# Patient Record
Sex: Female | Born: 1945 | Race: White | Hispanic: No | Smoking: Never smoker
Health system: Southern US, Community
[De-identification: ages and names within clinical notes are randomized; demographics above are authoritative.]

## PROBLEM LIST (undated history)

## (undated) DIAGNOSIS — I499 Cardiac arrhythmia, unspecified: Secondary | ICD-10-CM

## (undated) DIAGNOSIS — M1990 Unspecified osteoarthritis, unspecified site: Secondary | ICD-10-CM

## (undated) DIAGNOSIS — I10 Essential (primary) hypertension: Secondary | ICD-10-CM

## (undated) DIAGNOSIS — D649 Anemia, unspecified: Secondary | ICD-10-CM

## (undated) DIAGNOSIS — F419 Anxiety disorder, unspecified: Secondary | ICD-10-CM

## (undated) DIAGNOSIS — H269 Unspecified cataract: Secondary | ICD-10-CM

## (undated) DIAGNOSIS — M810 Age-related osteoporosis without current pathological fracture: Secondary | ICD-10-CM

## (undated) HISTORY — PX: TONSILLECTOMY: SUR1361

## (undated) HISTORY — DX: Anemia, unspecified: D64.9

## (undated) HISTORY — DX: Unspecified cataract: H26.9

## (undated) HISTORY — DX: Unspecified osteoarthritis, unspecified site: M19.90

## (undated) HISTORY — DX: Essential (primary) hypertension: I10

## (undated) HISTORY — DX: Cardiac arrhythmia, unspecified: I49.9

## (undated) HISTORY — PX: ABDOMINAL HYSTERECTOMY: SHX81

## (undated) HISTORY — PX: APPENDECTOMY: SHX54

## (undated) HISTORY — DX: Age-related osteoporosis without current pathological fracture: M81.0

## (undated) HISTORY — DX: Anxiety disorder, unspecified: F41.9

## (undated) HISTORY — PX: KNEE ARTHROSCOPY: SUR90

---

## 2010-08-01 MED ORDER — TRIAMCINOLONE ACETONIDE-L.S.B. 0.1 % TOPICAL OINTMENT
0.1 % | CUTANEOUS | Status: DC
Start: 2010-08-01 — End: 2013-07-30

## 2010-08-01 NOTE — ED Provider Notes (Signed)
Patient is a 65 y.o. female presenting with Poison Ivy. The history is provided by the patient.   Poison Ivy/Poison Oak/Poison [place]ac Exposure   This is a new problem. The current episode started yesterday. The problem has been gradually worsening. The problem is associated with nothing. There has been no fever. The rash is present on the left wrist and right wrist. The pain is at a severity of 7/10. The pain is moderate. The pain has been constant since onset. Associated symptoms include blisters and itching. Treatments tried: benadryl. The treatment provided mild relief.        Past Medical History   Diagnosis Date   ??? Hypertension         Past Surgical History   Procedure Date   ??? Hx gyn      hysterectomy         No family history on file.     History     Social History   ??? Marital Status: Married     Spouse Name: N/A     Number of Children: N/A   ??? Years of Education: N/A     Occupational History   ??? Not on file.     Social History Main Topics   ??? Smoking status: Never Smoker    ??? Smokeless tobacco: Not on file   ??? Alcohol Use: Yes   ??? Drug Use:    ??? Sexually Active:      Other Topics Concern   ??? Not on file     Social History Narrative   ??? No narrative on file                  ALLERGIES: Other and Sulfa(sulfonamide antibiotics)      Review of Systems   Constitutional: Negative.  Negative for fever and chills.   HENT: Negative.  Negative for ear pain, congestion, sore throat, rhinorrhea, sneezing, drooling, mouth sores, trouble swallowing, neck pain, neck stiffness, voice change, postnasal drip and sinus pressure.    Eyes: Negative.  Negative for pain and redness.   Respiratory: Negative.  Negative for cough, chest tightness and shortness of breath.    Cardiovascular: Negative.  Negative for chest pain, palpitations and leg swelling.   Gastrointestinal: Negative.  Negative for nausea, vomiting, abdominal pain, diarrhea and constipation.   Genitourinary: Negative.  Negative for dysuria and hematuria.    Musculoskeletal: Negative.  Negative for myalgias and arthralgias.   Skin: Positive for itching and rash.   Neurological: Negative.  Negative for dizziness, light-headedness and headaches.   Psychiatric/Behavioral: Negative.    [all other systems reviewed and are negative        Filed Vitals:    08/01/10 1127   BP: 146/86   Pulse: 68   Temp: 97.5 ??F (36.4 ??C)   Resp: 20   Height: 5' 3.5" (1.613 m)   Weight: 120 lb (54.432 kg)   SpO2: 99%            Physical Exam   [nursing notereviewed.  Constitutional: She is oriented to person, place, and time. She appears well-developed and well-nourished. No distress.   HENT:   Head: Normocephalic and atraumatic.   Eyes: Conjunctivae are normal.   Neck: Normal range of motion. Neck supple.   Cardiovascular: Normal rate, regular rhythm, normal heart sounds and intact distal pulses.    Pulmonary/Chest: Effort normal and breath sounds normal. No respiratory distress.   Abdominal: Soft. Bowel sounds are normal. There is no tenderness.  Musculoskeletal: Normal range of motion.   Neurological: She is alert and oriented to person, place, and time.   Skin: Skin is warm and dry. She is not diaphoretic.        Psychiatric: She has a normal mood and affect. Her behavior is normal.        MDM     Amount and/or Complexity of Data Reviewed:   Discussion of test results with the performing providers:  No   Decide to obtain previous medical records or to obtain history from someone other than the patient:  No   Obtain history from someone other than the patient:  No   Review and summarize past medical records:  No   Discuss the patient with another provider:  No   Independant visualization of image, tracing, or specimen:  No  Risk of Significant Complications, Morbidity, and/or Mortality:   Presenting problems:  [low  Diagnostic procedures:  [low  Management options:  [low  Progress:   Patient progress:  [stable      Procedures

## 2010-08-01 NOTE — ED Notes (Signed)
Pt given discharge instructions and prescriptions; verbalized understanding; e-signed out; ambulated out of ER

## 2013-07-30 NOTE — ED Notes (Signed)
Patient states she felt a flutter in her chest, has had periods of a-fib in the past with rvr. Patient denied SOB, C/P, or N/V.

## 2013-07-30 NOTE — ED Notes (Signed)
Patient continues to feel better than on admission. The fluttering is now gone, patient did state that she felt a little dizzy but it wasn't bad.

## 2013-07-30 NOTE — ED Notes (Signed)
Nursing supervisor is going to bring the medication down from ICU pxyis.

## 2013-07-30 NOTE — ED Notes (Signed)
I have reviewed discharge instructions with the patient.  The spouse verbalized understanding.

## 2013-07-30 NOTE — ED Notes (Signed)
Patient was given prescribed dose of diltiazem, heart rate decreased without patient symptoms. Repeat EKG ran with new rate.

## 2013-07-30 NOTE — ED Notes (Signed)
Pt converted after Cardizem.   EKG: NSR, 81, NAP.   Case discussed with Dr. Daleen Snookebe, rec: Xarelto and call office for follow up

## 2013-07-30 NOTE — ED Provider Notes (Signed)
HPI Comments: Pt reports episodes of "fluttering" heart beats that have been occurring about once a month for the past several years, but not lasting more than 24 hours. Her most recent episode before today was 2 days ago when she was feeling fatigued all day and collapsed suddenly while moving a mattress with her husband.  She was fine yesterday and then noted the irregular heartbeat return today.  It has lasted all day long and then this evening she had a brief syncopal episode while standing in the kitchen.  This was witnessed by her husband and there was possibly only transient LOC. She denies chest pain, dyspnea, fever or Hx of thyroid problems.     Patient is a 68 y.o. female presenting with palpitations. The history is provided by the patient.   Palpitations   This is a recurrent problem. The current episode started 12 to 24 hours ago. The problem has not changed since onset.The problem occurs constantly. The problem is associated with an unknown factor. Associated symptoms include malaise/fatigue, irregular heartbeat and syncope. Pertinent negatives include no diaphoresis, no fever, no numbness, no chest pain, no chest pressure, no claudication, no exertional chest pressure, no near-syncope, no orthopnea, no PND, no abdominal pain, no nausea, no vomiting, no headaches, no back pain, no leg pain, no lower extremity edema, no dizziness, no weakness, no cough, no hemoptysis, no shortness of breath and no sputum production. Risk factors include hypertension. She has tried nothing for the symptoms. The treatment provided no relief. Her past medical history is significant for hypertension. Her past medical history does not include hyperthyroidism or heart disease.        Past Medical History   Diagnosis Date   ??? Hypertension    ??? Arthritis         Past Surgical History   Procedure Laterality Date   ??? Hx gyn       hysterectomy   ??? Hx tonsillectomy           History reviewed. No pertinent family history.     History      Social History   ??? Marital Status: MARRIED     Spouse Name: N/A     Number of Children: N/A   ??? Years of Education: N/A     Occupational History   ??? Not on file.     Social History Main Topics   ??? Smoking status: Never Smoker    ??? Smokeless tobacco: Not on file   ??? Alcohol Use: Yes      Comment: wine qd   ??? Drug Use: No   ??? Sexual Activity: Not on file     Other Topics Concern   ??? Not on file     Social History Narrative                  ALLERGIES: Other medication and Sulfa (sulfonamide antibiotics)      Review of Systems   Constitutional: Positive for malaise/fatigue. Negative for fever and diaphoresis.   Respiratory: Negative for cough, hemoptysis, sputum production and shortness of breath.    Cardiovascular: Positive for palpitations and syncope. Negative for chest pain, orthopnea, claudication, PND and near-syncope.   Gastrointestinal: Negative for nausea, vomiting and abdominal pain.   Musculoskeletal: Negative for back pain.   Neurological: Negative for dizziness, weakness, numbness and headaches.   All other systems reviewed and are negative.      Filed Vitals:    07/30/13 2038   BP: 163/103  Pulse: 121   Temp: 97.7 ??F (36.5 ??C)   Resp: 20   Height: 5' 3.5" (1.613 m)   Weight: 51.71 kg (114 lb)   SpO2: 98%            Physical Exam   Constitutional: She is oriented to person, place, and time. She appears well-developed and well-nourished. No distress.   HENT:   Head: Normocephalic and atraumatic.   Mouth/Throat: No oropharyngeal exudate.   Eyes: Conjunctivae are normal. Pupils are equal, round, and reactive to light.   Neck: Normal range of motion. Neck supple.   Cardiovascular: Normal rate, regular rhythm and normal heart sounds.    Pulmonary/Chest: Effort normal and breath sounds normal.   Abdominal: Soft. Bowel sounds are normal.   Musculoskeletal: Normal range of motion. She exhibits no edema or tenderness.   Neurological: She is alert and oriented to person, place, and time.   Skin: Skin is warm and  dry.   Psychiatric: She has a normal mood and affect. Her behavior is normal.   Nursing note and vitals reviewed.       MDM  Number of Diagnoses or Management Options  Diagnosis management comments: EKG: Atrial fibrillation, RVR; 133; no STEMI       Amount and/or Complexity of Data Reviewed  Clinical lab tests: ordered and reviewed  Tests in the radiology section of CPT??: ordered and reviewed  Review and summarize past medical records: yes  Discuss the patient with other providers: yes  Independent visualization of images, tracings, or specimens: yes    Risk of Complications, Morbidity, and/or Mortality  Presenting problems: high  Diagnostic procedures: high  Management options: high    Patient Progress  Patient progress: improved      Procedures

## 2013-07-31 LAB — METABOLIC PANEL, COMPREHENSIVE
A-G Ratio: 1.5 (ref 1.2–3.5)
ALT (SGPT): 24 U/L (ref 12–65)
AST (SGOT): 18 U/L (ref 15–37)
Albumin: 4.3 g/dL (ref 3.2–4.6)
Alk. phosphatase: 51 U/L (ref 50–136)
Anion gap: 10 mmol/L (ref 7–16)
BUN: 9 MG/DL (ref 8–23)
Bilirubin, total: 0.3 MG/DL (ref 0.2–1.1)
CO2: 30 mmol/L (ref 21–32)
Calcium: 8.8 MG/DL (ref 8.3–10.4)
Chloride: 100 mmol/L (ref 98–107)
Creatinine: 0.6 MG/DL (ref 0.6–1.0)
GFR est AA: >60 mL/min/{1.73_m2} (ref >60)
GFR est non-AA: >60 mL/min/{1.73_m2} (ref >60)
Globulin: 2.8 g/dL (ref 2.3–3.5)
Glucose: 100 mg/dL (ref 65–100)
Potassium: 3.9 mmol/L (ref 3.5–5.1)
Protein, total: 7.1 g/dL (ref 6.3–8.2)
Sodium: 140 mmol/L (ref 136–145)

## 2013-07-31 LAB — EKG, 12 LEAD, INITIAL
Atrial Rate: 267 {beats}/min
Atrial Rate: 81 {beats}/min
Calculated P Axis: 38 degrees
Calculated R Axis: 73 degrees
Calculated R Axis: 72 degrees
Calculated T Axis: 63 degrees
Calculated T Axis: 64 degrees
P-R Interval: 186 ms
Q-T Interval: 270 ms
Q-T Interval: 348 ms
QRS Duration: 74 ms
QRS Duration: 74 ms
QTC Calculation (Bezet): 401 ms
QTC Calculation (Bezet): 404 ms
Ventricular Rate: 133 {beats}/min
Ventricular Rate: 81 {beats}/min

## 2013-07-31 LAB — MAGNESIUM: Magnesium: 2.1 mg/dL (ref 1.8–2.4)

## 2013-07-31 LAB — CBC WITH AUTOMATED DIFF
ABS. BASOPHILS: 0.1 10*3/uL (ref 0.0–0.2)
ABS. EOSINOPHILS: 0.1 10*3/uL (ref 0.0–0.8)
ABS. IMM. GRANS.: 0 10*3/uL (ref 0.0–0.5)
ABS. LYMPHOCYTES: 1.7 10*3/uL (ref 0.5–4.6)
ABS. MONOCYTES: 0.8 10*3/uL (ref 0.1–1.3)
ABS. NEUTROPHILS: 4.9 10*3/uL (ref 1.7–8.2)
BASOPHILS: 1 % (ref 0.0–2.0)
EOSINOPHILS: 1 % (ref 0.5–7.8)
HCT: 37.8 % (ref 35.8–46.3)
HGB: 12.8 g/dL (ref 11.7–15.4)
IMMATURE GRANULOCYTES: 0.1 % (ref 0.0–5.0)
LYMPHOCYTES: 23 % (ref 13–44)
MCH: 30.5 PG (ref 26.1–32.9)
MCHC: 33.9 g/dL (ref 31.4–35.0)
MCV: 90 FL (ref 79.6–97.8)
MONOCYTES: 10 % (ref 4.0–12.0)
MPV: 9.7 FL — ABNORMAL LOW (ref 10.8–14.1)
NEUTROPHILS: 65 % (ref 43–78)
PLATELET: 270 10*3/uL (ref 150–450)
RBC: 4.2 M/uL (ref 4.05–5.25)
RDW: 13 % (ref 11.9–14.6)
WBC: 7.5 10*3/uL (ref 4.3–11.1)

## 2013-07-31 LAB — POC TROPONIN: Troponin-I (POC): 0.01 ng/ml (ref 0.0–0.08)

## 2013-07-31 LAB — TSH 3RD GENERATION: TSH: 4.453 u[IU]/mL — ABNORMAL HIGH (ref 0.358–3.740)

## 2013-07-31 MED ORDER — RIVAROXABAN 10 MG TAB
10 mg | ORAL_TABLET | Freq: Every day | ORAL | Status: DC
Start: 2013-07-31 — End: 2013-08-13

## 2013-07-31 MED ORDER — DILTIAZEM HCL 5 MG/ML IV SOLN
5 mg/mL | INTRAVENOUS | Status: AC
Start: 2013-07-31 — End: 2013-07-30
  Administered 2013-07-31: 02:00:00 via INTRAVENOUS

## 2013-07-31 MED ORDER — SODIUM CHLORIDE 0.9 % IJ SYRG
INTRAMUSCULAR | Status: DC | PRN
Start: 2013-07-31 — End: 2013-07-31

## 2013-07-31 MED ORDER — ASPIRIN 325 MG TAB
325 mg | ORAL | Status: AC
Start: 2013-07-31 — End: 2013-07-30
  Administered 2013-07-31: 02:00:00 via ORAL

## 2013-07-31 MED ADMIN — rivaroxaban (XARELTO) tablet 15 mg: ORAL | @ 03:00:00 | NDC 50458057810

## 2013-07-31 MED FILL — XARELTO 15 MG TABLET: 15 mg | ORAL | Qty: 1

## 2013-07-31 MED FILL — ASPIRIN 325 MG TAB: 325 mg | ORAL | Qty: 1

## 2013-07-31 MED FILL — DILTIAZEM HCL 5 MG/ML IV SOLN: 5 mg/mL | INTRAVENOUS | Qty: 10

## 2013-08-05 LAB — CBC WITH AUTOMATED DIFF
ABS. BASOPHILS: 0 10*3/uL (ref 0.0–0.2)
ABS. EOSINOPHILS: 0.1 10*3/uL (ref 0.0–0.8)
ABS. IMM. GRANS.: 0 10*3/uL (ref 0.0–0.5)
ABS. LYMPHOCYTES: 1.7 10*3/uL (ref 0.5–4.6)
ABS. MONOCYTES: 0.7 10*3/uL (ref 0.1–1.3)
ABS. NEUTROPHILS: 5.5 10*3/uL (ref 1.7–8.2)
BASOPHILS: 1 % (ref 0.0–2.0)
EOSINOPHILS: 1 % (ref 0.5–7.8)
HCT: 37.9 % (ref 35.8–46.3)
HGB: 13 g/dL (ref 11.7–15.4)
IMMATURE GRANULOCYTES: 0.4 % (ref 0.0–5.0)
LYMPHOCYTES: 21 % (ref 13–44)
MCH: 30.9 PG (ref 26.1–32.9)
MCHC: 34.3 g/dL (ref 31.4–35.0)
MCV: 90 FL (ref 79.6–97.8)
MONOCYTES: 9 % (ref 4.0–12.0)
MPV: 10.2 FL — ABNORMAL LOW (ref 10.8–14.1)
NEUTROPHILS: 68 % (ref 43–78)
PLATELET: 258 10*3/uL (ref 150–450)
RBC: 4.21 M/uL (ref 4.05–5.25)
RDW: 12.7 % (ref 11.9–14.6)
WBC: 8.1 10*3/uL (ref 4.3–11.1)

## 2013-08-05 LAB — METABOLIC PANEL, COMPREHENSIVE
A-G Ratio: 1.5 (ref 1.2–3.5)
ALT (SGPT): 25 U/L (ref 12–65)
AST (SGOT): 21 U/L (ref 15–37)
Albumin: 4.7 g/dL — ABNORMAL HIGH (ref 3.2–4.6)
Alk. phosphatase: 57 U/L (ref 50–136)
Anion gap: 10 mmol/L (ref 7–16)
BUN: 8 MG/DL (ref 8–23)
Bilirubin, total: 0.4 MG/DL (ref 0.2–1.1)
CO2: 30 mmol/L (ref 21–32)
Calcium: 9.3 MG/DL (ref 8.3–10.4)
Chloride: 95 mmol/L — ABNORMAL LOW (ref 98–107)
Creatinine: 0.8 MG/DL (ref 0.6–1.0)
GFR est AA: >60 mL/min/{1.73_m2} (ref >60)
GFR est non-AA: >60 mL/min/{1.73_m2} (ref >60)
Globulin: 3.1 g/dL (ref 2.3–3.5)
Glucose: 105 mg/dL — ABNORMAL HIGH (ref 65–100)
Potassium: 3.3 mmol/L — ABNORMAL LOW (ref 3.5–5.1)
Protein, total: 7.8 g/dL (ref 6.3–8.2)
Sodium: 135 mmol/L — ABNORMAL LOW (ref 136–145)

## 2013-08-05 LAB — POC TROPONIN: Troponin-I (POC): 0 ng/ml (ref 0.0–0.08)

## 2013-08-05 LAB — EKG, 12 LEAD, INITIAL
Atrial Rate: 72 {beats}/min
Calculated P Axis: 54 degrees
Calculated R Axis: 65 degrees
Calculated T Axis: 67 degrees
P-R Interval: 156 ms
Q-T Interval: 386 ms
QRS Duration: 76 ms
QTC Calculation (Bezet): 422 ms
Ventricular Rate: 72 {beats}/min

## 2013-08-05 LAB — PROTHROMBIN TIME + INR
INR: 1.1 (ref 0.9–1.2)
Prothrombin time: 11.3 s (ref 9.6–12.0)

## 2013-08-05 LAB — PTT: aPTT: 30.6 s (ref 25.3–32.9)

## 2013-08-05 MED ORDER — METOPROLOL TARTRATE 50 MG TAB
50 mg | ORAL_TABLET | Freq: Two times a day (BID) | ORAL | Status: DC
Start: 2013-08-05 — End: 2013-08-13

## 2013-08-05 MED ORDER — SODIUM CHLORIDE 0.9 % IJ SYRG
INTRAMUSCULAR | Status: DC | PRN
Start: 2013-08-05 — End: 2013-08-05

## 2013-08-05 NOTE — ED Provider Notes (Addendum)
HPI Comments: Leah Mueller with history of paroxysmal atrial fib off and on for possibly a year. Leah Mueller was seen on the 6th here and the diagnosis was made and Leah Mueller followed up with Dr Roseanne RenoStewart and had an echocardiogram this week and is scheduled for a stress test next week. He reduced her metoprolol from 50/12.5 BID to 25mg  BID. Today Leah Mueller was sitting reading and the irregular heartbeat started again and by the time ems arrived it stopped and in the ed Leah Mueller is in SR. Leah Mueller is on xeralto.    Patient is a 68 y.o. female presenting with palpitations. The history is provided by the patient and the spouse.   Palpitations          Past Medical History   Diagnosis Date   ??? Hypertension    ??? Arthritis         Past Surgical History   Procedure Laterality Date   ??? Hx gyn       hysterectomy   ??? Hx tonsillectomy           History reviewed. No pertinent family history.     History     Social History   ??? Marital Status: MARRIED     Spouse Name: N/A     Number of Children: N/A   ??? Years of Education: N/A     Occupational History   ??? Not on file.     Social History Main Topics   ??? Smoking status: Never Smoker    ??? Smokeless tobacco: Not on file   ??? Alcohol Use: Yes      Comment: wine qd   ??? Drug Use: No   ??? Sexual Activity: Not on file     Other Topics Concern   ??? Not on file     Social History Narrative                  ALLERGIES: Other medication and Sulfa (sulfonamide antibiotics)      Review of Systems   Cardiovascular: Positive for palpitations.   All other systems reviewed and are negative.      Filed Vitals:    08/05/13 1225   BP: 167/88   Pulse: 74   Temp: 97.8 ??F (36.6 ??C)   Resp: 20   Height: 5' 3.5" (1.613 m)   Weight: 50.803 kg (112 lb)   SpO2: 99%            Physical Exam   Constitutional: Leah Mueller is oriented to person, place, and time. Leah Mueller appears well-developed and well-nourished.   HENT:   Head: Normocephalic and atraumatic.   Right Ear: External ear normal.   Left Ear: External ear normal.   Nose: Nose normal.   Mouth/Throat:  Oropharynx is clear and moist.   Eyes: Conjunctivae and EOM are normal. Pupils are equal, round, and reactive to light. Right eye exhibits no discharge. Left eye exhibits no discharge.   Neck: Normal range of motion. Neck supple. No thyromegaly present.   Cardiovascular: Normal rate, regular rhythm, normal heart sounds and intact distal pulses.  Exam reveals no gallop and no friction rub.    No murmur heard.  Pulmonary/Chest: Effort normal and breath sounds normal. No respiratory distress. Leah Mueller has no wheezes. Leah Mueller has no rales. Leah Mueller exhibits no tenderness.   Abdominal: Soft. Leah Mueller exhibits no distension. There is no tenderness.   Musculoskeletal: Normal range of motion. Leah Mueller exhibits no edema or tenderness.   Neurological: Leah Mueller is alert and oriented to  person, place, and time. Leah Mueller displays normal reflexes. No cranial nerve deficit. Leah Mueller exhibits normal muscle tone. Coordination normal.   Skin: Skin is warm and dry.   Nursing note and vitals reviewed.       MDM    Procedures

## 2013-08-05 NOTE — ED Notes (Signed)
I have reviewed discharge instructions with the patient.  The patient verbalized understanding. Patient was given prescription. Pt ambulatory upon discharge home.

## 2013-08-05 NOTE — ED Notes (Signed)
I discussed with Dr Theressa MillardNessmith . Will increase the metoprolol th 50mg  BID. The patient will monitor the blood pressure.

## 2013-08-13 NOTE — ED Notes (Signed)
Alert and skin warm and dry, no complaints of chest pain or shortness of breath, husband at bedside

## 2013-08-13 NOTE — ED Notes (Signed)
Alert and skin warm and dry, Resting quietly and currently no complaints of chest pain or shortness of breath, IV site patent and husband at bedside

## 2013-08-13 NOTE — ED Provider Notes (Signed)
HPI Comments: [age]y female with newly onset afib having RVR which started at noon. She denies CP or SOB. She had been on Metoprolol initially but it was stopped. She is on Eliquis and HCTZ.    Patient is a 68 y.o. female presenting with palpitations. The history is provided by the patient.   Palpitations   This is a recurrent problem. The current episode started 6 to 12 hours ago. The problem has not changed since onset.The problem occurs constantly. The problem is associated with nothing. Associated symptoms include irregular heartbeat. Pertinent negatives include no diaphoresis, no fever, no malaise/fatigue, no numbness, no chest pain, no chest pressure, no exertional chest pressure, no orthopnea, no syncope, no abdominal pain, no vomiting, no back pain, no dizziness, no cough and no sputum production. Risk factors include cardiac disease. She has tried nothing for the symptoms. Her past medical history is significant for atrial fibrillation and SVT. Her past medical history does not include hyperthyroidism or valve disorder.        Past Medical History   Diagnosis Date   ??? Hypertension    ??? Arthritis    ??? Ill-defined condition         Past Surgical History   Procedure Laterality Date   ??? Hx gyn       hysterectomy   ??? Hx tonsillectomy           No family history on file.     History     Social History   ??? Marital Status: MARRIED     Spouse Name: N/A     Number of Children: N/A   ??? Years of Education: N/A     Occupational History   ??? Not on file.     Social History Main Topics   ??? Smoking status: Never Smoker    ??? Smokeless tobacco: Not on file   ??? Alcohol Use: Yes      Comment: wine qd   ??? Drug Use: No   ??? Sexual Activity: Not on file     Other Topics Concern   ??? Not on file     Social History Narrative                  ALLERGIES: Other medication and Sulfa (sulfonamide antibiotics)      Review of Systems   Constitutional: Negative.  Negative for fever, malaise/fatigue, diaphoresis and activity change.   HENT:  Negative.    Eyes: Negative.    Respiratory: Negative.  Negative for cough and sputum production.    Cardiovascular: Positive for palpitations. Negative for chest pain, orthopnea and syncope.   Gastrointestinal: Negative.  Negative for vomiting and abdominal pain.   Genitourinary: Negative.    Musculoskeletal: Negative.  Negative for back pain.   Skin: Negative.    Neurological: Negative.  Negative for dizziness and numbness.   Psychiatric/Behavioral: Negative.    All other systems reviewed and are negative.      Filed Vitals:    08/13/13 2022 08/13/13 2038   BP: 158/94 159/90   Pulse: 118 129   Temp: 98.1 ??F (36.7 ??C)    Resp: 16 16   Height: 5' 3.5" (1.613 m)    Weight: 49.896 kg (110 lb)    SpO2: 96% 100%            Physical Exam   Constitutional: She is oriented to person, place, and time. She appears well-developed and well-nourished. No distress.   HENT:   Head: Normocephalic  and atraumatic.   Right Ear: External ear normal.   Left Ear: External ear normal.   Nose: Nose normal.   Mouth/Throat: Oropharynx is clear and moist. No oropharyngeal exudate.   Eyes: Conjunctivae and EOM are normal. Pupils are equal, round, and reactive to light. Right eye exhibits no discharge. Left eye exhibits no discharge. No scleral icterus.   Neck: Normal range of motion. Neck supple. No JVD present. No tracheal deviation present.   Cardiovascular: Intact distal pulses.  An irregularly irregular rhythm present. Tachycardia present.    No murmur heard.  Pulmonary/Chest: Effort normal and breath sounds normal. No stridor. No respiratory distress. She has no wheezes. She exhibits no tenderness.   Abdominal: Soft. Bowel sounds are normal. She exhibits no distension and no mass. There is no tenderness.   Musculoskeletal: Normal range of motion. She exhibits no edema or tenderness.   Neurological: She is alert and oriented to person, place, and time. No cranial nerve deficit.   Skin: Skin is warm and dry. No rash noted. She is not  diaphoretic. No erythema. No pallor.   Psychiatric: She has a normal mood and affect. Her behavior is normal. Thought content normal.   Nursing note and vitals reviewed.       MDM  Number of Diagnoses or Management Options     Amount and/or Complexity of Data Reviewed  Clinical lab tests: ordered and reviewed  Tests in the radiology section of CPT??: ordered and reviewed  Tests in the medicine section of CPT??: ordered and reviewed        Procedures

## 2013-08-14 LAB — POC TROPONIN
Troponin-I (POC): 0.03 ng/ml (ref 0.0–0.08)
Troponin-I (POC): 0.02 ng/ml (ref 0.0–0.08)

## 2013-08-14 LAB — CBC WITH AUTOMATED DIFF
ABS. BASOPHILS: 0 10*3/uL (ref 0.0–0.2)
ABS. EOSINOPHILS: 0.1 10*3/uL (ref 0.0–0.8)
ABS. IMM. GRANS.: 0 10*3/uL (ref 0.0–0.5)
ABS. LYMPHOCYTES: 2.1 10*3/uL (ref 0.5–4.6)
ABS. MONOCYTES: 0.9 10*3/uL (ref 0.1–1.3)
ABS. NEUTROPHILS: 5.9 10*3/uL (ref 1.7–8.2)
BASOPHILS: 0 % (ref 0.0–2.0)
EOSINOPHILS: 1 % (ref 0.5–7.8)
HCT: 39.8 % (ref 35.8–46.3)
HGB: 13.9 g/dL (ref 11.7–15.4)
IMMATURE GRANULOCYTES: 0.2 % (ref 0.0–5.0)
LYMPHOCYTES: 23 % (ref 13–44)
MCH: 30.9 PG (ref 26.1–32.9)
MCHC: 34.9 g/dL (ref 31.4–35.0)
MCV: 88.4 FL (ref 79.6–97.8)
MONOCYTES: 10 % (ref 4.0–12.0)
MPV: 10.4 FL — ABNORMAL LOW (ref 10.8–14.1)
NEUTROPHILS: 66 % (ref 43–78)
PLATELET: 299 10*3/uL (ref 150–450)
RBC: 4.5 M/uL (ref 4.05–5.25)
RDW: 12.6 % (ref 11.9–14.6)
WBC: 9.1 10*3/uL (ref 4.3–11.1)

## 2013-08-14 LAB — MAGNESIUM: Magnesium: 1.9 mg/dL (ref 1.8–2.4)

## 2013-08-14 LAB — METABOLIC PANEL, COMPREHENSIVE
A-G Ratio: 1.3 (ref 1.2–3.5)
ALT (SGPT): 22 U/L (ref 12–65)
AST (SGOT): 26 U/L (ref 15–37)
Albumin: 4.5 g/dL (ref 3.2–4.6)
Alk. phosphatase: 56 U/L (ref 50–136)
Anion gap: 11 mmol/L (ref 7–16)
BUN: 5 MG/DL — ABNORMAL LOW (ref 8–23)
Bilirubin, total: 0.5 MG/DL (ref 0.2–1.1)
CO2: 28 mmol/L (ref 21–32)
Calcium: 8.5 MG/DL (ref 8.3–10.4)
Chloride: 94 mmol/L — ABNORMAL LOW (ref 98–107)
Creatinine: 0.6 MG/DL (ref 0.6–1.0)
GFR est AA: >60 mL/min/{1.73_m2} (ref >60)
GFR est non-AA: >60 mL/min/{1.73_m2} (ref >60)
Globulin: 3.4 g/dL (ref 2.3–3.5)
Glucose: 106 mg/dL — ABNORMAL HIGH (ref 65–100)
Potassium: 3.9 mmol/L (ref 3.5–5.1)
Protein, total: 7.9 g/dL (ref 6.3–8.2)
Sodium: 133 mmol/L — ABNORMAL LOW (ref 136–145)

## 2013-08-14 LAB — EKG, 12 LEAD, INITIAL
Atrial Rate: 153 {beats}/min
Calculated P Axis: 45 degrees
Calculated R Axis: 82 degrees
Calculated T Axis: 52 degrees
P-R Interval: 174 ms
Q-T Interval: 328 ms
QRS Duration: 78 ms
QTC Calculation (Bezet): 484 ms
Ventricular Rate: 131 {beats}/min

## 2013-08-14 LAB — BNP: BNP: 79 pg/mL

## 2013-08-14 LAB — PTT: aPTT: 29.5 s (ref 25.3–32.9)

## 2013-08-14 LAB — PROTHROMBIN TIME + INR
INR: 1 (ref 0.9–1.2)
Prothrombin time: 10.8 s (ref 9.6–12.0)

## 2013-08-14 LAB — TSH 3RD GENERATION: TSH: 4.078 u[IU]/mL — ABNORMAL HIGH (ref 0.358–3.740)

## 2013-08-14 MED ORDER — SODIUM CHLORIDE 0.9% BOLUS IV
0.9 % | Freq: Once | INTRAVENOUS | Status: AC
Start: 2013-08-14 — End: 2013-08-14
  Administered 2013-08-14: 01:00:00 via INTRAVENOUS

## 2013-08-14 MED ORDER — METOPROLOL SUCCINATE SR 50 MG 24 HR TAB
50 mg | ORAL_TABLET | Freq: Every day | ORAL | Status: AC
Start: 2013-08-14 — End: ?

## 2013-08-14 MED ORDER — METOPROLOL TARTRATE 50 MG TAB
50 mg | ORAL | Status: AC
Start: 2013-08-14 — End: 2013-08-13
  Administered 2013-08-14: 01:00:00 via ORAL

## 2013-08-14 MED FILL — METOPROLOL TARTRATE 50 MG TAB: 50 mg | ORAL | Qty: 1

## 2013-08-14 NOTE — ED Notes (Signed)
The patient and spouse was given their discharge instructions and  was given prescriptions.   The  patient verbalized understanding and had no additional questions. The patient was alert and was discharged via Ambulatory, without additional complaints at time of discharge.  No apparent distress noted

## 2014-08-20 DIAGNOSIS — E871 Hypo-osmolality and hyponatremia: Secondary | ICD-10-CM | POA: Insufficient documentation

## 2014-08-20 DIAGNOSIS — F419 Anxiety disorder, unspecified: Secondary | ICD-10-CM | POA: Insufficient documentation

## 2014-08-20 HISTORY — DX: Hypo-osmolality and hyponatremia: E87.1

## 2015-06-18 DIAGNOSIS — M810 Age-related osteoporosis without current pathological fracture: Secondary | ICD-10-CM | POA: Insufficient documentation

## 2015-11-11 DIAGNOSIS — I10 Essential (primary) hypertension: Secondary | ICD-10-CM | POA: Insufficient documentation

## 2016-09-28 ENCOUNTER — Ambulatory Visit (INDEPENDENT_AMBULATORY_CARE_PROVIDER_SITE_OTHER): Payer: Medicare Other | Admitting: Pediatrics

## 2016-09-28 ENCOUNTER — Encounter: Payer: Self-pay | Admitting: Pediatrics

## 2016-09-28 VITALS — BP 197/91 | HR 58 | Temp 97.0°F | Ht 63.0 in | Wt 113.6 lb

## 2016-09-28 DIAGNOSIS — F419 Anxiety disorder, unspecified: Secondary | ICD-10-CM | POA: Diagnosis not present

## 2016-09-28 DIAGNOSIS — E785 Hyperlipidemia, unspecified: Secondary | ICD-10-CM | POA: Diagnosis not present

## 2016-09-28 DIAGNOSIS — I10 Essential (primary) hypertension: Secondary | ICD-10-CM

## 2016-09-28 DIAGNOSIS — I4891 Unspecified atrial fibrillation: Secondary | ICD-10-CM | POA: Insufficient documentation

## 2016-09-28 MED ORDER — ALPRAZOLAM 0.25 MG PO TABS: ORAL_TABLET | ORAL | 2 refills | Status: DC

## 2016-09-28 MED ORDER — PRAVASTATIN SODIUM 20 MG PO TABS: 20.0000 mg | ORAL_TABLET | Freq: Every day | ORAL | 3 refills | Status: DC

## 2016-09-28 NOTE — Progress Notes (Signed)
Subjective:   Patient ID: Mary [name], female    DOB: Apr 28, 1945, 71 y.o.   MRN: [id number] CC: New Patient (Initial Visit) and med follow up HPI: Mary [name] is a 71 y.o. female presenting for New Patient (Initial Visit)  Here today with her husband  HTN: taking BPs at home, regulalry elevated to the 180s Has had problems with BP since her 35s Had hyponatremia with HCTZ in the past Recently valsartan doubled to 320mg  in the morning, took last night for the first time  Afib: xarelto made her sick to her stomach, eliquis was $100 a month Planning to check with insurance co about pradaxa She doesn't want the inconvenience of checking INRs with warfarin Follows with cardiology  Anxiety: taking xanax for about a year Has helped her feel more calm Was tried on sertraline initially, made her feel nauseous  Recently seen by dentist, 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 Anxiety   . Arthritis   . Hypertension   . Irregular heart beat    Family History  Problem Relation Age of Onset  . Arthritis Mother   . Heart disease Mother   . Hypertension Mother   . Hyperlipidemia Mother   . Kidney disease Mother   . Hypertension Brother   . Stroke Maternal Grandmother   . Heart disease Maternal Grandfather   . Arthritis Paternal Grandmother    Social History   Social History  . Marital status: Married    Spouse name: N/A  . Number of children: N/A  . Years of education: N/A   Social History Main Topics  . Smoking status: Never Smoker  . Smokeless tobacco: Never Used  . Alcohol use 4.2 oz/week    7 Glasses of wine per week  . Drug use: No  . Sexual activity: No   Other Topics Concern  . None   Social History Narrative  . None   ROS: All systems negative other than what is in HPI  Objective:    BP (!) 197/91   Pulse (!) 58   Temp 97 F (36.1 C) (Oral)   Ht  5\' 3"  (1.6 m)   Wt 113 lb 9.6 oz (51.5 kg)   BMI 20.12 kg/m   Wt Readings from Last 3 Encounters:  09/28/16 113 lb 9.6 oz (51.5 kg)    Gen: NAD, thin, well appearing, alert, cooperative with exam EYES: EOMI, no conjunctival injection, or no icterus ENT:  L TM injected, R Tm normal,  OP without erythema LYMPH: no cervical LAD CV: NRRR, normal S1/S2, no murmur, distal pulses 2+ b/l Resp: CTABL, no wheezes, normal WOB Abd: +BS, soft, NTND. No abd bruits, no guarding or organomegaly Ext: No edema, warm Neuro: Alert and oriented, strength equal b/l UE and LE, coordination grossly normal MSK: normal muscle bulk   Assessment & Plan:  Mary [name] was seen today for new patient (initial visit), med problem f/u.  Diagnoses and all orders for this visit:  Atrial fibrillation, unspecified type ([place]) Rhythm controlled, follows with cardiology. Ideally would be on anticoagulation, CHA2DS2/VAS at least 3, with increased risk of stroke. Pt aware. Will check pradaxa prices with insurance. Open to warfarin, pt wants to talk with insurance and think about it. Can have pt follow with clinical pharmacist here for INRs if she goes that route.  Anxiety Well controlled on below. Discussed ideally will have her on a medicine to  treat the anxiety as well. Will continue before for now, #60 tabs with 2 refills given, will cont to address. -     ALPRAZolam (XANAX) 0.25 MG tablet; TAKE 1 TABLET 2 TIMES DAILY AS NEEDED FOR ANXIETY  Hyperlipidemia, unspecified hyperlipidemia type ASCVD 21yr risk 28% with elevated BP today, still would be over 10% een with ideal BP control. Will start below -     pravastatin (PRAVACHOL) 20 MG tablet; Take 1 tablet (20 mg total) by mouth daily.  Essential hypertension Elevated. Just started extra valsartan dose last night.  Cont to check at home Let me know if stays elevated. F/u 4 weeks  Follow up plan: Return in about 4 weeks (around 10/26/2016). [name], MD [place]

## 2016-09-28 NOTE — Patient Instructions (Addendum)
Warfarin (generic) coumadin (brand name)  pradaxa  Eliquis  Starting pravastatin 20mg  at night

## 2016-10-11 ENCOUNTER — Telehealth: Payer: Self-pay | Admitting: Pediatrics

## 2016-10-11 NOTE — Telephone Encounter (Signed)
appt scheduled Pt notified 

## 2016-10-11 NOTE — Telephone Encounter (Signed)
Can you call pt to set up appt new coumadin pt appt with Tammy in the next week if possible? Pt is aware.

## 2016-10-12 ENCOUNTER — Ambulatory Visit (INDEPENDENT_AMBULATORY_CARE_PROVIDER_SITE_OTHER): Payer: Medicare Other | Admitting: Pharmacist

## 2016-10-12 VITALS — BP 148/88 | HR 60

## 2016-10-12 DIAGNOSIS — I480 Paroxysmal atrial fibrillation: Secondary | ICD-10-CM | POA: Diagnosis not present

## 2016-10-12 DIAGNOSIS — Z79899 Other long term (current) drug therapy: Secondary | ICD-10-CM

## 2016-10-12 MED ORDER — WARFARIN SODIUM 2.5 MG PO TABS: 2.5000 mg | ORAL_TABLET | Freq: Every day | ORAL | 1 refills | Status: DC

## 2016-10-12 NOTE — Progress Notes (Signed)
Subjective:     Indication: atrial fibrillation   Mary [name] is referred by Dr Evette Doffing for initiation of anticoagulation therapy.  Mary [name] has atrial fibrillation with CHADSVAsc score of 3.  She has taken Xarelto in past and caused nausea and for patient to "feel bad";  She was then switched to Eliquis and tolerated well but cost was high.  Also checked cost of Pradaxa and it too was too expensive.  Patient is currently taking ASA 325mg  qd.  She is not sure she wants to try warfarin because she is concerned with dietary restrictions. She eats kale and salads daily  Bleeding signs/symptoms: None Thromboembolic signs/symptoms: None   Current Outpatient Prescriptions:  .  ALPRAZolam (XANAX) 0.25 MG tablet, TAKE 1 TABLET 2 TIMES DAILY AS NEEDED FOR ANXIETY, Disp: 60 tablet, Rfl: 2 .  flecainide (TAMBOCOR) 100 MG tablet, Take 100 mg by mouth 2 (two) times daily. , Disp: , Rfl:  .  furosemide (LASIX) 20 MG tablet, Take 20 mg by mouth as needed. , Disp: , Rfl:  .  metoprolol succinate (TOPROL-XL) 50 MG 24 hr tablet, Take 50 mg by mouth daily. , Disp: , Rfl:  .  pravastatin (PRAVACHOL) 20 MG tablet, Take 1 tablet (20 mg total) by mouth daily., Disp: 90 tablet, Rfl: 3 .  valsartan (DIOVAN) 160 MG tablet, Take 320 mg by mouth daily. , Disp: , Rfl:  .  warfarin (COUMADIN) 2.5 MG tablet, Take 1 tablet (2.5 mg total) by mouth daily. Or as directed by anticogulation clinic, Disp: 90 tablet, Rfl: 1  Active Ambulatory Problems    Diagnosis Date Noted  . Anxiety 08/20/2014  . Atrial fibrillation ([place]) 09/28/2016  . Essential hypertension with goal blood pressure less than 140/90 11/11/2015  . Osteoporosis 06/18/2015  . Hyperlipidemia 09/28/2016   Resolved Ambulatory Problems    Diagnosis Date Noted  . No Resolved Ambulatory Problems   Past Medical History:  Diagnosis Date  . Anxiety   . Arthritis   . Hypertension   . Irregular heart beat      Objective:      Today's Vitals   10/12/16 1507  BP: (!) 148/88  Pulse: 60     Assessment:   Atrial Fibrillation with CHADSVASc score of 3 - indicating need for anitcoagulation for stroke risk reduction  Plan:    1. Patient to start warfarin 2.5mg  qd.  She wants to get form mail order and plan is to start July 1st.   2. Next INR: July 5th, 2018 when she is already scheduled to see Dr Evette Doffing.  3.  Long discussion about risks and benefits of anticoagulation therapy.    4. Patient is to keep vitamin K rich food intake consistent.     Patient ID: Mary [name], female   DOB: 04-03-1946, 71 y.o.   MRN: [id number]

## 2016-10-28 ENCOUNTER — Ambulatory Visit: Payer: Medicare Other | Admitting: Pediatrics

## 2016-11-04 ENCOUNTER — Ambulatory Visit (INDEPENDENT_AMBULATORY_CARE_PROVIDER_SITE_OTHER): Payer: Medicare Other | Admitting: Pediatrics

## 2016-11-04 ENCOUNTER — Encounter: Payer: Self-pay | Admitting: Pediatrics

## 2016-11-04 VITALS — BP 174/74 | HR 53 | Temp 97.1°F | Ht 63.0 in | Wt 113.2 lb

## 2016-11-04 DIAGNOSIS — F419 Anxiety disorder, unspecified: Secondary | ICD-10-CM | POA: Diagnosis not present

## 2016-11-04 DIAGNOSIS — I10 Essential (primary) hypertension: Secondary | ICD-10-CM | POA: Diagnosis not present

## 2016-11-04 DIAGNOSIS — I480 Paroxysmal atrial fibrillation: Secondary | ICD-10-CM | POA: Diagnosis not present

## 2016-11-04 LAB — COAGUCHEK XS/INR WAIVED
INR: 1 (ref 0.9–1.1)
Prothrombin Time: 12.1 s

## 2016-11-04 MED ORDER — ALPRAZOLAM 0.25 MG PO TABS: ORAL_TABLET | ORAL | 1 refills | Status: DC

## 2016-11-04 MED ORDER — DULOXETINE HCL 20 MG PO CPEP: 20.0000 mg | ORAL_CAPSULE | Freq: Every day | ORAL | 1 refills | Status: DC

## 2016-11-04 MED ORDER — AMLODIPINE BESYLATE 2.5 MG PO TABS: 2.5000 mg | ORAL_TABLET | Freq: Every day | ORAL | 3 refills | Status: DC

## 2016-11-04 NOTE — Progress Notes (Signed)
  Subjective:   Patient ID: Mary [name], female    DOB: Mar 08, 1946, 71 y.o.   MRN: [id number] CC: Follow-up (4 week) med problems HPI: Genna Casimir is a 71 y.o. female presenting for Follow-up (4 week)  Anxiety: ongoing symptoms, husband with MDS Drinks half caffeinated half decaf Taking xanax BID Mood is fine Had been tried on zoloft in the past, made her feel bad  Afib: does not want to be on anticoagulation, was prescribed warfarin, has decided not to take it  BP remains high at home No CP, no SOB  Relevant past medical, surgical, family and social history reviewed. Allergies and medications reviewed and updated. History  Smoking Status  . Never Smoker  Smokeless Tobacco  . Never Used   ROS: Per HPI   Objective:    BP (!) 174/74   Pulse (!) 53   Temp (!) 97.1 F (36.2 C) (Oral)   Ht 5\' 3"  (1.6 m)   Wt 113 lb 3.2 oz (51.3 kg)   BMI 20.05 kg/m   Wt Readings from Last 3 Encounters:  11/04/16 113 lb 3.2 oz (51.3 kg)  09/28/16 113 lb 9.6 oz (51.5 kg)    Gen: NAD, alert, cooperative with exam, NCAT EYES: EOMI, no conjunctival injection, or no icterus ENT:  OP without erythema LYMPH: no cervical LAD CV: NRRR, normal S1/S2, no murmur, distal pulses 2+ b/l Resp: CTABL, no wheezes, normal WOB Ext: No edema, warm Neuro: Alert and oriented, strength equal b/l UE and LE, coordination grossly normal MSK: normal muscle bulk Psych: normal affect, tearful at times, mood is fine  Assessment & Plan:  Briley was seen today for follow-up multiple med problems  Diagnoses and all orders for this visit:  Paroxysmal atrial fibrillation (HCC) Declines anticoagulation, insurance no longer pays for DOAC Pt does not want to be on warfarin due to INR checks  Anxiety Ongoing symptoms Cont xanax twice a day, Rx with refills given Start duloxetine -     ALPRAZolam (XANAX) 0.25 MG tablet; TAKE 1 TABLET 2 TIMES DAILY AS NEEDED FOR ANXIETY -     DULoxetine (CYMBALTA) 20 MG  capsule; Take 1 capsule (20 mg total) by mouth daily. -     TSH  Essential hypertension Elevated today Start amlodipine Check at home Let me know if stays elevated -     amLODipine (NORVASC) 2.5 MG tablet; Take 1 tablet (2.5 mg total) by mouth daily. -     Basic Metabolic Panel   Follow up plan: Return in about 10 weeks (around 01/13/2017). [name], MD [place]

## 2016-11-05 LAB — BASIC METABOLIC PANEL WITH GFR
BUN/Creatinine Ratio: 13 (ref 12–28)
BUN: 8 mg/dL (ref 8–27)
CO2: 23 mmol/L (ref 20–29)
Calcium: 9.2 mg/dL (ref 8.7–10.3)
Chloride: 94 mmol/L — ABNORMAL LOW (ref 96–106)
Creatinine, Ser: 0.62 mg/dL (ref 0.57–1.00)
GFR calc Af Amer: 105 mL/min/1.73 (ref >59)
GFR calc non Af Amer: 91 mL/min/1.73 (ref >59)
Glucose: 87 mg/dL (ref 65–99)
Potassium: 4.1 mmol/L (ref 3.5–5.2)
Sodium: 135 mmol/L (ref 134–144)

## 2016-11-05 LAB — TSH: TSH: 1.92 u[IU]/mL (ref 0.450–4.500)

## 2016-11-22 ENCOUNTER — Telehealth: Payer: Self-pay | Admitting: Pediatrics

## 2016-11-22 NOTE — Telephone Encounter (Signed)
Please review and advise.

## 2016-11-23 NOTE — Telephone Encounter (Signed)
Called pt back, she is going to check pricing for several possible medications with her insurance co and call us back.

## 2016-11-24 ENCOUNTER — Telehealth: Payer: Self-pay | Admitting: Pediatrics

## 2016-11-24 MED ORDER — IRBESARTAN 300 MG PO TABS: 300.0000 mg | ORAL_TABLET | Freq: Every day | ORAL | 1 refills | Status: DC

## 2016-11-24 NOTE — Telephone Encounter (Signed)
Please advise 

## 2016-11-24 NOTE — Telephone Encounter (Signed)
Patient aware.

## 2017-01-13 ENCOUNTER — Ambulatory Visit (INDEPENDENT_AMBULATORY_CARE_PROVIDER_SITE_OTHER): Payer: Medicare Other | Admitting: Pediatrics

## 2017-01-13 ENCOUNTER — Encounter: Payer: Self-pay | Admitting: Pediatrics

## 2017-01-13 VITALS — BP 160/78 | HR 55 | Temp 97.4°F | Ht 63.0 in | Wt 111.4 lb

## 2017-01-13 DIAGNOSIS — I10 Essential (primary) hypertension: Secondary | ICD-10-CM

## 2017-01-13 DIAGNOSIS — E785 Hyperlipidemia, unspecified: Secondary | ICD-10-CM

## 2017-01-13 DIAGNOSIS — L570 Actinic keratosis: Secondary | ICD-10-CM

## 2017-01-13 DIAGNOSIS — F419 Anxiety disorder, unspecified: Secondary | ICD-10-CM

## 2017-01-13 DIAGNOSIS — I480 Paroxysmal atrial fibrillation: Secondary | ICD-10-CM

## 2017-01-13 MED ORDER — ALPRAZOLAM 0.25 MG PO TABS: ORAL_TABLET | ORAL | 1 refills | Status: DC

## 2017-01-13 NOTE — Progress Notes (Signed)
  Subjective:   Patient ID: Mary [name], female    DOB: Nov 22, 1945, 71 y.o.   MRN: [id number] CC: Follow-up med problems  HPI: Mary [name] is a 71 y.o. female presenting for Follow-up  HTN: BPs at home 130s/70s Feels much better with improved control Taking meds regularly  Anxiety: not taking cymbalta Was too expensive Taking xanax 0.25 half or whole tab twice a day Usually half tab in the morning Is very hesitant about starting alternate anxiety medicines, worried about weight gain, side effects  Appetite has been good Eats mostly fruits, vegetables, does get protein in regularly  Afib: declines anticoagulation at this time Not taking aspirin but says she can restart at home  Used to get regular skin checks with dermatology, has had superficial skin cancers removed she says Wants to get back in to see dermatology  Relevant past medical, surgical, family and social history reviewed. Allergies and medications reviewed and updated. History  Smoking Status  . Never Smoker  Smokeless Tobacco  . Never Used   ROS: Per HPI   Objective:    BP (!) 160/78   Pulse (!) 55   Temp (!) 97.4 F (36.3 C) (Oral)   Ht 5\' 3"  (1.6 m)   Wt 111 lb 6.4 oz (50.5 kg)   BMI 19.73 kg/m   Wt Readings from Last 3 Encounters:  01/13/17 111 lb 6.4 oz (50.5 kg)  11/04/16 113 lb 3.2 oz (51.3 kg)  09/28/16 113 lb 9.6 oz (51.5 kg)    Gen: NAD, alert, thin, well appearing, cooperative with exam, NCAT EYES: EOMI, no conjunctival injection, or no icterus ENT:  OP without erythema LYMPH: no cervical LAD CV: NRRR, normal S1/S2, no murmur Resp: CTABL, no wheezes, normal WOB Abd: +BS, soft, NTND. no guarding or organomegaly Ext: No edema, warm Neuro: Alert and oriented, strength equal b/l UE and LE, coordination grossly normal MSK: normal muscle bulk  Assessment & Plan:  Mary [name] was seen today for follow-up med problems  Diagnoses and all orders for this visit:  Anxiety Stable on  below Very hesitant about starting SSRI, says may be open in future Cont to address as needed -     ALPRAZolam (XANAX) 0.25 MG tablet; TAKE 1 TABLET 2 TIMES DAILY AS NEEDED FOR ANXIETY  AK (actinic keratosis) -     Ambulatory referral to Dermatology  Paroxysmal atrial fibrillation ([place]) Declines anticoagulation, pt aware of stroke risk Will restart ASA 325mg  daily  Essential hypertension with goal blood pressure less than 140/90 Improving control at home Feels better with lower bps Cont to follow at home Cont meds  Hyperlipidemia, unspecified hyperlipidemia type Stable, cont pravastatin  Follow up plan: Return in about 3 months (around 04/14/2017). Mary Found, MD [place]

## 2017-01-14 ENCOUNTER — Encounter: Payer: Self-pay | Admitting: *Deleted

## 2017-02-19 ENCOUNTER — Other Ambulatory Visit: Payer: Self-pay | Admitting: Pediatrics

## 2017-04-14 ENCOUNTER — Encounter: Payer: Self-pay | Admitting: Pediatrics

## 2017-04-14 ENCOUNTER — Ambulatory Visit (INDEPENDENT_AMBULATORY_CARE_PROVIDER_SITE_OTHER): Payer: Medicare Other | Admitting: Pediatrics

## 2017-04-14 VITALS — BP 132/78 | HR 56 | Temp 97.3°F | Ht 63.0 in | Wt 111.8 lb

## 2017-04-14 DIAGNOSIS — I10 Essential (primary) hypertension: Secondary | ICD-10-CM | POA: Diagnosis not present

## 2017-04-14 DIAGNOSIS — F419 Anxiety disorder, unspecified: Secondary | ICD-10-CM

## 2017-04-14 DIAGNOSIS — M810 Age-related osteoporosis without current pathological fracture: Secondary | ICD-10-CM | POA: Diagnosis not present

## 2017-04-14 DIAGNOSIS — I4891 Unspecified atrial fibrillation: Secondary | ICD-10-CM

## 2017-04-14 MED ORDER — IRBESARTAN 300 MG PO TABS: 300.0000 mg | ORAL_TABLET | Freq: Every day | ORAL | 1 refills | Status: DC

## 2017-04-14 MED ORDER — ALPRAZOLAM 0.25 MG PO TABS: ORAL_TABLET | ORAL | 5 refills | Status: DC

## 2017-04-14 MED ORDER — ALPRAZOLAM 0.25 MG PO TABS: ORAL_TABLET | ORAL | 4 refills | Status: DC

## 2017-04-14 NOTE — Progress Notes (Signed)
  Subjective:   Patient ID: Mary [name], female    DOB: [birth year], 71 y.o.   MRN: [id number] CC: Follow-up (3 month) multiple med problems HPI: Mary [name] is a 71 y.o. female presenting for Follow-up (3 month)  Afib: pt denies any palpitations, heart racing Not on anticoagulation due to pt preference Sometimes taking aspirin Following with cardiology  Osteoporosis: was started on bisphosphonate in the past, stopped taking it after several months bc of constipation  Hypertension: no HA, no CP, she does not check her BP regularly but says she can tell when her BP is up She thinks improved with amlodipine No lightheadedness  Anxiety: taking xanax once sometimes twice a day to help with stress and anxiety Mary [name], was put on hospice for 10 days last month after a fall, he improved, is with her today in clinic though still very ill Ongoing stress at home with above  HLD: on statin, no s/e  Staying active  Relevant past medical, surgical, family and social history reviewed. Allergies and medications reviewed and updated. Social History   Tobacco Use  Smoking Status Never Smoker  Smokeless Tobacco Never Used   ROS: Per HPI   Objective:    BP 132/78   Pulse (!) 56   Temp (!) 97.3 F (36.3 C) (Oral)   Ht 5\' 3"  (1.6 m)   Wt 111 lb 12.8 oz (50.7 kg)   BMI 19.80 kg/m   Wt Readings from Last 3 Encounters:  04/14/17 111 lb 12.8 oz (50.7 kg)  01/13/17 111 lb 6.4 oz (50.5 kg)  11/04/16 113 lb 3.2 oz (51.3 kg)    Gen: NAD, alert, thin, well-dressed, cooperative with exam, NCAT EYES: EOMI, no conjunctival injection, or no icterus ENT:   OP without erythema LYMPH: no cervical LAD CV: NRRR, normal S1/S2, no murmur, distal pulses 2+ b/l Resp: CTABL, no wheezes, normal WOB Abd: +BS, soft, NTND.  Ext: No edema, warm Neuro: Alert and oriented, strength equal b/l UE and LE, coordination grossly normal Psych: full affect, mood is fine  Assessment & Plan:    [place] was seen today for follow-up med problems  Diagnoses and all orders for this visit:  Essential hypertension Improved control, cont amlodipine, below Check at home when able, let me know if regularly elevated -     irbesartan (AVAPRO) 300 MG tablet; Take 1 tablet (300 mg total) by mouth daily.  Anxiety Discussed options, continues to decline SSRI Stable on below, take 1-2 times a day as needed, minimize use as able Return precautions discussed -     ALPRAZolam (XANAX) 0.25 MG tablet; TAKE 1 TABLET 2 TIMES DAILY AS NEEDED FOR ANXIETY  Osteoporosis without current pathological fracture, unspecified osteoporosis type Pt with constipation on bisphosphonate, open to prolia. Will check with insurance.  Afib Not on anticoag, continues to decline Stroke risk discussed Pt will restart aspirin daily  Follow up plan: Return in about 6 months (around 10/13/2017). Mary Found, MD [place]

## 2017-04-16 ENCOUNTER — Encounter: Payer: Self-pay | Admitting: Pediatrics

## 2017-04-21 ENCOUNTER — Ambulatory Visit: Payer: Medicare Other | Admitting: Pediatrics

## 2017-05-09 ENCOUNTER — Other Ambulatory Visit: Payer: Medicare Other

## 2017-05-09 ENCOUNTER — Telehealth: Payer: Self-pay | Admitting: Pediatrics

## 2017-05-10 NOTE — Telephone Encounter (Signed)
[facility] to x-ray 

## 2017-05-17 NOTE — Telephone Encounter (Signed)
Pt rc for dexa appt

## 2017-05-17 NOTE — Telephone Encounter (Signed)
[facility] to x-ray 

## 2017-05-17 NOTE — Telephone Encounter (Signed)
Please call pt

## 2017-05-19 ENCOUNTER — Other Ambulatory Visit (INDEPENDENT_AMBULATORY_CARE_PROVIDER_SITE_OTHER): Payer: Medicare Other

## 2017-05-19 ENCOUNTER — Other Ambulatory Visit: Payer: Self-pay | Admitting: Pediatrics

## 2017-05-19 DIAGNOSIS — Z780 Asymptomatic menopausal state: Secondary | ICD-10-CM

## 2017-06-11 ENCOUNTER — Other Ambulatory Visit: Payer: Self-pay | Admitting: Pediatrics

## 2017-06-11 DIAGNOSIS — E785 Hyperlipidemia, unspecified: Secondary | ICD-10-CM

## 2017-06-11 DIAGNOSIS — I10 Essential (primary) hypertension: Secondary | ICD-10-CM

## 2017-06-15 ENCOUNTER — Other Ambulatory Visit: Payer: Self-pay | Admitting: Pediatrics

## 2017-06-15 DIAGNOSIS — E785 Hyperlipidemia, unspecified: Secondary | ICD-10-CM

## 2017-06-15 DIAGNOSIS — I10 Essential (primary) hypertension: Secondary | ICD-10-CM

## 2017-06-15 DIAGNOSIS — M810 Age-related osteoporosis without current pathological fracture: Secondary | ICD-10-CM

## 2017-06-15 MED ORDER — PRAVASTATIN SODIUM 20 MG PO TABS: 20.0000 mg | ORAL_TABLET | Freq: Every day | ORAL | 1 refills | Status: DC

## 2017-06-15 MED ORDER — AMLODIPINE BESYLATE 2.5 MG PO TABS: 2.5000 mg | ORAL_TABLET | Freq: Every day | ORAL | 1 refills | Status: DC

## 2017-06-15 MED ORDER — IRBESARTAN 300 MG PO TABS: 300.0000 mg | ORAL_TABLET | Freq: Every day | ORAL | 1 refills | Status: DC

## 2017-06-15 MED ORDER — ALENDRONATE SODIUM 70 MG PO TABS: 70.0000 mg | ORAL_TABLET | ORAL | 3 refills | Status: DC

## 2017-07-01 ENCOUNTER — Telehealth: Payer: Self-pay | Admitting: Pediatrics

## 2017-07-01 NOTE — Telephone Encounter (Signed)
Thanks, d/c'ed from med list, added to allergy list

## 2017-07-01 NOTE — Telephone Encounter (Signed)
Patient states after taking fosamax she became extremely dizzy and it caused her BP to become elevated. Patient states that she has stopped taking and just wanted to let us know that she has stopped. She has not had anymore episodes since stopping.

## 2017-09-26 ENCOUNTER — Ambulatory Visit (INDEPENDENT_AMBULATORY_CARE_PROVIDER_SITE_OTHER): Payer: Medicare Other | Admitting: Pediatrics

## 2017-09-26 ENCOUNTER — Encounter: Payer: Self-pay | Admitting: Pediatrics

## 2017-09-26 VITALS — BP 133/82 | HR 66 | Temp 97.3°F | Ht 63.0 in | Wt 105.6 lb

## 2017-09-26 DIAGNOSIS — F419 Anxiety disorder, unspecified: Secondary | ICD-10-CM | POA: Diagnosis not present

## 2017-09-26 DIAGNOSIS — M810 Age-related osteoporosis without current pathological fracture: Secondary | ICD-10-CM

## 2017-09-26 DIAGNOSIS — K146 Glossodynia: Secondary | ICD-10-CM

## 2017-09-26 MED ORDER — ESCITALOPRAM OXALATE 5 MG PO TABS: 5.0000 mg | ORAL_TABLET | Freq: Every day | ORAL | 1 refills | Status: DC

## 2017-09-26 NOTE — Progress Notes (Signed)
Subjective:   Patient ID: Mary [name], female    DOB: [birth year], 72 y.o.   MRN: [id number] CC: Extremity Weakness; Fatigue; and No appetite  HPI: Mary [name] is a 72 y.o. female   Anxiety: More symptoms over the last few weeks.  Husband died in 20-May-2022.  Now she is living alone.  She does have family in the area.  Feeling more anxious.  Worries about something happening to her when she is home alone.  Taking Xanax half to 1 tab 1-2 times a day.  Yesterday while sitting at home had a feeling of heaviness and fatigue all of her body.  No chest pain or pressure, no shortness of breath, no nausea with it.  No heart racing or palpitations.  No numbness or tingling.  Could still do her usual activities.  She thinks is related to stress.  Has been much more active recently, trying to get ready to move, going through husband's things.  She has checked her blood pressure and heart rate at home, systolics 263Z, heart rate 60s regularly.  In the past her systolic blood pressures have been up into the 160s 180s when she checks.  Osteoporosis: Prolia had been too expensive when checked in the past.   Burning mouth: For last 2 to 3 years has had burning inside her mouth.  She is been using some topical medicine from dermatology on her lips.  It has been helping some.  Sometimes she will get sores.  Appetite has been down, she thinks also due to stress.  Relevant past medical, surgical, family and social history reviewed. Allergies and medications reviewed and updated. Social History   Tobacco Use  Smoking Status Never Smoker  Smokeless Tobacco Never Used   ROS: Per HPI   Objective:    BP 133/82   Pulse 66   Temp (!) 97.3 F (36.3 C) (Oral)   Ht _0  (1.6 m)   Wt 105 lb 9.6 oz (47.9 kg)   BMI 18.71 kg/m   Wt Readings from Last 3 Encounters:  09/26/17 105 lb 9.6 oz (47.9 kg)  04/14/17 111 lb 12.8 oz (50.7 kg)  01/13/17 111 lb 6.4 oz (50.5 kg)    Gen: NAD, alert, cooperative with  exam, NCAT  EYES: EOMI, no conjunctival injection, or no icterus ENT:  TMs pearly gray b/l, OP without erythema LYMPH: no cervical LAD CV: NRRR, normal S1/S2, no murmur, distal pulses 2+ b/l Resp: CTABL, no wheezes, normal WOB Abd: +BS, soft, NTND. no guarding or organomegaly Ext: No edema, warm Neuro: Alert and oriented, strength equal b/l UE and LE, coordination grossly normal MSK: normal muscle bulk  Assessment & Plan:  Mary [name] was seen today for extremity weakness, fatigue and no appetite.  Diagnoses and all orders for this visit:  Anxiety Start half tab of Lexapro for 2 weeks then take full tab.  Stress relieving strategies.  She is not sure about grief counseling, is aware she can get it through hospice.  She is going to have her children call/text her more regularly which she thinks will help some with the loneliness   And that they will be very willing to do. -     escitalopram (LEXAPRO) 5 MG tablet; Take 1 tablet (5 mg total) by mouth daily.   Osteoporosis, unspecified osteoporosis type, unspecified pathological fracture presence Patient agreeable to trying Prolia again. -     VITAMIN D 25 Hydroxy (Vit-D Deficiency, Fractures)  Burning mouth syndrome -     CBC  with Differential/Platelet -     TSH -     CMP14+EGFR   Follow up plan: Return in about 3 weeks (around 10/17/2017).  Return precautions discussed.  [name], MD [place]

## 2017-09-27 LAB — CBC WITH DIFFERENTIAL/PLATELET
BASOS: 1 %
Basophils Absolute: 0.1 10*3/uL (ref 0.0–0.2)
EOS (ABSOLUTE): 0.1 10*3/uL (ref 0.0–0.4)
Eos: 1 %
Hematocrit: 36.1 % (ref 34.0–46.6)
Hemoglobin: 12.5 g/dL (ref 11.1–15.9)
IMMATURE GRANS (ABS): 0 10*3/uL (ref 0.0–0.1)
Immature Granulocytes: 0 %
LYMPHS ABS: 1.5 10*3/uL (ref 0.7–3.1)
LYMPHS: 19 %
MCH: 30.2 pg (ref 26.6–33.0)
MCHC: 34.6 g/dL (ref 31.5–35.7)
MCV: 87 fL (ref 79–97)
MONOS ABS: 0.7 10*3/uL (ref 0.1–0.9)
Monocytes: 9 %
NEUTROS ABS: 5.7 10*3/uL (ref 1.4–7.0)
Neutrophils: 70 %
PLATELETS: 304 10*3/uL (ref 150–450)
RBC: 4.14 x10E6/uL (ref 3.77–5.28)
RDW: 13 % (ref 12.3–15.4)
WBC: 8 10*3/uL (ref 3.4–10.8)

## 2017-09-27 LAB — CMP14+EGFR
A/G RATIO: 2.5 — AB (ref 1.2–2.2)
ALT: 12 IU/L (ref 0–32)
AST: 22 IU/L (ref 0–40)
Albumin: 5 g/dL — ABNORMAL HIGH (ref 3.5–4.8)
Alkaline Phosphatase: 71 IU/L (ref 39–117)
BILIRUBIN TOTAL: 0.4 mg/dL (ref 0.0–1.2)
BUN/Creatinine Ratio: 10 — ABNORMAL LOW (ref 12–28)
BUN: 6 mg/dL — ABNORMAL LOW (ref 8–27)
CHLORIDE: 93 mmol/L — AB (ref 96–106)
CO2: 26 mmol/L (ref 20–29)
Calcium: 9.3 mg/dL (ref 8.7–10.3)
Creatinine, Ser: 0.6 mg/dL (ref 0.57–1.00)
GFR calc non Af Amer: 91 mL/min/{1.73_m2} (ref >59)
GFR, EST AFRICAN AMERICAN: 105 mL/min/{1.73_m2} (ref >59)
GLOBULIN, TOTAL: 2 g/dL (ref 1.5–4.5)
Glucose: 94 mg/dL (ref 65–99)
POTASSIUM: 4.3 mmol/L (ref 3.5–5.2)
SODIUM: 134 mmol/L (ref 134–144)
TOTAL PROTEIN: 7 g/dL (ref 6.0–8.5)

## 2017-09-27 LAB — TSH: TSH: 1.66 u[IU]/mL (ref 0.450–4.500)

## 2017-09-27 LAB — VITAMIN D 25 HYDROXY (VIT D DEFICIENCY, FRACTURES): Vit D, 25-Hydroxy: 25.6 ng/mL — ABNORMAL LOW (ref 30.0–100.0)

## 2017-10-13 ENCOUNTER — Ambulatory Visit (INDEPENDENT_AMBULATORY_CARE_PROVIDER_SITE_OTHER): Payer: Medicare Other | Admitting: Pediatrics

## 2017-10-13 ENCOUNTER — Encounter: Payer: Self-pay | Admitting: Pediatrics

## 2017-10-13 VITALS — BP 135/75 | HR 58 | Temp 98.2°F | Ht 63.0 in | Wt 109.4 lb

## 2017-10-13 DIAGNOSIS — F419 Anxiety disorder, unspecified: Secondary | ICD-10-CM

## 2017-10-13 DIAGNOSIS — I480 Paroxysmal atrial fibrillation: Secondary | ICD-10-CM

## 2017-10-13 DIAGNOSIS — I10 Essential (primary) hypertension: Secondary | ICD-10-CM

## 2017-10-13 NOTE — Progress Notes (Signed)
  Subjective:   Patient ID: Mary [name], female    DOB: 1945-07-14, 72 y.o.   MRN: [id number] CC: Medical Management of Chronic Issues  HPI: Mary [name] is a 72 y.o. female   Anxiety: thinks lexapro is helping. Taking 2.5mg  once a day in the morning. Feels more even, in control of anxiety.   HTN: taking amlodipine regularly  Afib: not on anticoagulant due to pt choice, has been counseled re yearly risk of stroke.  No heart palpitations, racing heartbeat.  Relevant past medical, surgical, family and social history reviewed. Allergies and medications reviewed and updated. Social History   Tobacco Use  Smoking Status Never Smoker  Smokeless Tobacco Never Used   ROS: Per HPI   Objective:    BP 135/75   Pulse (!) 58   Temp 98.2 F (36.8 C) (Oral)   Ht 5\' 3"  (1.6 m)   Wt 109 lb 6.4 oz (49.6 kg)   BMI 19.38 kg/m   Wt Readings from Last 3 Encounters:  10/13/17 109 lb 6.4 oz (49.6 kg)  09/26/17 105 lb 9.6 oz (47.9 kg)  04/14/17 111 lb 12.8 oz (50.7 kg)    Gen: NAD, alert, cooperative with exam, NCAT EYES: EOMI, no conjunctival injection, or no icterus ENT:  TMs pearly gray b/l, OP without erythema LYMPH: no cervical LAD CV: NRRR, normal S1/S2 Resp: CTABL, no wheezes, normal WOB Abd: +BS, soft, NTND.  Ext: No edema, warm Neuro: Alert and oriented MSK: normal muscle bulk Psych: nl affect, no thoughts of self-harm.  Assessment & Plan:  Mary [name] was seen today for medical management of chronic issues.  Diagnoses and all orders for this visit:  Anxiety  Improved on Lexapro, some ongoing symptoms, increase to 5 mg.  Essential hypertension with goal blood pressure less than 140/90 Stable, continue current medicines  Follow up plan: Return in about 3 months (around 01/13/2018). [name], MD [place]

## 2017-11-04 ENCOUNTER — Telehealth: Payer: Self-pay | Admitting: *Deleted

## 2017-11-04 NOTE — Telephone Encounter (Signed)
Prolia has been too expensive in the past but her financial situation has changed. Would like to see if she qualifies for grant assistance. Spoke with patient and advised that I will mail out information about the [employer]. If she qualifies for a grant we can proceed with prolia. She will follow up with this information.

## 2018-01-16 ENCOUNTER — Other Ambulatory Visit: Payer: Self-pay | Admitting: Pediatrics

## 2018-01-16 ENCOUNTER — Ambulatory Visit (INDEPENDENT_AMBULATORY_CARE_PROVIDER_SITE_OTHER): Payer: Medicare Other | Admitting: Pediatrics

## 2018-01-16 ENCOUNTER — Encounter: Payer: Self-pay | Admitting: Pediatrics

## 2018-01-16 VITALS — BP 139/68 | HR 58 | Temp 97.8°F | Ht 63.0 in | Wt 111.0 lb

## 2018-01-16 DIAGNOSIS — M810 Age-related osteoporosis without current pathological fracture: Secondary | ICD-10-CM

## 2018-01-16 DIAGNOSIS — E785 Hyperlipidemia, unspecified: Secondary | ICD-10-CM

## 2018-01-16 DIAGNOSIS — I10 Essential (primary) hypertension: Secondary | ICD-10-CM | POA: Diagnosis not present

## 2018-01-16 DIAGNOSIS — R42 Dizziness and giddiness: Secondary | ICD-10-CM

## 2018-01-16 NOTE — Patient Instructions (Addendum)
Cyril Mourning helps [place] with our Prolia applications  How to Perform the Epley Maneuver The Epley maneuver is an exercise that relieves symptoms of vertigo. Vertigo is the feeling that you or your surroundings are moving when they are not. When you feel vertigo, you may feel like the room is spinning and have trouble walking. Dizziness is a little different than vertigo. When you are dizzy, you may feel unsteady or light-headed. You can do this maneuver at home whenever you have symptoms of vertigo. You can do it up to 3 times a day until your symptoms go away. Even though the Epley maneuver may relieve your vertigo for a few weeks, it is possible that your symptoms will return. This maneuver relieves vertigo, but it does not relieve dizziness. What are the risks? If it is done correctly, the Epley maneuver is considered safe. Sometimes it can lead to dizziness or nausea that goes away after a short time. If you develop other symptoms, such as changes in vision, weakness, or numbness, stop doing the maneuver and call your health care provider. How to perform the Epley maneuver 1. Sit on the edge of a bed or table with your back straight and your legs extended or hanging over the edge of the bed or table. 2. Turn your head halfway toward the affected ear or side. 3. Lie backward quickly with your head turned until you are lying flat on your back. You may want to position a pillow under your shoulders. 4. Hold this position for 30 seconds. You may experience an attack of vertigo. This is normal. 5. Turn your head to the opposite direction until your unaffected ear is facing the floor. 6. Hold this position for 30 seconds. You may experience an attack of vertigo. This is normal. Hold this position until the vertigo stops. 7. Turn your whole body to the same side as your head. Hold for another 30 seconds. 8. Sit back up. You can repeat this exercise up to 3 times a day. Follow these instructions at  home:  After doing the Epley maneuver, you can return to your normal activities.  Ask your health care provider if there is anything you should do at home to prevent vertigo. He or she may recommend that you: ? Keep your head raised (elevated) with two or more pillows while you sleep. ? Do not sleep on the side of your affected ear. ? Get up slowly from bed. ? Avoid sudden movements during the day. ? Avoid extreme head movement, like looking up or bending over. Contact a health care provider if:  Your vertigo gets worse.  You have other symptoms, including: ? Nausea. ? Vomiting. ? Headache. Get help right away if:  You have vision changes.  You have a severe or worsening headache or neck pain.  You cannot stop vomiting.  You have new numbness or weakness in any part of your body. Summary  Vertigo is the feeling that you or your surroundings are moving when they are not.  The Epley maneuver is an exercise that relieves symptoms of vertigo.  If the Epley maneuver is done correctly, it is considered safe. You can do it up to 3 times a day. This information is not intended to replace advice given to you by your health care provider. Make sure you discuss any questions you have with your health care provider. Document Released: 04/17/2013 Document Revised: 03/02/2016 Document Reviewed: 03/02/2016 Elsevier Interactive Patient Education  2017 [employer].

## 2018-01-16 NOTE — Progress Notes (Signed)
  Subjective:   Patient ID: Mary [name], female    DOB: [birth year], 72 y.o.   MRN: [id number] CC: Medical Management of Chronic Issues and Dizziness  HPI: Mary [name] is a 72 y.o. female   Has had a few episodes of dizziness in the morning. Improves after a couple of hours of moving slowly to get ready for day. Feels like the room is spinning. Moving fast can make it worse. Later in the day moving head, standing doesn't make it worse.  Osteoporosis: open to prolia, has information to contact company re grant to help with cost  HTN: takes BP meds regularly, BP usually around what it is today  Relevant past medical, surgical, family and social history reviewed. Allergies and medications reviewed and updated. Social History   Tobacco Use  Smoking Status Never Smoker  Smokeless Tobacco Never Used   ROS: Per HPI   Objective:    BP 139/68   Pulse (!) 58   Temp 97.8 F (36.6 C) (Oral)   Ht 5\' 3"  (1.6 m)   Wt 111 lb (50.3 kg)   BMI 19.66 kg/m   Wt Readings from Last 3 Encounters:  01/16/18 111 lb (50.3 kg)  10/13/17 109 lb 6.4 oz (49.6 kg)  09/26/17 105 lb 9.6 oz (47.9 kg)    Gen: NAD, alert, cooperative with exam, NCAT EYES: EOMI, 1-2 beats nystagmus b/l, no conjunctival injection, or no icterus ENT:  TMs dull gray b/l, OP without erythema LYMPH: no cervical LAD CV: NRRR, normal S1/S2, no murmur, distal pulses 2+ b/l Resp: CTABL, no wheezes, normal WOB Abd: +BS, soft, NTND.  Ext: No edema, warm Neuro: Alert and oriented MSK: normal muscle bulk  Assessment & Plan:  Mary [name] was seen today for medical management of chronic issues and dizziness.  Diagnoses and all orders for this visit:  Osteoporosis, unspecified osteoporosis type, unspecified pathological fracture presence Discussed options, bc of intolerance to alendronate will try prolia. Pt to fill out paperwork for prolia  Vertigo Improved now. Discussed epley maneuver, return precautions  Essential  hypertension Stable, check BPs at home, let me know if elevated or low  Follow up plan: Return in about 3 months (around 04/17/2018). Mary Found, MD [place]

## 2018-01-20 ENCOUNTER — Ambulatory Visit (INDEPENDENT_AMBULATORY_CARE_PROVIDER_SITE_OTHER): Payer: Medicare Other | Admitting: *Deleted

## 2018-01-20 ENCOUNTER — Encounter: Payer: Self-pay | Admitting: *Deleted

## 2018-01-20 VITALS — BP 141/70 | HR 61 | Ht 63.0 in | Wt 110.0 lb

## 2018-01-20 DIAGNOSIS — Z1212 Encounter for screening for malignant neoplasm of rectum: Secondary | ICD-10-CM

## 2018-01-20 DIAGNOSIS — Z1211 Encounter for screening for malignant neoplasm of colon: Secondary | ICD-10-CM

## 2018-01-20 DIAGNOSIS — Z0000 Encounter for general adult medical examination without abnormal findings: Secondary | ICD-10-CM

## 2018-01-20 NOTE — Patient Instructions (Signed)
  Mary [name] , Thank you for taking time to come for your Medicare Wellness Visit. I appreciate your ongoing commitment to your health goals. Please review the following plan we discussed and let me know if I can assist you in the future.   These are the goals we discussed: Goals    . Exercise 150 min/wk Moderate Activity       This is a list of the screening recommended for you and due dates:  Health Maintenance  Topic Date Due  .  Hepatitis C: One time screening is recommended by Center for Disease Control  (CDC) for  adults born from [birth year] through 1966.   [birth year]  . Flu Shot  11/24/2017  . Colon Cancer Screening  09/29/2026*  . Mammogram  07/20/2018  . DEXA scan (bone density measurement)  05/20/2019  . Tetanus Vaccine  06/14/2023  . Pneumonia vaccines  Completed  *Topic was postponed. The date shown is not the original due date.

## 2018-01-20 NOTE — Progress Notes (Addendum)
Subjective:   Mary [name] is a 72 y.o. female who presents for a Medicare Annual Wellness Visit. Mary [name] lives at home alone since her husband died. He was exposed to benzine that caused MDS which is similar to leukemia. He was sick for a while before passing. They were married for 53 years and were childhood sweethearts. They built a house in [place] and she is still doing some work to it. She has one son that lives in [place] and one in [place]. She also has a daughter that lives in [place], [place]. She walks on her porch and she is very active around her home. She also has an indoor cat that is great company. Her home is secluded but she feels safe. She goes once a month with ladies from church to eat lunch and to shop or or to a movie.   Review of Systems    Patient reports that her overall health is unchanged compared to last year.  Cardiac Risk Factors include: advanced age (>6men, >72 women);dyslipidemia;hypertension  Psych: taking antidepressants since her husband passed. Sleeps ok but she does dream about him.   All other systems negative       Current Medications (verified) Outpatient Encounter Medications as of 01/20/2018  Medication Sig  . amLODipine (NORVASC) 2.5 MG tablet Take 1 tablet (2.5 mg total) by mouth daily.  [name] escitalopram (LEXAPRO) 5 MG tablet Take 1 tablet (5 mg total) by mouth daily.  . flecainide (TAMBOCOR) 100 MG tablet Take 100 mg by mouth 2 (two) times daily.   . irbesartan (AVAPRO) 300 MG tablet Take 1 tablet (300 mg total) by mouth daily.  . metoprolol succinate (TOPROL-XL) 50 MG 24 hr tablet Take 50 mg by mouth daily.   . pravastatin (PRAVACHOL) 20 MG tablet TAKE 1 TABLET BY MOUTH  DAILY   No facility-administered encounter medications on file as of 01/20/2018.     Allergies (verified) Fosamax [alendronate sodium]; Sertraline; and Sulfa antibiotics   History: Past Medical History:  Diagnosis Date  . Anxiety   . Arthritis   .  Hypertension   . Irregular heart beat    Past Surgical History:  Procedure Laterality Date  . ABDOMINAL HYSTERECTOMY    . KNEE ARTHROSCOPY    . TONSILLECTOMY     Family History  Problem Relation Age of Onset  . Arthritis Mother   . Heart disease Mother   . Hypertension Mother   . Hyperlipidemia Mother   . Kidney disease Mother   . Hypertension Brother   . Stroke Maternal Grandmother   . Heart disease Maternal Grandfather   . Arthritis Paternal Grandmother    Social History   Socioeconomic History  . Marital status: Married    Spouse name: Not on file  . Number of children: 3  . Years of education: 40  . Highest education level: 12th grade  Occupational History  . Not on file  Social Needs  . Financial resource strain: Not hard at all  . Food insecurity:    Worry: Never true    Inability: Never true  . Transportation needs:    Medical: No    Non-medical: No  Tobacco Use  . Smoking status: Never Smoker  . Smokeless tobacco: Never Used  Substance and Sexual Activity  . Alcohol use: Yes    Alcohol/week: 7.0 standard drinks    Types: 7 Glasses of wine per week  . Drug use: No  . Sexual activity: Not  Currently  Lifestyle  . Physical activity:    Days per week: 7 days    Minutes per session: 30 min  . Stress: Only a little  Relationships  . Social connections:    Talks on phone: More than three times a week    Gets together: More than three times a week    Attends religious service: More than 4 times per year    Active member of club or organization: Yes    Attends meetings of clubs or organizations: More than 4 times per year    Relationship status: Widowed  Other Topics Concern  . Not on file  Social History Narrative  . Not on file    Tobacco Use No.  Clinical Intake:  Pre-visit preparation completed: No  Pain : 0-10 Pain Type: Chronic pain Pain Location: Neck Pain Descriptors / Indicators: Aching Pain Onset: Today Pain Frequency:  Intermittent Pain Relieving Factors: rest Effect of Pain on Daily Activities: minimal  Pain Relieving Factors: rest  Nutritional Status: BMI of 19-24  Normal Diabetes: No  How often do you need to have someone help you when you read instructions, pamphlets, or other written materials from your doctor or pharmacy?: 1 - Never What is the last grade level you completed in school?: high school graduate  Interpreter Needed?: No  Information entered by :: Chong Sicilian, RN   Activities of Daily Living In your present state of health, do you have any difficulty performing the following activities: 01/20/2018  Hearing? Y  Comment has some decreased hearing. notices it especially in a crowd  Vision? N  Comment has reading glasses. recent eye exam that was normal  Difficulty concentrating or making decisions? N  Walking or climbing stairs? N  Dressing or bathing? N  Doing errands, shopping? N  Preparing Food and eating ? N  Using the Toilet? N  In the past six months, have you accidently leaked urine? N  Do you have problems with loss of bowel control? N  Managing your Medications? N  Managing your Finances? N  Housekeeping or managing your Housekeeping? N  Some recent data might be hidden     Diet Eats 3 meals a day   Eats raw nuts as a snack Snacks on fruit. No junk food  Exercise Current Exercise Habits: The patient does not participate in regular exercise at present, Exercise limited by: None identified   Depression Screen PHQ 2/9 Scores 01/20/2018 01/20/2018 01/16/2018 10/13/2017 09/26/2017 04/14/2017 01/13/2017  PHQ - 2 Score 0 0 0 0 0 0 0     Fall Risk Fall Risk  01/20/2018 01/20/2018 01/16/2018 10/13/2017 09/26/2017  Falls in the past year? Yes No No No No  Comment was squating and stood up too quickly - - - -  Number falls in past yr: 1 - - - -  Injury with Fall? No - - - -  Risk for fall due to : Other (Comment);Medication side effect - - - -  Follow up Falls prevention  discussed - - - -    Safety Is the patient's home free of loose throw rugs in walkways, pet beds, electrical cords, etc?   yes      Grab bars in the bathroom? yes      Walkin shower? no      Shower Seat? no      Handrails on the stairs?   yes      Adequate lighting?   yes  Patient Care Team: [name]  L, MD as PCP - General (Pediatrics) Ellis Parents, MD as Attending Physician (Internal Medicine)  Hospitalizations, surgeries, and ER visits in previous 12 months No hospitalizations, ER visits, or surgeries this past year.   Objective:    Today's Vitals   01/20/18 1415  BP: (!) 141/70  Pulse: 61  Weight: 110 lb (49.9 kg)  Height: 5\' 3"  (1.6 m)   Body mass index is 19.49 kg/m.  Advanced Directives 01/20/2018  Does Patient Have a Medical Advance Directive? Yes  Type of Advance Directive Living will;Healthcare Power of Attorney  Does patient want to make changes to medical advance directive? No - Patient declined  Copy of [place] in Chart? No - copy requested    Hearing/Vision  No hearing or vision deficits noted during visit.  Cognitive Function: MMSE - Mini Mental State Exam 01/20/2018  Orientation to time 5  Orientation to Place 5  Registration 3  Attention/ Calculation 5  Recall 3  Language- name 2 objects 2  Language- repeat 1  Language- follow 3 step command 3  Language- read & follow direction 1  Write a sentence 1  Copy design 1  Total score 30       Normal Cognitive Function Screening: Yes    Immunizations and Health Maintenance Immunization History  Administered Date(s) Administered  . Influenza Split 06/13/2013  . Influenza, Seasonal, Injecte, Preservative Fre 03/31/2015  . Pneumococcal Conjugate-13 06/13/2013  . Pneumococcal Polysaccharide-23 04/17/2010  . Td 04/27/2003  . Tdap 06/13/2013   Health Maintenance Due  Topic Date Due  . Hepatitis C Screening  [birth year]   Health Maintenance  Topic Date Due  .  Hepatitis C Screening  15-Aug-1945  . INFLUENZA VACCINE  11/25/2018 (Originally 11/24/2017)  . COLONOSCOPY  09/29/2026 (Originally 08/29/2016)  . MAMMOGRAM  07/20/2018  . DEXA SCAN  05/20/2019  . TETANUS/TDAP  06/14/2023  . PNA vac Low Risk Adult  Completed        Assessment:   This is a routine wellness examination for Militza.    Plan:    Goals    . Exercise 150 min/wk Moderate Activity        Health Maintenance Recommendations: Influenza vaccine Screening mammography Colorectal cancer screening   Additional Screening Recommendations: Lung: Low Dose CT Chest recommended if Age 31-80 years, 30 pack-year currently smoking OR have quit w/in 15years. Patient does not qualify. Hepatitis C Screening recommended: yes  Keep f/u with Eustaquio Maize, MD and any other specialty appointments you may have Continue current medications Move carefully to avoid falls. Use assistive devices like a cane or walker if needed. Aim for at least 150 minutes of moderate activity a week. This can be done with chair exercises if necessary. Read or work on puzzles daily Stay connected with friends and family  I have personally reviewed and noted the following in the patient's chart:   . Medical and social history . Use of alcohol, tobacco or illicit drugs  . Current medications and supplements . Functional ability and status . Nutritional status . Physical activity . Advanced directives . List of other physicians . Hospitalizations, surgeries, and ER visits in previous 12 months . Vitals . Screenings to include cognitive, depression, and falls . Referrals and appointments  In addition, I have reviewed and discussed with patient certain preventive protocols, quality metrics, and best practice recommendations. A written personalized care plan for preventive services as well as general preventive health recommendations were provided to patient.  Chong Sicilian, RN   01/20/2018    I have  reviewed and agree with the above AWV documentation.  Claretta Fraise, M.D.

## 2018-02-20 ENCOUNTER — Other Ambulatory Visit: Payer: Self-pay | Admitting: Pediatrics

## 2018-02-20 DIAGNOSIS — F419 Anxiety disorder, unspecified: Secondary | ICD-10-CM

## 2018-03-06 ENCOUNTER — Other Ambulatory Visit: Payer: Self-pay | Admitting: Pediatrics

## 2018-03-06 DIAGNOSIS — I10 Essential (primary) hypertension: Secondary | ICD-10-CM

## 2018-04-17 ENCOUNTER — Encounter: Payer: Self-pay | Admitting: Pediatrics

## 2018-04-17 ENCOUNTER — Ambulatory Visit (INDEPENDENT_AMBULATORY_CARE_PROVIDER_SITE_OTHER): Payer: Medicare Other | Admitting: Pediatrics

## 2018-04-17 DIAGNOSIS — I10 Essential (primary) hypertension: Secondary | ICD-10-CM | POA: Diagnosis not present

## 2018-04-17 DIAGNOSIS — F419 Anxiety disorder, unspecified: Secondary | ICD-10-CM | POA: Diagnosis not present

## 2018-04-17 DIAGNOSIS — E785 Hyperlipidemia, unspecified: Secondary | ICD-10-CM

## 2018-04-17 MED ORDER — PRAVASTATIN SODIUM 20 MG PO TABS: 20.0000 mg | ORAL_TABLET | Freq: Every day | ORAL | 1 refills | Status: DC

## 2018-04-17 MED ORDER — AMLODIPINE BESYLATE 5 MG PO TABS: 5.0000 mg | ORAL_TABLET | Freq: Every day | ORAL | 1 refills | Status: DC

## 2018-04-17 MED ORDER — IRBESARTAN 300 MG PO TABS: 300.0000 mg | ORAL_TABLET | Freq: Every day | ORAL | 1 refills | Status: DC

## 2018-04-17 MED ORDER — ESCITALOPRAM OXALATE 5 MG PO TABS: 5.0000 mg | ORAL_TABLET | Freq: Every day | ORAL | 1 refills | Status: DC

## 2018-04-17 NOTE — Patient Instructions (Addendum)
Virtual Behavioral Health Contact: [phone]  Chong Sicilian is our nurse here who helps [place] with prolia paperwork

## 2018-04-17 NOTE — Progress Notes (Signed)
  Subjective:   Patient ID: Mary [name], female    DOB: Mar 02, 1946, 72 y.o.   MRN: [id number] CC: Medical Management of Chronic Issues  HPI: Mary [name] is a 72 y.o. female   Osteoporosis: Side effects with bisphosphonates.  Has paperwork from [facility] to help with Prolia cost.  Has questions about the paperwork.  Anxiety: Tough time of year now, husband passed away a year ago.  Taking Lexapro daily.  She does think that is helping with both mood and anxiety  Hypertension: Blood pressures recently have been elevated at home, she will feel off, symptoms of the headache, some shortness of breath, check her blood pressure and it is up to the 465K to 812X systolics over 51Z to 001V.  Taking blood pressure medicine regularly.  Not checking blood pressure when she is feeling well and normal.  Hyperlipidemia: Tolerating statin  Relevant past medical, surgical, family and social history reviewed. Allergies and medications reviewed and updated. Social History   Tobacco Use  Smoking Status Never Smoker  Smokeless Tobacco Never Used   ROS: Per HPI   Objective:    BP 135/78   Pulse 61   Temp (!) 97 F (36.1 C) (Oral)   Ht 5\' 3"  (1.6 m)   Wt 110 lb (49.9 kg)   BMI 19.49 kg/m   Wt Readings from Last 3 Encounters:  04/17/18 110 lb (49.9 kg)  01/20/18 110 lb (49.9 kg)  01/16/18 111 lb (50.3 kg)    Gen: NAD, alert, cooperative with exam, NCAT EYES: EOMI, no conjunctival injection, or no icterus ENT:  TMs pearly gray b/l, OP without erythema LYMPH: no cervical LAD CV: NRRR, normal S1/S2, no murmur, distal pulses 2+ b/l Resp: CTABL, no wheezes, normal WOB Abd: +BS, soft, NTND. no guarding or organomegaly Ext: No edema, warm Neuro: Alert and oriented, strength equal b/l UE and LE, coordination grossly normal MSK: normal muscle bulk  Assessment & Plan:  [name] was seen today for medical management of chronic issues.  Diagnoses and all orders for this visit:  Essential  hypertension Persistently elevated numbers at home.  Increase amlodipine to 5 mg.  Check blood pressures every day at the same time.  Write down numbers, bring to next clinic visit.  Also check blood pressures when feeling off. -     irbesartan (AVAPRO) 300 MG tablet; Take 1 tablet (300 mg total) by mouth daily. -     amLODipine (NORVASC) 5 MG tablet; Take 1 tablet (5 mg total) by mouth daily.  Anxiety Some ongoing symptoms, continue below.  May need increase in future.  Gave information for virtual behavioral health contact. -     escitalopram (LEXAPRO) 5 MG tablet; Take 1 tablet (5 mg total) by mouth daily.  Hyperlipidemia, unspecified hyperlipidemia type Stable, continue below -     pravastatin (PRAVACHOL) 20 MG tablet; Take 1 tablet (20 mg total) by mouth daily.   Follow up plan: Return in about 5 weeks (around 05/22/2018).,  Blood pressure, anxiety Mary Found, MD [place]

## 2018-05-22 ENCOUNTER — Ambulatory Visit: Payer: Medicare Other | Admitting: Pediatrics

## 2018-05-25 ENCOUNTER — Encounter: Payer: Self-pay | Admitting: Family Medicine

## 2018-05-25 ENCOUNTER — Ambulatory Visit (INDEPENDENT_AMBULATORY_CARE_PROVIDER_SITE_OTHER): Payer: Medicare Other | Admitting: Family Medicine

## 2018-05-25 VITALS — BP 127/71 | HR 61 | Temp 97.8°F | Ht 63.0 in | Wt 110.0 lb

## 2018-05-25 DIAGNOSIS — Z1159 Encounter for screening for other viral diseases: Secondary | ICD-10-CM | POA: Diagnosis not present

## 2018-05-25 DIAGNOSIS — Z1211 Encounter for screening for malignant neoplasm of colon: Secondary | ICD-10-CM | POA: Diagnosis not present

## 2018-05-25 DIAGNOSIS — Z1212 Encounter for screening for malignant neoplasm of rectum: Secondary | ICD-10-CM

## 2018-05-25 DIAGNOSIS — E782 Mixed hyperlipidemia: Secondary | ICD-10-CM

## 2018-05-25 DIAGNOSIS — I10 Essential (primary) hypertension: Secondary | ICD-10-CM | POA: Diagnosis not present

## 2018-05-25 DIAGNOSIS — Z1239 Encounter for other screening for malignant neoplasm of breast: Secondary | ICD-10-CM

## 2018-05-25 NOTE — Patient Instructions (Signed)
DASH Eating Plan  DASH stands for "Dietary Approaches to Stop Hypertension." The DASH eating plan is a healthy eating plan that has been shown to reduce high blood pressure (hypertension). It may also reduce your risk for type 2 diabetes, heart disease, and stroke. The DASH eating plan may also help with weight loss.  What are tips for following this plan?    General guidelines   Avoid eating more than 2,300 mg (milligrams) of salt (sodium) a day. If you have hypertension, you may need to reduce your sodium intake to 1,500 mg a day.   Limit alcohol intake to no more than 1 drink a day for nonpregnant women and 2 drinks a day for men. One drink equals 12 oz of beer, 5 oz of wine, or 1 oz of hard liquor.   Work with your health care provider to maintain a healthy body weight or to lose weight. Ask what an ideal weight is for you.   Get at least 30 minutes of exercise that causes your heart to beat faster (aerobic exercise) most days of the week. Activities may include walking, swimming, or biking.   Work with your health care provider or diet and nutrition specialist (dietitian) to adjust your eating plan to your individual calorie needs.  Reading food labels     Check food labels for the amount of sodium per serving. Choose foods with less than 5 percent of the Daily Value of sodium. Generally, foods with less than 300 mg of sodium per serving fit into this eating plan.   To find whole grains, look for the word "whole" as the first word in the ingredient list.  Shopping   Buy products labeled as "low-sodium" or "no salt added."   Buy fresh foods. Avoid canned foods and premade or frozen meals.  Cooking   Avoid adding salt when cooking. Use salt-free seasonings or herbs instead of table salt or sea salt. Check with your health care provider or pharmacist before using salt substitutes.   Do not fry foods. Cook foods using healthy methods such as baking, boiling, grilling, and broiling instead.   Cook with  heart-healthy oils, such as olive, canola, soybean, or sunflower oil.  Meal planning   Eat a balanced diet that includes:  ? 5 or more servings of fruits and vegetables each day. At each meal, try to fill half of your plate with fruits and vegetables.  ? Up to 6-8 servings of whole grains each day.  ? Less than 6 oz of lean meat, poultry, or fish each day. A 3-oz serving of meat is about the same size as a deck of cards. One egg equals 1 oz.  ? 2 servings of low-fat dairy each day.  ? A serving of nuts, seeds, or beans 5 times each week.  ? Heart-healthy fats. Healthy fats called Omega-3 fatty acids are found in foods such as flaxseeds and coldwater fish, like sardines, salmon, and mackerel.   Limit how much you eat of the following:  ? Canned or prepackaged foods.  ? Food that is high in trans fat, such as fried foods.  ? Food that is high in saturated fat, such as fatty meat.  ? Sweets, desserts, sugary drinks, and other foods with added sugar.  ? Full-fat dairy products.   Do not salt foods before eating.   Try to eat at least 2 vegetarian meals each week.   Eat more home-cooked food and less restaurant, buffet, and fast food.     When eating at a restaurant, ask that your food be prepared with less salt or no salt, if possible.  What foods are recommended?  The items listed may not be a complete list. Talk with your dietitian about what dietary choices are best for you.  Grains  Whole-grain or whole-wheat bread. Whole-grain or whole-wheat pasta. Brown rice. Oatmeal. Quinoa. Bulgur. Whole-grain and low-sodium cereals. Pita bread. Low-fat, low-sodium crackers. Whole-wheat flour tortillas.  Vegetables  Fresh or frozen vegetables (raw, steamed, roasted, or grilled). Low-sodium or reduced-sodium tomato and vegetable juice. Low-sodium or reduced-sodium tomato sauce and tomato paste. Low-sodium or reduced-sodium canned vegetables.  Fruits  All fresh, dried, or frozen fruit. Canned fruit in natural juice (without  added sugar).  Meat and other protein foods  Skinless chicken or turkey. Ground chicken or turkey. Pork with fat trimmed off. Fish and seafood. Egg whites. Dried beans, peas, or lentils. Unsalted nuts, nut butters, and seeds. Unsalted canned beans. Lean cuts of beef with fat trimmed off. Low-sodium, lean deli meat.  Dairy  Low-fat (1%) or fat-free (skim) milk. Fat-free, low-fat, or reduced-fat cheeses. Nonfat, low-sodium ricotta or cottage cheese. Low-fat or nonfat yogurt. Low-fat, low-sodium cheese.  Fats and oils  Soft margarine without trans fats. Vegetable oil. Low-fat, reduced-fat, or light mayonnaise and salad dressings (reduced-sodium). Canola, safflower, olive, soybean, and sunflower oils. Avocado.  Seasoning and other foods  Herbs. Spices. Seasoning mixes without salt. Unsalted popcorn and pretzels. Fat-free sweets.  What foods are not recommended?  The items listed may not be a complete list. Talk with your dietitian about what dietary choices are best for you.  Grains  Baked goods made with fat, such as croissants, muffins, or some breads. Dry pasta or rice meal packs.  Vegetables  Creamed or fried vegetables. Vegetables in a cheese sauce. Regular canned vegetables (not low-sodium or reduced-sodium). Regular canned tomato sauce and paste (not low-sodium or reduced-sodium). Regular tomato and vegetable juice (not low-sodium or reduced-sodium). Pickles. Olives.  Fruits  Canned fruit in a light or heavy syrup. Fried fruit. Fruit in cream or butter sauce.  Meat and other protein foods  Fatty cuts of meat. Ribs. Fried meat. Bacon. Sausage. Bologna and other processed lunch meats. Salami. Fatback. Hotdogs. Bratwurst. Salted nuts and seeds. Canned beans with added salt. Canned or smoked fish. Whole eggs or egg yolks. Chicken or turkey with skin.  Dairy  Whole or 2% milk, cream, and half-and-half. Whole or full-fat cream cheese. Whole-fat or sweetened yogurt. Full-fat cheese. Nondairy creamers. Whipped toppings.  Processed cheese and cheese spreads.  Fats and oils  Butter. Stick margarine. Lard. Shortening. Ghee. Bacon fat. Tropical oils, such as coconut, palm kernel, or palm oil.  Seasoning and other foods  Salted popcorn and pretzels. Onion salt, garlic salt, seasoned salt, table salt, and sea salt. Worcestershire sauce. Tartar sauce. Barbecue sauce. Teriyaki sauce. Soy sauce, including reduced-sodium. Steak sauce. Canned and packaged gravies. Fish sauce. Oyster sauce. Cocktail sauce. Horseradish that you find on the shelf. Ketchup. Mustard. Meat flavorings and tenderizers. Bouillon cubes. Hot sauce and Tabasco sauce. Premade or packaged marinades. Premade or packaged taco seasonings. Relishes. Regular salad dressings.  Where to find more information:   National Heart, Lung, and Blood Institute: www.nhlbi.nih.gov   American Heart Association: www.heart.org  Summary   The DASH eating plan is a healthy eating plan that has been shown to reduce high blood pressure (hypertension). It may also reduce your risk for type 2 diabetes, heart disease, and stroke.   With the   DASH eating plan, you should limit salt (sodium) intake to 2,300 mg a day. If you have hypertension, you may need to reduce your sodium intake to 1,500 mg a day.   When on the DASH eating plan, aim to eat more fresh fruits and vegetables, whole grains, lean proteins, low-fat dairy, and heart-healthy fats.   Work with your health care provider or diet and nutrition specialist (dietitian) to adjust your eating plan to your individual calorie needs.  This information is not intended to replace advice given to you by your health care provider. Make sure you discuss any questions you have with your health care provider.  Document Released: 04/01/2011 Document Revised: 04/05/2016 Document Reviewed: 04/05/2016  [employer]vier Interactive Patient Education  2019 [employer]

## 2018-05-25 NOTE — Progress Notes (Signed)
Subjective:    Patient ID: Mary [name], female    DOB: Sep 07, 1945, 73 y.o.   MRN: [id number]  Chief Complaint:  Hypertension (folllowup; had increased Amlodipine)   HPI: Mary [name] is a 73 y.o. female presenting on 05/25/2018 for Hypertension (folllowup; had increased Amlodipine)   1. Essential hypertension with goal blood pressure less than 140/90  Complaint with meds - Yes Checking BP at home ranging 110-120's / 70-80's Exercising Regularly - Yes Watching Salt intake - Yes Pertinent ROS:  Headache - No Chest pain - No Dyspnea - No Palpitations - No LE edema - Slight left ankle swelling when on feet all day.  They report good compliance with medications and can restate their regimen by memory. No medication side effects.  BP Readings from Last 3 Encounters:  05/25/18 127/71  04/17/18 135/78  01/20/18 (!) 141/70     2. Mixed hyperlipidemia  Compliant with medications. Denies adverse side effects. Does watch diet and exercise on a regular basis.      Relevant past medical, surgical, family, and social history reviewed and updated as indicated.  Allergies and medications reviewed and updated.   Past Medical History:  Diagnosis Date  . Anxiety   . Arthritis   . Hypertension   . Irregular heart beat     Past Surgical History:  Procedure Laterality Date  . ABDOMINAL HYSTERECTOMY    . KNEE ARTHROSCOPY    . TONSILLECTOMY      Social History   Socioeconomic History  . Marital status: Married    Spouse name: Not on file  . Number of children: 3  . Years of education: 26  . Highest education level: 12th grade  Occupational History  . Not on file  Social Needs  . Financial resource strain: Not hard at all  . Food insecurity:    Worry: Never true    Inability: Never true  . Transportation needs:    Medical: No    Non-medical: No  Tobacco Use  . Smoking status: Never Smoker  . Smokeless tobacco: Never Used  Substance and Sexual Activity    . Alcohol use: Yes    Alcohol/week: 7.0 standard drinks    Types: 7 Glasses of wine per week  . Drug use: No  . Sexual activity: Not Currently  Lifestyle  . Physical activity:    Days per week: 7 days    Minutes per session: 30 min  . Stress: Only a little  Relationships  . Social connections:    Talks on phone: More than three times a week    Gets together: More than three times a week    Attends religious service: More than 4 times per year    Active member of club or organization: Yes    Attends meetings of clubs or organizations: More than 4 times per year    Relationship status: Widowed  . Intimate partner violence:    Fear of current or ex partner: No    Emotionally abused: No    Physically abused: No    Forced sexual activity: No  Other Topics Concern  . Not on file  Social History Narrative  . Not on file    Outpatient Encounter Medications as of 05/25/2018  Medication Sig  . amLODipine (NORVASC) 5 MG tablet Take 1 tablet (5 mg total) by mouth daily.  [name] escitalopram (LEXAPRO) 5 MG tablet Take 1 tablet (5 mg total) by mouth daily.  . flecainide (TAMBOCOR) 100 MG tablet  Take 100 mg by mouth 2 (two) times daily.   . irbesartan (AVAPRO) 300 MG tablet Take 1 tablet (300 mg total) by mouth daily.  . metoprolol succinate (TOPROL-XL) 50 MG 24 hr tablet Take 50 mg by mouth daily.   . pravastatin (PRAVACHOL) 20 MG tablet Take 1 tablet (20 mg total) by mouth daily.   No facility-administered encounter medications on file as of 05/25/2018.     Allergies  Allergen Reactions  . Fosamax [Alendronate Sodium]     Dizziness, elevated blood pressure  . Sertraline Nausea Only    Insomnia, did not feel well  . Sulfa Antibiotics Nausea Only    Review of Systems  Constitutional: Negative for chills, fatigue and fever.  Eyes: Negative for photophobia and visual disturbance.  Respiratory: Negative for cough, chest tightness and shortness of breath.   Cardiovascular: Positive for  leg swelling. Negative for chest pain and palpitations.  Gastrointestinal: Negative for abdominal pain, anal bleeding, blood in stool, constipation, diarrhea, nausea, rectal pain and vomiting.  Musculoskeletal: Negative for myalgias.  Neurological: Negative for dizziness, tremors, seizures, syncope, facial asymmetry, speech difficulty, weakness, light-headedness, numbness and headaches.  Psychiatric/Behavioral: Negative for confusion.  All other systems reviewed and are negative.       Objective:    BP 127/71   Pulse 61   Temp 97.8 F (36.6 C) (Oral)   Ht '5\' 3"'  (1.6 m)   Wt 110 lb (49.9 kg)   BMI 19.49 kg/m    Wt Readings from Last 3 Encounters:  05/25/18 110 lb (49.9 kg)  04/17/18 110 lb (49.9 kg)  01/20/18 110 lb (49.9 kg)    Physical Exam Vitals signs and nursing note reviewed.  Constitutional:      General: She is not in acute distress.    Appearance: Normal appearance. She is well-developed, well-groomed and normal weight. She is not ill-appearing.  HENT:     Head: Normocephalic and atraumatic.     Jaw: There is normal jaw occlusion.     Right Ear: Hearing normal.     Left Ear: Hearing normal.     Mouth/Throat:     Mouth: Mucous membranes are moist.  Eyes:     General: Lids are normal.     Extraocular Movements: Extraocular movements intact.     Conjunctiva/sclera: Conjunctivae normal.     Pupils: Pupils are equal, round, and reactive to light.  Neck:     Musculoskeletal: Normal range of motion and neck supple. No neck rigidity or muscular tenderness.     Thyroid: No thyroid mass, thyromegaly or thyroid tenderness.     Vascular: No carotid bruit or JVD.  Cardiovascular:     Rate and Rhythm: Normal rate. Rhythm regularly irregular.     Heart sounds: No murmur. No friction rub. No gallop.   Pulmonary:     Effort: Pulmonary effort is normal.     Breath sounds: Normal breath sounds.  Chest:     Breasts:        Right: Normal.        Left: Normal.  Abdominal:       General: Bowel sounds are normal.     Palpations: Abdomen is soft.     Tenderness: There is no abdominal tenderness.  Musculoskeletal:     Right lower leg: No edema.     Left lower leg: 1+ Edema present.  Lymphadenopathy:     Cervical: No cervical adenopathy.  Skin:    General: Skin is warm and dry.  Capillary Refill: Capillary refill takes less than 2 seconds.  Neurological:     General: No focal deficit present.     Mental Status: She is alert and oriented to person, place, and time.     Cranial Nerves: Cranial nerves are intact.     Sensory: Sensation is intact.     Motor: Motor function is intact.     Coordination: Coordination is intact.     Gait: Gait is intact.     Deep Tendon Reflexes: Reflexes are normal and symmetric.  Psychiatric:        Mood and Affect: Mood normal.        Behavior: Behavior normal. Behavior is cooperative.        Thought Content: Thought content normal.        Judgment: Judgment normal.     Results for orders placed or performed in visit on 09/26/17  CBC with Differential/Platelet  Result Value Ref Range   WBC 8.0 3.4 - 10.8 x10E3/uL   RBC 4.14 3.77 - 5.28 x10E6/uL   Hemoglobin 12.5 11.1 - 15.9 g/dL   Hematocrit 36.1 34.0 - 46.6 %   MCV 87 79 - 97 fL   MCH 30.2 26.6 - 33.0 pg   MCHC 34.6 31.5 - 35.7 g/dL   RDW 13.0 12.3 - 15.4 %   Platelets 304 150 - 450 x10E3/uL   Neutrophils 70 Not Estab. %   Lymphs 19 Not Estab. %   Monocytes 9 Not Estab. %   Eos 1 Not Estab. %   Basos 1 Not Estab. %   Neutrophils Absolute 5.7 1.4 - 7.0 x10E3/uL   Lymphocytes Absolute 1.5 0.7 - 3.1 x10E3/uL   Monocytes Absolute 0.7 0.1 - 0.9 x10E3/uL   EOS (ABSOLUTE) 0.1 0.0 - 0.4 x10E3/uL   Basophils Absolute 0.1 0.0 - 0.2 x10E3/uL   Immature Granulocytes 0 Not Estab. %   Immature Grans (Abs) 0.0 0.0 - 0.1 x10E3/uL  TSH  Result Value Ref Range   TSH 1.660 0.450 - 4.500 uIU/mL  CMP14+EGFR  Result Value Ref Range   Glucose 94 65 - 99 mg/dL   BUN 6 (L) 8  - 27 mg/dL   Creatinine, Ser 0.60 0.57 - 1.00 mg/dL   GFR calc non Af Amer 91 >59 mL/min/1.73   GFR calc Af Amer 105 >59 mL/min/1.73   BUN/Creatinine Ratio 10 (L) 12 - 28   Sodium 134 134 - 144 mmol/L   Potassium 4.3 3.5 - 5.2 mmol/L   Chloride 93 (L) 96 - 106 mmol/L   CO2 26 20 - 29 mmol/L   Calcium 9.3 8.7 - 10.3 mg/dL   Total Protein 7.0 6.0 - 8.5 g/dL   Albumin 5.0 (H) 3.5 - 4.8 g/dL   Globulin, Total 2.0 1.5 - 4.5 g/dL   Albumin/Globulin Ratio 2.5 (H) 1.2 - 2.2   Bilirubin Total 0.4 0.0 - 1.2 mg/dL   Alkaline Phosphatase 71 39 - 117 IU/L   AST 22 0 - 40 IU/L   ALT 12 0 - 32 IU/L  VITAMIN D 25 Hydroxy (Vit-D Deficiency, Fractures)  Result Value Ref Range   Vit D, 25-Hydroxy 25.6 (L) 30.0 - 100.0 ng/mL       Pertinent labs & imaging results that were available during my care of the patient were reviewed by me and considered in my medical decision making.  Assessment & Plan:  Mary [name] was seen today for hypertension.  Diagnoses and all orders for this visit:  Essential hypertension with goal  blood pressure less than 140/90 Doing well since increasing Norvasc. Minimal ankle swelling. Labs pending. Continue medications as prescribed.  -     CMP14+EGFR -     CBC with Differential/Platelet -     Microalbumin / creatinine urine ratio -     Lipid panel  Screening for colorectal cancer Does not want a colonoscopy. Will complete cologuard. Denies first degree relative history of colorectal cancer. Denies abnormal colonoscopy in the past.  -     Cologuard  Mixed hyperlipidemia Diet and exercise encouraged. Continue medications as prescribed. Labs pending.  -     Lipid panel  Need for hepatitis C screening test One time screening. Will order today.  -     Hepatitis C antibody  Screening for breast cancer Will schedule mammogram on bus. Due for mammogram in March 2020.    Continue all other maintenance medications.  Follow up plan: Return in about 6 months (around  11/23/2018), or if symptoms worsen or fail to improve.  Educational handout given for DASH diet  The above assessment and management plan was discussed with the patient. The patient verbalized understanding of and has agreed to the management plan. Patient is aware to call the clinic if symptoms persist or worsen. Patient is aware when to return to the clinic for a follow-up visit. Patient educated on when it is appropriate to go to the emergency department.   Monia Pouch, FNP-C [facility] [phone]

## 2018-05-26 LAB — LIPID PANEL
Chol/HDL Ratio: 1.5 ratio (ref 0.0–4.4)
Cholesterol, Total: 164 mg/dL (ref 100–199)
HDL: 107 mg/dL (ref >39)
LDL CALC: 48 mg/dL (ref 0–99)
Triglycerides: 45 mg/dL (ref 0–149)
VLDL CHOLESTEROL CAL: 9 mg/dL (ref 5–40)

## 2018-05-26 LAB — MICROALBUMIN / CREATININE URINE RATIO
Creatinine, Urine: 10.9 mg/dL
Microalb/Creat Ratio: <28 mg/g creat (ref 0–29)
Microalbumin, Urine: <3 ug/mL

## 2018-05-26 LAB — CMP14+EGFR
A/G RATIO: 2.5 — AB (ref 1.2–2.2)
ALT: 14 IU/L (ref 0–32)
AST: 22 IU/L (ref 0–40)
Albumin: 4.7 g/dL (ref 3.7–4.7)
Alkaline Phosphatase: 73 IU/L (ref 39–117)
BUN/Creatinine Ratio: 14 (ref 12–28)
BUN: 8 mg/dL (ref 8–27)
Bilirubin Total: 0.3 mg/dL (ref 0.0–1.2)
CO2: 25 mmol/L (ref 20–29)
CREATININE: 0.57 mg/dL (ref 0.57–1.00)
Calcium: 9.1 mg/dL (ref 8.7–10.3)
Chloride: 86 mmol/L — ABNORMAL LOW (ref 96–106)
GFR calc non Af Amer: 92 mL/min/{1.73_m2} (ref >59)
GFR, EST AFRICAN AMERICAN: 106 mL/min/{1.73_m2} (ref >59)
GLOBULIN, TOTAL: 1.9 g/dL (ref 1.5–4.5)
Glucose: 84 mg/dL (ref 65–99)
POTASSIUM: 4.5 mmol/L (ref 3.5–5.2)
Sodium: 126 mmol/L — ABNORMAL LOW (ref 134–144)
TOTAL PROTEIN: 6.6 g/dL (ref 6.0–8.5)

## 2018-05-26 LAB — CBC WITH DIFFERENTIAL/PLATELET
Basophils Absolute: 0.1 10*3/uL (ref 0.0–0.2)
Basos: 1 %
EOS (ABSOLUTE): 0.1 10*3/uL (ref 0.0–0.4)
Eos: 2 %
Hematocrit: 36.6 % (ref 34.0–46.6)
Hemoglobin: 12.4 g/dL (ref 11.1–15.9)
Immature Grans (Abs): 0 10*3/uL (ref 0.0–0.1)
Immature Granulocytes: 0 %
LYMPHS ABS: 1.5 10*3/uL (ref 0.7–3.1)
Lymphs: 17 %
MCH: 30.3 pg (ref 26.6–33.0)
MCHC: 33.9 g/dL (ref 31.5–35.7)
MCV: 90 fL (ref 79–97)
MONOS ABS: 1 10*3/uL — AB (ref 0.1–0.9)
Monocytes: 12 %
Neutrophils Absolute: 6 10*3/uL (ref 1.4–7.0)
Neutrophils: 68 %
Platelets: 302 10*3/uL (ref 150–450)
RBC: 4.09 x10E6/uL (ref 3.77–5.28)
RDW: 11.5 % — ABNORMAL LOW (ref 11.7–15.4)
WBC: 8.7 10*3/uL (ref 3.4–10.8)

## 2018-05-26 LAB — HEPATITIS C ANTIBODY: Hep C Virus Ab: <0.1 s/co ratio (ref 0.0–0.9)

## 2018-06-01 ENCOUNTER — Ambulatory Visit: Payer: Medicare Other | Admitting: Family Medicine

## 2018-06-03 LAB — COLOGUARD: Cologuard: NEGATIVE

## 2018-06-05 ENCOUNTER — Other Ambulatory Visit: Payer: Medicare Other

## 2018-06-05 DIAGNOSIS — E878 Other disorders of electrolyte and fluid balance, not elsewhere classified: Secondary | ICD-10-CM

## 2018-06-05 DIAGNOSIS — E871 Hypo-osmolality and hyponatremia: Secondary | ICD-10-CM

## 2018-06-06 LAB — BMP8+EGFR
BUN/Creatinine Ratio: 17 (ref 12–28)
BUN: 9 mg/dL (ref 8–27)
CO2: 26 mmol/L (ref 20–29)
Calcium: 9.2 mg/dL (ref 8.7–10.3)
Chloride: 88 mmol/L — ABNORMAL LOW (ref 96–106)
Creatinine, Ser: 0.53 mg/dL — ABNORMAL LOW (ref 0.57–1.00)
GFR calc Af Amer: 109 mL/min/{1.73_m2} (ref >59)
GFR calc non Af Amer: 94 mL/min/{1.73_m2} (ref >59)
GLUCOSE: 92 mg/dL (ref 65–99)
Potassium: 4.9 mmol/L (ref 3.5–5.2)
Sodium: 127 mmol/L — ABNORMAL LOW (ref 134–144)

## 2018-06-19 ENCOUNTER — Other Ambulatory Visit: Payer: Medicare Other

## 2018-06-19 ENCOUNTER — Telehealth: Payer: Self-pay | Admitting: Family Medicine

## 2018-06-19 DIAGNOSIS — E871 Hypo-osmolality and hyponatremia: Secondary | ICD-10-CM

## 2018-06-19 NOTE — Telephone Encounter (Signed)
Patient aware of negative cologuard results. 

## 2018-06-20 LAB — BMP8+EGFR
BUN/Creatinine Ratio: 15 (ref 12–28)
BUN: 8 mg/dL (ref 8–27)
CO2: 25 mmol/L (ref 20–29)
CREATININE: 0.52 mg/dL — AB (ref 0.57–1.00)
Calcium: 9.2 mg/dL (ref 8.7–10.3)
Chloride: 87 mmol/L — ABNORMAL LOW (ref 96–106)
GFR calc Af Amer: 110 mL/min/{1.73_m2} (ref >59)
GFR calc non Af Amer: 95 mL/min/{1.73_m2} (ref >59)
Glucose: 83 mg/dL (ref 65–99)
Potassium: 4.5 mmol/L (ref 3.5–5.2)
Sodium: 128 mmol/L — ABNORMAL LOW (ref 134–144)

## 2018-06-29 DIAGNOSIS — L659 Nonscarring hair loss, unspecified: Secondary | ICD-10-CM | POA: Diagnosis not present

## 2018-06-29 DIAGNOSIS — D485 Neoplasm of uncertain behavior of skin: Secondary | ICD-10-CM | POA: Diagnosis not present

## 2018-06-29 DIAGNOSIS — L570 Actinic keratosis: Secondary | ICD-10-CM | POA: Diagnosis not present

## 2018-07-06 ENCOUNTER — Other Ambulatory Visit: Payer: Self-pay

## 2018-07-06 ENCOUNTER — Ambulatory Visit (INDEPENDENT_AMBULATORY_CARE_PROVIDER_SITE_OTHER): Payer: Medicare Other | Admitting: Family Medicine

## 2018-07-06 ENCOUNTER — Encounter: Payer: Self-pay | Admitting: Family Medicine

## 2018-07-06 VITALS — BP 133/74 | HR 59 | Temp 97.6°F | Ht 63.0 in | Wt 112.0 lb

## 2018-07-06 DIAGNOSIS — E871 Hypo-osmolality and hyponatremia: Secondary | ICD-10-CM | POA: Diagnosis not present

## 2018-07-06 DIAGNOSIS — R5383 Other fatigue: Secondary | ICD-10-CM | POA: Diagnosis not present

## 2018-07-06 DIAGNOSIS — L659 Nonscarring hair loss, unspecified: Secondary | ICD-10-CM

## 2018-07-06 NOTE — Progress Notes (Signed)
Subjective:  Patient ID: Mary [name], female    DOB: 21-Aug-1945, 73 y.o.   MRN: [id number]  Chief Complaint:  Discuss labwork   HPI: Mary [name] is a 73 y.o. female presenting on 07/06/2018 for Discuss labwork  Pt presents today for follow up of lab work. Pt sodium has been trending down since 09/2017 after initiation of Lexapro. Pt denies confusion, weakness, headaches, swelling, chest pain, palpitations, visual disturbances, or dizziness. She does report fatigue and hair loss. States the hair loss started over a year ago and is getting worse. She denies rashes or other skin changes.   Relevant past medical, surgical, family, and social history reviewed and updated as indicated.  Allergies and medications reviewed and updated.   Past Medical History:  Diagnosis Date  . Anxiety   . Arthritis   . Hypertension   . Irregular heart beat     Past Surgical History:  Procedure Laterality Date  . ABDOMINAL HYSTERECTOMY    . KNEE ARTHROSCOPY    . TONSILLECTOMY      Social History   Socioeconomic History  . Marital status: Married    Spouse name: Not on file  . Number of children: 3  . Years of education: 66  . Highest education level: 12th grade  Occupational History  . Not on file  Social Needs  . Financial resource strain: Not hard at all  . Food insecurity:    Worry: Never true    Inability: Never true  . Transportation needs:    Medical: No    Non-medical: No  Tobacco Use  . Smoking status: Never Smoker  . Smokeless tobacco: Never Used  Substance and Sexual Activity  . Alcohol use: Yes    Alcohol/week: 7.0 standard drinks    Types: 7 Glasses of wine per week  . Drug use: No  . Sexual activity: Not Currently  Lifestyle  . Physical activity:    Days per week: 7 days    Minutes per session: 30 min  . Stress: Only a little  Relationships  . Social connections:    Talks on phone: More than three times a week    Gets together: More than three times  a week    Attends religious service: More than 4 times per year    Active member of club or organization: Yes    Attends meetings of clubs or organizations: More than 4 times per year    Relationship status: Widowed  . Intimate partner violence:    Fear of current or ex partner: No    Emotionally abused: No    Physically abused: No    Forced sexual activity: No  Other Topics Concern  . Not on file  Social History Narrative  . Not on file    Outpatient Encounter Medications as of 07/06/2018  Medication Sig  . amLODipine (NORVASC) 5 MG tablet Take 1 tablet (5 mg total) by mouth daily.  . Cholecalciferol (VITAMIN D3) 125 MCG (5000 UT) CAPS Take 5,000 Units by mouth.  . escitalopram (LEXAPRO) 5 MG tablet Take 1 tablet (5 mg total) by mouth daily.  . flecainide (TAMBOCOR) 100 MG tablet Take 100 mg by mouth 2 (two) times daily.   . irbesartan (AVAPRO) 300 MG tablet Take 1 tablet (300 mg total) by mouth daily.  . metoprolol succinate (TOPROL-XL) 50 MG 24 hr tablet Take 50 mg by mouth daily.   . Multiple Vitamins-Minerals (MULTIVITAMIN WITH MINERALS) tablet Take 1 tablet by mouth daily.  [name]  pravastatin (PRAVACHOL) 20 MG tablet Take 1 tablet (20 mg total) by mouth daily.   No facility-administered encounter medications on file as of 07/06/2018.     Allergies  Allergen Reactions  . Fosamax [Alendronate Sodium]     Dizziness, elevated blood pressure  . Sertraline Nausea Only    Insomnia, did not feel well  . Sulfa Antibiotics Nausea Only    Review of Systems  Constitutional: Positive for fatigue. Negative for activity change, appetite change, chills, fever and unexpected weight change.  Eyes: Negative for photophobia and visual disturbance.  Respiratory: Negative for cough, chest tightness and shortness of breath.   Cardiovascular: Negative for chest pain, palpitations and leg swelling.  Gastrointestinal: Negative for abdominal pain, constipation, diarrhea, nausea and vomiting.   Endocrine: Negative for cold intolerance, heat intolerance, polydipsia, polyphagia and polyuria.  Genitourinary: Negative for decreased urine volume.  Musculoskeletal: Negative for arthralgias and myalgias.  Skin: Negative for color change, pallor and rash.       Hair loss  Neurological: Negative for dizziness, tremors, seizures, syncope, facial asymmetry, speech difficulty, weakness, light-headedness, numbness and headaches.  Hematological: Negative for adenopathy. Does not bruise/bleed easily.  Psychiatric/Behavioral: Negative for confusion.  All other systems reviewed and are negative.       Objective:  BP 133/74   Pulse (!) 59   Temp 97.6 F (36.4 C) (Oral)   Ht _0  (1.6 m)   Wt 112 lb (50.8 kg)   BMI 19.84 kg/m    Wt Readings from Last 3 Encounters:  07/06/18 112 lb (50.8 kg)  05/25/18 110 lb (49.9 kg)  04/17/18 110 lb (49.9 kg)    Physical Exam Vitals signs and nursing note reviewed.  Constitutional:      General: She is not in acute distress.    Appearance: Normal appearance. She is normal weight. She is not ill-appearing or toxic-appearing.  HENT:     Head: Normocephalic and atraumatic.     Mouth/Throat:     Mouth: Mucous membranes are moist.     Pharynx: Oropharynx is clear.  Eyes:     Conjunctiva/sclera: Conjunctivae normal.     Pupils: Pupils are equal, round, and reactive to light.  Neck:     Musculoskeletal: Normal range of motion and neck supple.  Cardiovascular:     Rate and Rhythm: Normal rate and regular rhythm.     Pulses: Normal pulses.     Heart sounds: Normal heart sounds. No murmur. No friction rub. No gallop.   Pulmonary:     Effort: Pulmonary effort is normal. No respiratory distress.     Breath sounds: Normal breath sounds.  Musculoskeletal: Normal range of motion.  Skin:    General: Skin is warm and dry.     Capillary Refill: Capillary refill takes less than 2 seconds.     Coloration: Skin is not jaundiced or pale.     Findings: No  bruising, erythema, lesion or rash.  Neurological:     General: No focal deficit present.     Mental Status: She is alert and oriented to person, place, and time.     Cranial Nerves: No cranial nerve deficit.     Sensory: No sensory deficit.     Motor: No weakness.     Coordination: Coordination normal.     Gait: Gait normal.     Deep Tendon Reflexes: Reflexes normal.  Psychiatric:        Mood and Affect: Mood normal.  Behavior: Behavior normal.        Thought Content: Thought content normal.        Judgment: Judgment normal.     Results for orders placed or performed in visit on 06/19/18  [facility]  Result Value Ref Range   Glucose 83 65 - 99 mg/dL   BUN 8 8 - 27 mg/dL   Creatinine, Ser 0.52 (L) 0.57 - 1.00 mg/dL   GFR calc non Af Amer 95 >59 mL/min/1.73   GFR calc Af Amer 110 >59 mL/min/1.73   BUN/Creatinine Ratio 15 12 - 28   Sodium 128 (L) 134 - 144 mmol/L   Potassium 4.5 3.5 - 5.2 mmol/L   Chloride 87 (L) 96 - 106 mmol/L   CO2 25 20 - 29 mmol/L   Calcium 9.2 8.7 - 10.3 mg/dL       Pertinent labs & imaging results that were available during my care of the patient were reviewed by me and considered in my medical decision making.  Assessment & Plan:  Renay was seen today for discuss labwork.  Diagnoses and all orders for this visit:  Hyponatremia Hyponatremia likely due to Lexapro. Pt has titrated to 2.5 mg daily and will slowly titrate off of medication. If additional therapy is needed, will trial another SSRI or a SNRI. Will repeat labs today and in 4 weeks to see if improvement has been made.  -     TSH -     CMP14+EGFR -     Zinc -     Ferritin -     Iron and TIBC([facility])  Hair loss Labs pending.  -     TSH -     CMP14+EGFR -     Zinc -     Ferritin -     Iron and TIBC([facility])  Other fatigue Labs pending.  -     TSH -     CMP14+EGFR -     Zinc -     Ferritin -     Iron and TIBC([facility])     Continue all  other maintenance medications.  Follow up plan: Return in about 4 weeks (around 08/03/2018), or if symptoms worsen or fail to improve, for Na.    The above assessment and management plan was discussed with the patient. The patient verbalized understanding of and has agreed to the management plan. Patient is aware to call the clinic if symptoms persist or worsen. Patient is aware when to return to the clinic for a follow-up visit. Patient educated on when it is appropriate to go to the emergency department.   Monia Pouch, FNP-C [facility] [phone]

## 2018-07-08 LAB — CMP14+EGFR
ALT: 17 IU/L (ref 0–32)
AST: 22 IU/L (ref 0–40)
Albumin/Globulin Ratio: 2.7 — ABNORMAL HIGH (ref 1.2–2.2)
Albumin: 4.9 g/dL — ABNORMAL HIGH (ref 3.7–4.7)
Alkaline Phosphatase: 70 IU/L (ref 39–117)
BUN / CREAT RATIO: 10 — AB (ref 12–28)
BUN: 7 mg/dL — ABNORMAL LOW (ref 8–27)
Bilirubin Total: 0.5 mg/dL (ref 0.0–1.2)
CO2: 24 mmol/L (ref 20–29)
Calcium: 9.4 mg/dL (ref 8.7–10.3)
Chloride: 88 mmol/L — ABNORMAL LOW (ref 96–106)
Creatinine, Ser: 0.67 mg/dL (ref 0.57–1.00)
GFR calc non Af Amer: 87 mL/min/{1.73_m2} (ref >59)
GFR, EST AFRICAN AMERICAN: 101 mL/min/{1.73_m2} (ref >59)
Globulin, Total: 1.8 g/dL (ref 1.5–4.5)
Glucose: 77 mg/dL (ref 65–99)
Potassium: 4.7 mmol/L (ref 3.5–5.2)
Sodium: 130 mmol/L — ABNORMAL LOW (ref 134–144)
Total Protein: 6.7 g/dL (ref 6.0–8.5)

## 2018-07-08 LAB — ZINC: Zinc: 90 ug/dL (ref 56–134)

## 2018-07-08 LAB — IRON AND TIBC
Iron Saturation: 27 % (ref 15–55)
Iron: 103 ug/dL (ref 27–139)
Total Iron Binding Capacity: 386 ug/dL (ref 250–450)
UIBC: 283 ug/dL (ref 118–369)

## 2018-07-08 LAB — TSH: TSH: 3.14 u[IU]/mL (ref 0.450–4.500)

## 2018-07-08 LAB — FERRITIN: Ferritin: 36 ng/mL (ref 15–150)

## 2018-07-14 ENCOUNTER — Telehealth: Payer: Self-pay | Admitting: Family Medicine

## 2018-07-14 NOTE — Telephone Encounter (Signed)
Thank you. This may cause constipation, so she needs to increase water and fiber intake.

## 2018-07-14 NOTE — Telephone Encounter (Signed)
Patient aware Ok to take OTC iron supplement

## 2018-07-18 ENCOUNTER — Encounter: Payer: Self-pay | Admitting: *Deleted

## 2018-07-20 ENCOUNTER — Telehealth: Payer: Self-pay | Admitting: Family Medicine

## 2018-07-20 DIAGNOSIS — I10 Essential (primary) hypertension: Secondary | ICD-10-CM

## 2018-07-20 MED ORDER — AMLODIPINE BESYLATE 5 MG PO TABS: 5.0000 mg | ORAL_TABLET | Freq: Every day | ORAL | 1 refills | Status: DC

## 2018-07-20 NOTE — Telephone Encounter (Signed)
Pharmacy [facility]   Pt states that dr Evette Doffing had put the pt on amLODipine (NORVASC) 5 MG tablet but this last time mail order sent her medicine they sent her 2.5 MG tablets. Pt is wanting to speak to nurse to see why it was changed to 2.5

## 2018-07-20 NOTE — Telephone Encounter (Signed)
I think there was a shortage of the 5 mg. If she needs a refill, it can be placed. Thanks.

## 2018-07-20 NOTE — Telephone Encounter (Signed)
Unsure why patient received 2.5 mg amlodipine. Last rx filled is for 5mg  amlodipine.  Refill sent to patients pharmacy.  Advised patient to take two 2.5mg  tablets.

## 2018-08-08 ENCOUNTER — Other Ambulatory Visit: Payer: Self-pay

## 2018-08-08 ENCOUNTER — Encounter: Payer: Self-pay | Admitting: Family Medicine

## 2018-08-08 ENCOUNTER — Ambulatory Visit (INDEPENDENT_AMBULATORY_CARE_PROVIDER_SITE_OTHER): Payer: Medicare Other | Admitting: Family Medicine

## 2018-08-08 VITALS — BP 135/69 | HR 59 | Temp 97.6°F | Ht 63.0 in | Wt 112.0 lb

## 2018-08-08 DIAGNOSIS — E871 Hypo-osmolality and hyponatremia: Secondary | ICD-10-CM

## 2018-08-08 DIAGNOSIS — F419 Anxiety disorder, unspecified: Secondary | ICD-10-CM

## 2018-08-08 MED ORDER — ESCITALOPRAM OXALATE 5 MG PO TABS: 2.5000 mg | ORAL_TABLET | Freq: Every day | ORAL | 1 refills | Status: DC

## 2018-08-08 NOTE — Progress Notes (Signed)
Subjective:  Patient ID: Mary [name], female    DOB: [birth year], 73 y.o.   MRN: [id number]  Chief Complaint:  4 week recheck (hyponatremia)   HPI: Mary [name] is a 73 y.o. female presenting on 08/08/2018 for 4 week recheck (hyponatremia)   1. Hyponatremia  Pt states she has increased the sodium in her diet. States she feels better than she did at her last visit. She states she is not as fatigued. She denies confusion, weakness, dizziness, chest pain, palpitations, headaches, or shortness of breath. States she does have some left ankle swelling but that is normal for her. States it subsides if she elevates her legs.    2. Anxiety  States she is tolerating the 2.5 mg dose of Lexapro well. She states she feels better on the 2.5 mg dose. States her emotions do not feel blunted like they were on the 5 mg dose. States she is able to cry at a sad movie now and was not able to before. She would like to continue the Lexapro and add sodium to her diet because she feels good on the Lexapro.      Relevant past medical, surgical, family, and social history reviewed and updated as indicated.  Allergies and medications reviewed and updated.   Past Medical History:  Diagnosis Date  . Anxiety   . Arthritis   . Hypertension   . Irregular heart beat     Past Surgical History:  Procedure Laterality Date  . ABDOMINAL HYSTERECTOMY    . KNEE ARTHROSCOPY    . TONSILLECTOMY      Social History   Socioeconomic History  . Marital status: Married    Spouse name: Not on file  . Number of children: 3  . Years of education: 48  . Highest education level: 12th grade  Occupational History  . Not on file  Social Needs  . Financial resource strain: Not hard at all  . Food insecurity:    Worry: Never true    Inability: Never true  . Transportation needs:    Medical: No    Non-medical: No  Tobacco Use  . Smoking status: Never Smoker  . Smokeless tobacco: Never Used  Substance and  Sexual Activity  . Alcohol use: Yes    Alcohol/week: 7.0 standard drinks    Types: 7 Glasses of wine per week  . Drug use: No  . Sexual activity: Not Currently  Lifestyle  . Physical activity:    Days per week: 7 days    Minutes per session: 30 min  . Stress: Only a little  Relationships  . Social connections:    Talks on phone: More than three times a week    Gets together: More than three times a week    Attends religious service: More than 4 times per year    Active member of club or organization: Yes    Attends meetings of clubs or organizations: More than 4 times per year    Relationship status: Widowed  . Intimate partner violence:    Fear of current or ex partner: No    Emotionally abused: No    Physically abused: No    Forced sexual activity: No  Other Topics Concern  . Not on file  Social History Narrative  . Not on file    Outpatient Encounter Medications as of 08/08/2018  Medication Sig  . amLODipine (NORVASC) 5 MG tablet Take 1 tablet (5 mg total) by mouth daily.  . Cholecalciferol (  VITAMIN D3) 125 MCG (5000 UT) CAPS Take 5,000 Units by mouth.  . escitalopram (LEXAPRO) 5 MG tablet Take 0.5 tablets (2.5 mg total) by mouth daily.  . flecainide (TAMBOCOR) 100 MG tablet Take 100 mg by mouth 2 (two) times daily.   . irbesartan (AVAPRO) 300 MG tablet Take 1 tablet (300 mg total) by mouth daily.  . metoprolol succinate (TOPROL-XL) 50 MG 24 hr tablet Take 50 mg by mouth daily.   . Multiple Vitamins-Minerals (MULTIVITAMIN WITH MINERALS) tablet Take 1 tablet by mouth daily.  . pravastatin (PRAVACHOL) 20 MG tablet Take 1 tablet (20 mg total) by mouth daily.  . [DISCONTINUED] escitalopram (LEXAPRO) 5 MG tablet Take 1 tablet (5 mg total) by mouth daily.   No facility-administered encounter medications on file as of 08/08/2018.     Allergies  Allergen Reactions  . Fosamax [Alendronate Sodium]     Dizziness, elevated blood pressure  . Sertraline Nausea Only    Insomnia,  did not feel well  . Sulfa Antibiotics Nausea Only    Review of Systems  Constitutional: Negative for activity change, appetite change, chills, fatigue, fever and unexpected weight change.  Respiratory: Negative for cough and shortness of breath.   Cardiovascular: Positive for leg swelling (left ankle). Negative for chest pain and palpitations.  Gastrointestinal: Negative for abdominal pain, constipation, diarrhea, nausea and vomiting.  Endocrine: Negative for cold intolerance, heat intolerance, polydipsia, polyphagia and polyuria.  Genitourinary: Negative for decreased urine volume.  Musculoskeletal: Negative for arthralgias and myalgias.  Skin: Negative for color change, pallor and rash.  Neurological: Negative for dizziness, tremors, seizures, syncope, weakness, light-headedness and headaches.  Psychiatric/Behavioral: Negative for agitation, behavioral problems, confusion, decreased concentration, dysphoric mood, hallucinations, self-injury, sleep disturbance and suicidal ideas. The patient is nervous/anxious. The patient is not hyperactive.   All other systems reviewed and are negative.       Objective:  BP 135/69   Pulse (!) 59   Temp 97.6 F (36.4 C) (Other (Comment))   Ht 5' 3" (1.6 m)   Wt 112 lb (50.8 kg)   BMI 19.84 kg/m    Wt Readings from Last 3 Encounters:  08/08/18 112 lb (50.8 kg)  07/06/18 112 lb (50.8 kg)  05/25/18 110 lb (49.9 kg)    Physical Exam Vitals signs and nursing note reviewed.  Constitutional:      General: She is not in acute distress.    Appearance: Normal appearance. She is normal weight. She is not ill-appearing or toxic-appearing.  HENT:     Head: Normocephalic and atraumatic.     Mouth/Throat:     Mouth: Mucous membranes are moist.  Eyes:     Extraocular Movements: Extraocular movements intact.     Conjunctiva/sclera: Conjunctivae normal.     Pupils: Pupils are equal, round, and reactive to light.  Neck:     Musculoskeletal: Normal  range of motion and neck supple.  Cardiovascular:     Rate and Rhythm: Normal rate and regular rhythm.     Heart sounds: Normal heart sounds. No murmur. No friction rub. No gallop.   Pulmonary:     Effort: Pulmonary effort is normal. No respiratory distress.     Breath sounds: Normal breath sounds.  Musculoskeletal:     Left ankle: She exhibits swelling. She exhibits normal range of motion. No tenderness.  Skin:    General: Skin is warm and dry.     Capillary Refill: Capillary refill takes less than 2 seconds.  Neurological:  General: No focal deficit present.     Mental Status: She is alert and oriented to person, place, and time.     Motor: No weakness.     Gait: Gait normal.  Psychiatric:        Attention and Perception: Attention and perception normal.        Mood and Affect: Mood and affect normal.        Speech: Speech normal.        Behavior: Behavior normal. Behavior is cooperative.        Thought Content: Thought content normal.        Cognition and Memory: Cognition and memory normal.        Judgment: Judgment normal.     Results for orders placed or performed in visit on 07/06/18  TSH  Result Value Ref Range   TSH 3.140 0.450 - 4.500 uIU/mL  CMP14+EGFR  Result Value Ref Range   Glucose 77 65 - 99 mg/dL   BUN 7 (L) 8 - 27 mg/dL   Creatinine, Ser 0.67 0.57 - 1.00 mg/dL   GFR calc non Af Amer 87 >59 mL/min/1.73   GFR calc Af Amer 101 >59 mL/min/1.73   BUN/Creatinine Ratio 10 (L) 12 - 28   Sodium 130 (L) 134 - 144 mmol/L   Potassium 4.7 3.5 - 5.2 mmol/L   Chloride 88 (L) 96 - 106 mmol/L   CO2 24 20 - 29 mmol/L   Calcium 9.4 8.7 - 10.3 mg/dL   Total Protein 6.7 6.0 - 8.5 g/dL   Albumin 4.9 (H) 3.7 - 4.7 g/dL   Globulin, Total 1.8 1.5 - 4.5 g/dL   Albumin/Globulin Ratio 2.7 (H) 1.2 - 2.2   Bilirubin Total 0.5 0.0 - 1.2 mg/dL   Alkaline Phosphatase 70 39 - 117 IU/L   AST 22 0 - 40 IU/L   ALT 17 0 - 32 IU/L  Zinc  Result Value Ref Range   Zinc 90 56 - 134  ug/dL  Ferritin  Result Value Ref Range   Ferritin 36 15 - 150 ng/mL  Iron and TIBC([facility])  Result Value Ref Range   Total Iron Binding Capacity 386 250 - 450 ug/dL   UIBC 283 118 - 369 ug/dL   Iron 103 27 - 139 ug/dL   Iron Saturation 27 15 - 55 %       Pertinent labs & imaging results that were available during my care of the patient were reviewed by me and considered in my medical decision making.  Assessment & Plan:  Mary [name] was seen today for 4 week recheck.  Diagnoses and all orders for this visit:  Hyponatremia Has increased sodium in diet and cut Lexapro back to 2.5 mg daily. Labs pending.  -     CMP14+EGFR  Anxiety Doing well on Lexapro 2.5 mg, will continue. If sodium continues to drop, will trial other SSRI therapy.  -     escitalopram (LEXAPRO) 5 MG tablet; Take 0.5 tablets (2.5 mg total) by mouth daily.     Continue all other maintenance medications.  Follow up plan: Return in about 3 months (around 11/07/2018), or if symptoms worsen or fail to improve.   The above assessment and management plan was discussed with the patient. The patient verbalized understanding of and has agreed to the management plan. Patient is aware to call the clinic if symptoms persist or worsen. Patient is aware when to return to the clinic for a follow-up visit. Patient educated on when  it is appropriate to go to the emergency department.   Monia Pouch, FNP-C [facility] [phone]

## 2018-08-09 LAB — CMP14+EGFR
ALT: 13 IU/L (ref 0–32)
AST: 17 IU/L (ref 0–40)
Albumin/Globulin Ratio: 2.8 — ABNORMAL HIGH (ref 1.2–2.2)
Albumin: 4.7 g/dL (ref 3.7–4.7)
Alkaline Phosphatase: 66 IU/L (ref 39–117)
BUN/Creatinine Ratio: 19 (ref 12–28)
BUN: 9 mg/dL (ref 8–27)
Bilirubin Total: 0.3 mg/dL (ref 0.0–1.2)
CO2: 26 mmol/L (ref 20–29)
Calcium: 9 mg/dL (ref 8.7–10.3)
Chloride: 89 mmol/L — ABNORMAL LOW (ref 96–106)
Creatinine, Ser: 0.47 mg/dL — ABNORMAL LOW (ref 0.57–1.00)
GFR calc Af Amer: 113 mL/min/{1.73_m2} (ref >59)
GFR calc non Af Amer: 98 mL/min/{1.73_m2} (ref >59)
Globulin, Total: 1.7 g/dL (ref 1.5–4.5)
Glucose: 90 mg/dL (ref 65–99)
Potassium: 4.1 mmol/L (ref 3.5–5.2)
Sodium: 130 mmol/L — ABNORMAL LOW (ref 134–144)
Total Protein: 6.4 g/dL (ref 6.0–8.5)

## 2018-09-08 ENCOUNTER — Other Ambulatory Visit: Payer: Self-pay

## 2018-09-08 ENCOUNTER — Other Ambulatory Visit: Payer: Medicare Other

## 2018-09-08 DIAGNOSIS — E871 Hypo-osmolality and hyponatremia: Secondary | ICD-10-CM | POA: Diagnosis not present

## 2018-09-09 LAB — CMP14+EGFR
ALT: 13 IU/L (ref 0–32)
AST: 21 IU/L (ref 0–40)
Albumin/Globulin Ratio: 2.4 — ABNORMAL HIGH (ref 1.2–2.2)
Albumin: 4.4 g/dL (ref 3.7–4.7)
Alkaline Phosphatase: 65 IU/L (ref 39–117)
BUN/Creatinine Ratio: 14 (ref 12–28)
BUN: 8 mg/dL (ref 8–27)
Bilirubin Total: 0.3 mg/dL (ref 0.0–1.2)
CO2: 26 mmol/L (ref 20–29)
Calcium: 9 mg/dL (ref 8.7–10.3)
Chloride: 89 mmol/L — ABNORMAL LOW (ref 96–106)
Creatinine, Ser: 0.57 mg/dL (ref 0.57–1.00)
GFR calc Af Amer: 106 mL/min/{1.73_m2} (ref >59)
GFR calc non Af Amer: 92 mL/min/{1.73_m2} (ref >59)
Globulin, Total: 1.8 g/dL (ref 1.5–4.5)
Glucose: 85 mg/dL (ref 65–99)
Potassium: 4.2 mmol/L (ref 3.5–5.2)
Sodium: 127 mmol/L — ABNORMAL LOW (ref 134–144)
Total Protein: 6.2 g/dL (ref 6.0–8.5)

## 2018-09-25 ENCOUNTER — Other Ambulatory Visit: Payer: Self-pay

## 2018-09-25 ENCOUNTER — Ambulatory Visit: Payer: Medicare Other | Admitting: Family Medicine

## 2018-09-26 ENCOUNTER — Ambulatory Visit (INDEPENDENT_AMBULATORY_CARE_PROVIDER_SITE_OTHER): Payer: Medicare Other | Admitting: Family Medicine

## 2018-09-26 ENCOUNTER — Other Ambulatory Visit: Payer: Self-pay

## 2018-09-26 ENCOUNTER — Encounter: Payer: Self-pay | Admitting: Family Medicine

## 2018-09-26 VITALS — BP 149/69 | HR 55 | Temp 97.0°F | Ht 63.0 in | Wt 111.6 lb

## 2018-09-26 DIAGNOSIS — E871 Hypo-osmolality and hyponatremia: Secondary | ICD-10-CM | POA: Diagnosis not present

## 2018-09-26 DIAGNOSIS — F419 Anxiety disorder, unspecified: Secondary | ICD-10-CM | POA: Diagnosis not present

## 2018-09-26 NOTE — Progress Notes (Signed)
Subjective:  Patient ID: Mary [name], female    DOB: 1945-12-30, 73 y.o.   MRN: [id number]  Chief Complaint:  needs lab work and depression recheck   HPI: Mary [name] is a 73 y.o. female presenting on 09/26/2018 for needs lab work and depression recheck   1. Hyponatremia   2. Anxiety   Pt presents today for follow up of hyponatremia. Pt states since the last visit she has completely stopped her Lexapro. She states she does cry some but not every day. Pt states she is managing ok without the medications at this time. She states her if symptoms worsen, she is open to trying another medication. Pt denies weakness, confusion, dizziness, fatigue, nausea, vomiting, headache, or swelling. No chest pain, shortness of breath, or cough.    Relevant past medical, surgical, family, and social history reviewed and updated as indicated.  Allergies and medications reviewed and updated.   Past Medical History:  Diagnosis Date  . Anxiety   . Arthritis   . Hypertension   . Irregular heart beat     Past Surgical History:  Procedure Laterality Date  . ABDOMINAL HYSTERECTOMY    . KNEE ARTHROSCOPY    . TONSILLECTOMY      Social History   Socioeconomic History  . Marital status: Married    Spouse name: Not on file  . Number of children: 3  . Years of education: 41  . Highest education level: 12th grade  Occupational History  . Not on file  Social Needs  . Financial resource strain: Not hard at all  . Food insecurity:    Worry: Never true    Inability: Never true  . Transportation needs:    Medical: No    Non-medical: No  Tobacco Use  . Smoking status: Never Smoker  . Smokeless tobacco: Never Used  Substance and Sexual Activity  . Alcohol use: Yes    Alcohol/week: 7.0 standard drinks    Types: 7 Glasses of wine per week  . Drug use: No  . Sexual activity: Not Currently  Lifestyle  . Physical activity:    Days per week: 7 days    Minutes per session: 30 min  .  Stress: Only a little  Relationships  . Social connections:    Talks on phone: More than three times a week    Gets together: More than three times a week    Attends religious service: More than 4 times per year    Active member of club or organization: Yes    Attends meetings of clubs or organizations: More than 4 times per year    Relationship status: Widowed  . Intimate partner violence:    Fear of current or ex partner: No    Emotionally abused: No    Physically abused: No    Forced sexual activity: No  Other Topics Concern  . Not on file  Social History Narrative  . Not on file    Outpatient Encounter Medications as of 09/26/2018  Medication Sig  . amLODipine (NORVASC) 5 MG tablet Take 1 tablet (5 mg total) by mouth daily.  . Cholecalciferol (VITAMIN D3) 125 MCG (5000 UT) CAPS Take 5,000 Units by mouth.  . flecainide (TAMBOCOR) 100 MG tablet Take 100 mg by mouth 2 (two) times daily.   . irbesartan (AVAPRO) 300 MG tablet Take 1 tablet (300 mg total) by mouth daily.  . metoprolol succinate (TOPROL-XL) 50 MG 24 hr tablet Take 50 mg by mouth daily.   [name]  Multiple Vitamins-Minerals (MULTIVITAMIN WITH MINERALS) tablet Take 1 tablet by mouth daily.  . pravastatin (PRAVACHOL) 20 MG tablet Take 1 tablet (20 mg total) by mouth daily.  [name] escitalopram (LEXAPRO) 5 MG tablet Take 0.5 tablets (2.5 mg total) by mouth daily. (Patient not taking: Reported on 09/26/2018)   No facility-administered encounter medications on file as of 09/26/2018.     Allergies  Allergen Reactions  . Fosamax [Alendronate Sodium]     Dizziness, elevated blood pressure  . Sertraline Nausea Only    Insomnia, did not feel well  . Sulfa Antibiotics Nausea Only    Review of Systems  Constitutional: Negative for chills, diaphoresis, fatigue and fever.  Eyes: Negative for photophobia and visual disturbance.  Respiratory: Negative for cough, shortness of breath and wheezing.   Cardiovascular: Negative for chest pain,  palpitations and leg swelling.  Genitourinary: Negative for decreased urine volume and difficulty urinating.  Musculoskeletal: Negative for arthralgias and myalgias.  Neurological: Negative for dizziness, tremors, seizures, syncope, facial asymmetry, speech difficulty, weakness, light-headedness, numbness and headaches.  Hematological: Does not bruise/bleed easily.  Psychiatric/Behavioral: Positive for dysphoric mood. Negative for agitation, behavioral problems, confusion, decreased concentration, hallucinations, self-injury, sleep disturbance and suicidal ideas. The patient is nervous/anxious. The patient is not hyperactive.   All other systems reviewed and are negative.       Objective:  BP (!) 149/69   Pulse (!) 55   Temp (!) 97 F (36.1 C) (Oral)   Ht '5\' 3"'  (1.6 m)   Wt 111 lb 9.6 oz (50.6 kg)   BMI 19.77 kg/m    Wt Readings from Last 3 Encounters:  09/26/18 111 lb 9.6 oz (50.6 kg)  08/08/18 112 lb (50.8 kg)  07/06/18 112 lb (50.8 kg)    Physical Exam Vitals signs and nursing note reviewed.  Constitutional:      General: She is not in acute distress.    Appearance: Normal appearance. She is well-developed, well-groomed and normal weight. She is not ill-appearing, toxic-appearing or diaphoretic.  HENT:     Head: Normocephalic and atraumatic.     Mouth/Throat:     Mouth: Mucous membranes are moist.     Pharynx: Oropharynx is clear.  Eyes:     Extraocular Movements: Extraocular movements intact.     Conjunctiva/sclera: Conjunctivae normal.     Pupils: Pupils are equal, round, and reactive to light.  Neck:     Musculoskeletal: Normal range of motion and neck supple.     Vascular: No carotid bruit.  Cardiovascular:     Rate and Rhythm: Normal rate and regular rhythm.     Heart sounds: Normal heart sounds. No murmur. No friction rub. No gallop.   Pulmonary:     Effort: Pulmonary effort is normal. No respiratory distress.     Breath sounds: Normal breath sounds.  Skin:     General: Skin is warm and dry.     Capillary Refill: Capillary refill takes less than 2 seconds.     Coloration: Skin is not pale.     Findings: No rash.  Neurological:     General: No focal deficit present.     Mental Status: She is alert and oriented to person, place, and time.  Psychiatric:        Mood and Affect: Mood normal.        Behavior: Behavior normal. Behavior is cooperative.        Thought Content: Thought content normal.        Judgment: Judgment  normal.     Results for orders placed or performed in visit on 09/08/18  CMP14+EGFR  Result Value Ref Range   Glucose 85 65 - 99 mg/dL   BUN 8 8 - 27 mg/dL   Creatinine, Ser 0.57 0.57 - 1.00 mg/dL   GFR calc non Af Amer 92 >59 mL/min/1.73   GFR calc Af Amer 106 >59 mL/min/1.73   BUN/Creatinine Ratio 14 12 - 28   Sodium 127 (L) 134 - 144 mmol/L   Potassium 4.2 3.5 - 5.2 mmol/L   Chloride 89 (L) 96 - 106 mmol/L   CO2 26 20 - 29 mmol/L   Calcium 9.0 8.7 - 10.3 mg/dL   Total Protein 6.2 6.0 - 8.5 g/dL   Albumin 4.4 3.7 - 4.7 g/dL   Globulin, Total 1.8 1.5 - 4.5 g/dL   Albumin/Globulin Ratio 2.4 (H) 1.2 - 2.2   Bilirubin Total 0.3 0.0 - 1.2 mg/dL   Alkaline Phosphatase 65 39 - 117 IU/L   AST 21 0 - 40 IU/L   ALT 13 0 - 32 IU/L       Pertinent labs & imaging results that were available during my care of the patient were reviewed by me and considered in my medical decision making.  Assessment & Plan:  Zahli was seen today for needs lab work and depression recheck.  Diagnoses and all orders for this visit:  Hyponatremia Has stopped Lexapro. Will recheck labs today.  -     BMP8+EGFR  Anxiety Doing well without Lexapro. Stress management discussed. Report any new or worsening symptoms. Follow up in 4 weeks.     Continue all other maintenance medications.  Follow up plan: Return in about 4 weeks (around 10/24/2018), or if symptoms worsen or fail to improve, for anxiety, low sodium.    The above assessment  and management plan was discussed with the patient. The patient verbalized understanding of and has agreed to the management plan. Patient is aware to call the clinic if symptoms persist or worsen. Patient is aware when to return to the clinic for a follow-up visit. Patient educated on when it is appropriate to go to the emergency department.   Monia Pouch, FNP-C [facility] [phone]

## 2018-09-27 LAB — BMP8+EGFR
BUN/Creatinine Ratio: 15 (ref 12–28)
BUN: 9 mg/dL (ref 8–27)
CO2: 25 mmol/L (ref 20–29)
Calcium: 9.5 mg/dL (ref 8.7–10.3)
Chloride: 93 mmol/L — ABNORMAL LOW (ref 96–106)
Creatinine, Ser: 0.62 mg/dL (ref 0.57–1.00)
GFR calc Af Amer: 103 mL/min/{1.73_m2} (ref >59)
GFR calc non Af Amer: 90 mL/min/{1.73_m2} (ref >59)
Glucose: 78 mg/dL (ref 65–99)
Potassium: 4.4 mmol/L (ref 3.5–5.2)
Sodium: 135 mmol/L (ref 134–144)

## 2018-10-23 ENCOUNTER — Other Ambulatory Visit: Payer: Self-pay

## 2018-10-24 ENCOUNTER — Ambulatory Visit: Payer: Medicare Other | Admitting: Family Medicine

## 2018-10-30 ENCOUNTER — Other Ambulatory Visit: Payer: Self-pay

## 2018-10-30 DIAGNOSIS — S60551A Superficial foreign body of right hand, initial encounter: Secondary | ICD-10-CM | POA: Diagnosis not present

## 2018-10-30 DIAGNOSIS — L570 Actinic keratosis: Secondary | ICD-10-CM | POA: Diagnosis not present

## 2018-10-30 DIAGNOSIS — L03019 Cellulitis of unspecified finger: Secondary | ICD-10-CM | POA: Diagnosis not present

## 2018-10-31 ENCOUNTER — Ambulatory Visit (INDEPENDENT_AMBULATORY_CARE_PROVIDER_SITE_OTHER): Payer: Medicare Other | Admitting: Family Medicine

## 2018-10-31 ENCOUNTER — Encounter: Payer: Self-pay | Admitting: Family Medicine

## 2018-10-31 VITALS — BP 135/65 | HR 55 | Temp 98.1°F | Ht 63.0 in | Wt 110.8 lb

## 2018-10-31 DIAGNOSIS — F419 Anxiety disorder, unspecified: Secondary | ICD-10-CM

## 2018-10-31 DIAGNOSIS — E871 Hypo-osmolality and hyponatremia: Secondary | ICD-10-CM | POA: Diagnosis not present

## 2018-10-31 MED ORDER — ESCITALOPRAM OXALATE 5 MG PO TABS: 2.5000 mg | ORAL_TABLET | Freq: Every day | ORAL | 1 refills | Status: DC

## 2018-10-31 NOTE — Patient Instructions (Signed)
If your symptoms worsen or you have thoughts of suicide/homicide, PLEASE SEEK IMMEDIATE MEDICAL ATTENTION.  You may always call the National Suicide Hotline.  This is available 24 hours a day, 7 days a week.  Their number is: 1-800-273-8255  Taking the medicine as directed and not missing any doses is one of the best things you can do to treat your depression.  Here are some things to keep in mind:  1) Side effects (stomach upset, some increased anxiety) may happen before you notice a benefit.  These side effects typically go away over time. 2) Changes to your dose of medicine or a change in medication all together is sometimes necessary 3) Most people need to be on medication at least 12 months 4) Many people will notice an improvement within two weeks but the full effect of the medication can take up to 4-6 weeks 5) Stopping the medication when you start feeling better often results in a return of symptoms 6) Never discontinue your medication without contacting a health care professional first.  Some medications require gradual discontinuation/ taper and can make you sick if you stop them abruptly.  If your symptoms worsen or you have thoughts of suicide/homicide, PLEASE SEEK IMMEDIATE MEDICAL ATTENTION.  You may always call:  National Suicide Hotline: 800-273-8255 [place] Crisis Line: 336-832-9700 Crisis Recovery in [place]: 800-939-5911   These are available 24 hours a day, 7 days a week.   

## 2018-10-31 NOTE — Progress Notes (Signed)
Subjective:  Patient ID: Mary [name], female    DOB: Aug 27, 1945, 73 y.o.   MRN: [id number]  Chief Complaint:  Anxiety   HPI: Mary [name] is a 73 y.o. female presenting on 10/31/2018 for Anxiety  Pt following up today for anxiety and hyponatremia. Pt was taking Lexapro and this cause sodium to drop. She has weaned off of Lexapro and sodium returned to normal. Pt states she has increased anxiety and feelings of sadness since stopping the Lexapro. Pt states she does not have any other concerns.   Depression screen [facility] 2/9 10/31/2018 09/26/2018 08/08/2018 07/06/2018 05/25/2018  Decreased Interest 0 0 0 0 0  Down, Depressed, Hopeless 0 0 0 0 0  PHQ - 2 Score 0 0 0 0 0  Altered sleeping - - 0 - -  Tired, decreased energy - - 0 - -  Change in appetite - - 0 - -  Feeling bad or failure about yourself  - - 0 - -  Trouble concentrating - - 0 - -  Moving slowly or fidgety/restless - - 0 - -  Suicidal thoughts - - 0 - -  PHQ-9 Score - - 0 - -   GAD 7 : Generalized Anxiety Score 10/31/2018 08/08/2018 09/28/2016  Nervous, Anxious, on Edge '1 1 1  ' Control/stop worrying '1 1 1  ' Worry too much - different things '1 1 1  ' Trouble relaxing 0 0 1  Restless 0 0 1  Easily annoyed or irritable 0 0 0  Afraid - awful might happen 0 0 0  Total GAD 7 Score '3 3 5  ' Anxiety Difficulty - Not difficult at all Somewhat difficult      Relevant past medical, surgical, family, and social history reviewed and updated as indicated.  Allergies and medications reviewed and updated.   Past Medical History:  Diagnosis Date  . Anxiety   . Arthritis   . Hypertension   . Irregular heart beat     Past Surgical History:  Procedure Laterality Date  . ABDOMINAL HYSTERECTOMY    . KNEE ARTHROSCOPY    . TONSILLECTOMY      Social History   Socioeconomic History  . Marital status: Married    Spouse name: Not on file  . Number of children: 3  . Years of education: 75  . Highest education level: 12th grade   Occupational History  . Not on file  Social Needs  . Financial resource strain: Not hard at all  . Food insecurity    Worry: Never true    Inability: Never true  . Transportation needs    Medical: No    Non-medical: No  Tobacco Use  . Smoking status: Never Smoker  . Smokeless tobacco: Never Used  Substance and Sexual Activity  . Alcohol use: Yes    Alcohol/week: 7.0 standard drinks    Types: 7 Glasses of wine per week  . Drug use: No  . Sexual activity: Not Currently  Lifestyle  . Physical activity    Days per week: 7 days    Minutes per session: 30 min  . Stress: Only a little  Relationships  . Social connections    Talks on phone: More than three times a week    Gets together: More than three times a week    Attends religious service: More than 4 times per year    Active member of club or organization: Yes    Attends meetings of clubs or organizations: More than 4  times per year    Relationship status: Widowed  . Intimate partner violence    Fear of current or ex partner: No    Emotionally abused: No    Physically abused: No    Forced sexual activity: No  Other Topics Concern  . Not on file  Social History Narrative  . Not on file    Outpatient Encounter Medications as of 10/31/2018  Medication Sig  . amLODipine (NORVASC) 5 MG tablet Take 1 tablet (5 mg total) by mouth daily.  . Cholecalciferol (VITAMIN D3) 125 MCG (5000 UT) CAPS Take 5,000 Units by mouth.  . flecainide (TAMBOCOR) 100 MG tablet Take 100 mg by mouth 2 (two) times daily.   . irbesartan (AVAPRO) 300 MG tablet Take 1 tablet (300 mg total) by mouth daily.  . metoprolol succinate (TOPROL-XL) 50 MG 24 hr tablet Take 50 mg by mouth daily.   . Multiple Vitamins-Minerals (MULTIVITAMIN WITH MINERALS) tablet Take 1 tablet by mouth daily.  . pravastatin (PRAVACHOL) 20 MG tablet Take 1 tablet (20 mg total) by mouth daily.  [name] escitalopram (LEXAPRO) 5 MG tablet Take 0.5 tablets (2.5 mg total) by mouth daily.  .  [DISCONTINUED] escitalopram (LEXAPRO) 5 MG tablet Take 0.5 tablets (2.5 mg total) by mouth daily. (Patient not taking: Reported on 09/26/2018)   No facility-administered encounter medications on file as of 10/31/2018.     Allergies  Allergen Reactions  . Fosamax [Alendronate Sodium]     Dizziness, elevated blood pressure  . Sertraline Nausea Only    Insomnia, did not feel well  . Sulfa Antibiotics Nausea Only    Review of Systems  Constitutional: Negative for chills, fatigue and fever.  Eyes: Negative for photophobia and visual disturbance.  Respiratory: Negative for cough, chest tightness and shortness of breath.   Cardiovascular: Positive for leg swelling. Negative for chest pain and palpitations.  Gastrointestinal: Negative for abdominal pain, constipation, diarrhea, nausea and vomiting.  Endocrine: Negative for polyuria.  Genitourinary: Negative for decreased urine volume and difficulty urinating.  Musculoskeletal: Negative for arthralgias and myalgias.  Neurological: Negative for dizziness, tremors, seizures, syncope, facial asymmetry, speech difficulty, weakness, light-headedness, numbness and headaches.  Psychiatric/Behavioral: Positive for decreased concentration and dysphoric mood. Negative for agitation, behavioral problems, confusion, hallucinations, self-injury, sleep disturbance and suicidal ideas. The patient is nervous/anxious. The patient is not hyperactive.   All other systems reviewed and are negative.       Objective:  BP 135/65   Pulse (!) 55   Temp 98.1 F (36.7 C) (Oral)   Ht '5\' 3"'  (1.6 m)   Wt 110 lb 12.8 oz (50.3 kg)   BMI 19.63 kg/m    Wt Readings from Last 3 Encounters:  10/31/18 110 lb 12.8 oz (50.3 kg)  09/26/18 111 lb 9.6 oz (50.6 kg)  08/08/18 112 lb (50.8 kg)    Physical Exam Vitals signs and nursing note reviewed.  Constitutional:      General: She is not in acute distress.    Appearance: Normal appearance. She is well-developed and  well-groomed. She is not ill-appearing, toxic-appearing or diaphoretic.  HENT:     Head: Normocephalic and atraumatic.     Jaw: There is normal jaw occlusion.     Right Ear: Hearing normal.     Left Ear: Hearing normal.     Nose: Nose normal.     Mouth/Throat:     Lips: Pink.     Mouth: Mucous membranes are moist.     Pharynx: Oropharynx is  clear. Uvula midline.  Eyes:     General: Lids are normal.     Extraocular Movements: Extraocular movements intact.     Conjunctiva/sclera: Conjunctivae normal.     Pupils: Pupils are equal, round, and reactive to light.  Neck:     Musculoskeletal: Normal range of motion and neck supple.     Thyroid: No thyroid mass, thyromegaly or thyroid tenderness.     Vascular: No carotid bruit or JVD.     Trachea: Trachea and phonation normal.  Cardiovascular:     Rate and Rhythm: Normal rate and regular rhythm.     Chest Wall: PMI is not displaced.     Pulses: Normal pulses.     Heart sounds: Normal heart sounds. No murmur. No friction rub. No gallop.   Pulmonary:     Effort: Pulmonary effort is normal. No respiratory distress.     Breath sounds: Normal breath sounds. No wheezing.  Abdominal:     General: Bowel sounds are normal. There is no distension or abdominal bruit.     Palpations: Abdomen is soft. There is no hepatomegaly or splenomegaly.     Tenderness: There is no abdominal tenderness. There is no right CVA tenderness or left CVA tenderness.     Hernia: No hernia is present.  Musculoskeletal: Normal range of motion.     Right lower leg: 1+ Edema present.     Left lower leg: 1+ Edema present.  Lymphadenopathy:     Cervical: No cervical adenopathy.  Skin:    General: Skin is warm and dry.     Capillary Refill: Capillary refill takes less than 2 seconds.     Coloration: Skin is not cyanotic, jaundiced or pale.     Findings: No rash.  Neurological:     General: No focal deficit present.     Mental Status: She is alert and oriented to  person, place, and time.     Cranial Nerves: Cranial nerves are intact.     Sensory: Sensation is intact.     Motor: Motor function is intact.     Coordination: Coordination is intact.     Gait: Gait is intact.     Deep Tendon Reflexes: Reflexes are normal and symmetric.  Psychiatric:        Attention and Perception: Attention and perception normal.        Mood and Affect: Mood and affect normal.        Speech: Speech normal.        Behavior: Behavior normal. Behavior is cooperative.        Thought Content: Thought content normal.        Cognition and Memory: Cognition and memory normal.        Judgment: Judgment normal.     Results for orders placed or performed in visit on 09/26/18  [facility]  Result Value Ref Range   Glucose 78 65 - 99 mg/dL   BUN 9 8 - 27 mg/dL   Creatinine, Ser 0.62 0.57 - 1.00 mg/dL   GFR calc non Af Amer 90 >59 mL/min/1.73   GFR calc Af Amer 103 >59 mL/min/1.73   BUN/Creatinine Ratio 15 12 - 28   Sodium 135 134 - 144 mmol/L   Potassium 4.4 3.5 - 5.2 mmol/L   Chloride 93 (L) 96 - 106 mmol/L   CO2 25 20 - 29 mmol/L   Calcium 9.5 8.7 - 10.3 mg/dL       Pertinent labs & imaging results that were available  during my care of the patient were reviewed by me and considered in my medical decision making.  Assessment & Plan:  Samanvi was seen today for anxiety.  Diagnoses and all orders for this visit:  Anxiety Feels anxiety is increasing since stopping the Lexapro. Will recheck sodium today and osmolality today. If normal, will restart Lexapro and recheck levels in 6 weeks. Pt aware she will be notified when labs result and if it is ok to restart medications.  -     escitalopram (LEXAPRO) 5 MG tablet; Take 0.5 tablets (2.5 mg total) by mouth daily.  Hyponatremia Previous sodium 135. Asymptomatic. Will recheck today along with osmolality.  -     BMP8+EGFR -     Osmolality     Continue all other maintenance medications.  Follow up plan: Return in  about 6 weeks (around 12/12/2018), or if symptoms worsen or fail to improve, for Na.  Educational handout given for depression  The above assessment and management plan was discussed with the patient. The patient verbalized understanding of and has agreed to the management plan. Patient is aware to call the clinic if symptoms persist or worsen. Patient is aware when to return to the clinic for a follow-up visit. Patient educated on when it is appropriate to go to the emergency department.   Monia Pouch, FNP-C [facility] [phone]

## 2018-11-01 ENCOUNTER — Other Ambulatory Visit: Payer: Medicare Other

## 2018-11-01 ENCOUNTER — Other Ambulatory Visit: Payer: Self-pay

## 2018-11-01 DIAGNOSIS — E871 Hypo-osmolality and hyponatremia: Secondary | ICD-10-CM | POA: Diagnosis not present

## 2018-11-01 LAB — BMP8+EGFR
BUN/Creatinine Ratio: 16 (ref 12–28)
BUN: 9 mg/dL (ref 8–27)
CO2: 23 mmol/L (ref 20–29)
Calcium: 8.9 mg/dL (ref 8.7–10.3)
Chloride: 92 mmol/L — ABNORMAL LOW (ref 96–106)
Creatinine, Ser: 0.58 mg/dL (ref 0.57–1.00)
GFR calc Af Amer: 106 mL/min/{1.73_m2} (ref >59)
GFR calc non Af Amer: 92 mL/min/{1.73_m2} (ref >59)
Glucose: 92 mg/dL (ref 65–99)
Potassium: 4.3 mmol/L (ref 3.5–5.2)
Sodium: 129 mmol/L — ABNORMAL LOW (ref 134–144)

## 2018-11-01 LAB — OSMOLALITY

## 2018-11-02 ENCOUNTER — Telehealth: Payer: Self-pay | Admitting: Family Medicine

## 2018-11-02 NOTE — Telephone Encounter (Signed)
It was a mix up with lab. They have it ordered. It should result soon.

## 2018-11-02 NOTE — Telephone Encounter (Signed)
Patient aware.

## 2018-11-02 NOTE — Telephone Encounter (Signed)
Was osmolarity labs canceled for any reason?

## 2018-11-02 NOTE — Telephone Encounter (Signed)
I am not sure, saw this today. I need it completed. I will ask lab why it was cancelled.

## 2018-11-07 ENCOUNTER — Other Ambulatory Visit: Payer: Medicare Other

## 2018-11-07 ENCOUNTER — Telehealth: Payer: Self-pay | Admitting: Family Medicine

## 2018-11-07 ENCOUNTER — Other Ambulatory Visit: Payer: Self-pay

## 2018-11-07 DIAGNOSIS — E871 Hypo-osmolality and hyponatremia: Secondary | ICD-10-CM

## 2018-11-07 LAB — SPECIMEN STATUS REPORT

## 2018-11-07 LAB — OSMOLALITY: Osmolality Meas: 210 mOsmol/kg — CL (ref 280–301)

## 2018-11-07 NOTE — Telephone Encounter (Signed)
Patient aware.

## 2018-11-07 NOTE — Addendum Note (Signed)
Addended by: Baruch Gouty on: 11/07/2018 02:44 PM   Modules accepted: Orders

## 2018-11-07 NOTE — Telephone Encounter (Signed)
The serum osmolality has not resulted. I will let her know as soon as it does.

## 2018-11-09 DIAGNOSIS — I10 Essential (primary) hypertension: Secondary | ICD-10-CM | POA: Diagnosis not present

## 2018-11-09 DIAGNOSIS — E871 Hypo-osmolality and hyponatremia: Secondary | ICD-10-CM | POA: Diagnosis not present

## 2018-11-09 DIAGNOSIS — I480 Paroxysmal atrial fibrillation: Secondary | ICD-10-CM | POA: Diagnosis not present

## 2018-11-09 LAB — SODIUM, URINE, RANDOM: Sodium, Ur: 40 mmol/L

## 2018-11-09 LAB — OSMOLALITY, URINE: Osmolality, Ur: 189 mOsmol/kg

## 2018-11-24 ENCOUNTER — Ambulatory Visit: Payer: Medicare Other | Admitting: Family Medicine

## 2018-11-27 ENCOUNTER — Ambulatory Visit: Payer: Medicare Other | Admitting: Family Medicine

## 2018-11-28 ENCOUNTER — Ambulatory Visit: Payer: Medicare Other | Admitting: Family Medicine

## 2018-12-05 ENCOUNTER — Other Ambulatory Visit: Payer: Self-pay | Admitting: Family Medicine

## 2018-12-05 ENCOUNTER — Other Ambulatory Visit: Payer: Self-pay | Admitting: *Deleted

## 2018-12-05 DIAGNOSIS — I10 Essential (primary) hypertension: Secondary | ICD-10-CM

## 2018-12-05 DIAGNOSIS — E785 Hyperlipidemia, unspecified: Secondary | ICD-10-CM

## 2018-12-05 MED ORDER — IRBESARTAN 300 MG PO TABS: 300.0000 mg | ORAL_TABLET | Freq: Every day | ORAL | 0 refills | Status: DC

## 2018-12-05 MED ORDER — PRAVASTATIN SODIUM 20 MG PO TABS: 20.0000 mg | ORAL_TABLET | Freq: Every day | ORAL | 0 refills | Status: DC

## 2018-12-11 ENCOUNTER — Other Ambulatory Visit: Payer: Self-pay

## 2018-12-13 ENCOUNTER — Other Ambulatory Visit: Payer: Self-pay

## 2018-12-13 ENCOUNTER — Encounter: Payer: Self-pay | Admitting: Family Medicine

## 2018-12-13 ENCOUNTER — Ambulatory Visit (INDEPENDENT_AMBULATORY_CARE_PROVIDER_SITE_OTHER): Payer: Medicare Other | Admitting: Family Medicine

## 2018-12-13 VITALS — BP 124/68 | HR 54 | Temp 98.2°F | Ht 63.0 in | Wt 110.0 lb

## 2018-12-13 DIAGNOSIS — Z1239 Encounter for other screening for malignant neoplasm of breast: Secondary | ICD-10-CM

## 2018-12-13 DIAGNOSIS — F419 Anxiety disorder, unspecified: Secondary | ICD-10-CM

## 2018-12-13 DIAGNOSIS — E871 Hypo-osmolality and hyponatremia: Secondary | ICD-10-CM | POA: Diagnosis not present

## 2018-12-13 MED ORDER — CITALOPRAM HYDROBROMIDE 10 MG PO TABS: 10.0000 mg | ORAL_TABLET | Freq: Every day | ORAL | 3 refills | Status: DC

## 2018-12-13 NOTE — Progress Notes (Signed)
Subjective:  Patient ID: Mary [name], female    DOB: [birth year], 73 y.o.   MRN: [id number]  Patient Care Team: Baruch Gouty, FNP as PCP - General (Family Medicine) Ellis Parents, MD as Attending Physician (Internal Medicine)   Chief Complaint:  Medical Management of Chronic Issues (6 week ) and Anxiety   HPI: Mary [name] is a 73 y.o. female presenting on 12/13/2018 for Medical Management of Chronic Issues (6 week ) and Anxiety   Pt presents today for reevaluation of anxiety and hyponatremia. Pt was taking Lexapro for her anxiety which caused a drop in her sodium level. Pt states she has had some days where she feels anxious and tearful. She states she felt so much better on her Lexapro. States she has been managing without. States she has been increasing her sodium intake. She denies headache, confusion, weakness, dizziness, syncope, nausea, vomiting, chest pain, palpitations, or shortness of breath.   GAD 7 : Generalized Anxiety Score 12/13/2018 10/31/2018 08/08/2018 09/28/2016  Nervous, Anxious, on Edge _0 Control/stop worrying _1 Worry too much - different things _2 Trouble relaxing 0 0 0 1  Restless 0 0 0 1  Easily annoyed or irritable 0 0 0 0  Afraid - awful might happen 0 0 0 0  Total GAD 7 Score _3 Anxiety Difficulty - - Not difficult at all Somewhat difficult      Relevant past medical, surgical, family, and social history reviewed and updated as indicated.  Allergies and medications reviewed and updated. Date reviewed: Chart in Epic.   Past Medical History:  Diagnosis Date  . Anxiety   . Arthritis   . Hypertension   . Irregular heart beat     Past Surgical History:  Procedure Laterality Date  . ABDOMINAL HYSTERECTOMY    . KNEE ARTHROSCOPY    . TONSILLECTOMY      Social History   Socioeconomic History  . Marital status: Married    Spouse name: Not on file  . Number of children: 3  . Years of education: 18  . Highest  education level: 12th grade  Occupational History  . Not on file  Social Needs  . Financial resource strain: Not hard at all  . Food insecurity    Worry: Never true    Inability: Never true  . Transportation needs    Medical: No    Non-medical: No  Tobacco Use  . Smoking status: Never Smoker  . Smokeless tobacco: Never Used  Substance and Sexual Activity  . Alcohol use: Yes    Alcohol/week: 7.0 standard drinks    Types: 7 Glasses of wine per week  . Drug use: No  . Sexual activity: Not Currently  Lifestyle  . Physical activity    Days per week: 7 days    Minutes per session: 30 min  . Stress: Only a little  Relationships  . Social connections    Talks on phone: More than three times a week    Gets together: More than three times a week    Attends religious service: More than 4 times per year    Active member of club or organization: Yes    Attends meetings of clubs or organizations: More than 4 times per year    Relationship status: Widowed  . Intimate partner violence    Fear of current or ex partner: No    Emotionally  abused: No    Physically abused: No    Forced sexual activity: No  Other Topics Concern  . Not on file  Social History Narrative  . Not on file    Outpatient Encounter Medications as of 12/13/2018  Medication Sig  . amLODipine (NORVASC) 5 MG tablet TAKE 1 TABLET BY MOUTH  DAILY  . Cholecalciferol (VITAMIN D3) 125 MCG (5000 UT) CAPS Take 5,000 Units by mouth.  . ferrous sulfate 325 (65 FE) MG tablet Take 325 mg by mouth daily with breakfast. 1/2 tab daily  . flecainide (TAMBOCOR) 100 MG tablet Take 100 mg by mouth 2 (two) times daily.   . irbesartan (AVAPRO) 300 MG tablet Take 1 tablet (300 mg total) by mouth daily.  . metoprolol succinate (TOPROL-XL) 50 MG 24 hr tablet Take 50 mg by mouth daily.   . Multiple Vitamins-Minerals (MULTIVITAMIN WITH MINERALS) tablet Take 1 tablet by mouth daily.  . pravastatin (PRAVACHOL) 20 MG tablet Take 1 tablet (20  mg total) by mouth daily.  . citalopram (CELEXA) 10 MG tablet Take 1 tablet (10 mg total) by mouth daily.  . [DISCONTINUED] escitalopram (LEXAPRO) 5 MG tablet Take 0.5 tablets (2.5 mg total) by mouth daily.   No facility-administered encounter medications on file as of 12/13/2018.     Allergies  Allergen Reactions  . Fosamax [Alendronate Sodium]     Dizziness, elevated blood pressure  . Sertraline Nausea Only    Insomnia, did not feel well  . Sulfa Antibiotics Nausea Only    Review of Systems  Constitutional: Negative for activity change, appetite change, chills, diaphoresis, fatigue, fever and unexpected weight change.  Eyes: Negative for photophobia and visual disturbance.  Respiratory: Negative for cough and shortness of breath.   Cardiovascular: Negative for chest pain, palpitations and leg swelling.  Gastrointestinal: Negative for abdominal pain, constipation, diarrhea, nausea and vomiting.  Endocrine: Negative for cold intolerance, heat intolerance, polydipsia, polyphagia and polyuria.  Genitourinary: Negative for decreased urine volume and difficulty urinating.  Musculoskeletal: Negative for arthralgias and myalgias.  Skin: Negative for color change and pallor.  Neurological: Negative for dizziness, tremors, seizures, syncope, facial asymmetry, speech difficulty, weakness, light-headedness, numbness and headaches.  Hematological: Does not bruise/bleed easily.  Psychiatric/Behavioral: Positive for dysphoric mood. Negative for agitation, behavioral problems, confusion, decreased concentration, hallucinations, self-injury, sleep disturbance and suicidal ideas. The patient is nervous/anxious. The patient is not hyperactive.   All other systems reviewed and are negative.       Objective:  BP 124/68   Pulse (!) 54   Temp 98.2 F (36.8 C)   Ht _0  (1.6 m)   Wt 110 lb (49.9 kg)   BMI 19.49 kg/m    Wt Readings from Last 3 Encounters:  12/13/18 110 lb (49.9 kg)  10/31/18  110 lb 12.8 oz (50.3 kg)  09/26/18 111 lb 9.6 oz (50.6 kg)    Physical Exam Vitals signs and nursing note reviewed.  Constitutional:      General: She is not in acute distress.    Appearance: Normal appearance. She is well-developed and well-groomed. She is not ill-appearing, toxic-appearing or diaphoretic.  HENT:     Head: Normocephalic and atraumatic.     Jaw: There is normal jaw occlusion.     Right Ear: Hearing normal.     Left Ear: Hearing normal.     Nose: Nose normal.     Mouth/Throat:     Lips: Pink.     Mouth: Mucous membranes are moist.  Pharynx: Oropharynx is clear. Uvula midline.  Eyes:     General: Lids are normal.     Extraocular Movements: Extraocular movements intact.     Conjunctiva/sclera: Conjunctivae normal.     Pupils: Pupils are equal, round, and reactive to light.  Neck:     Musculoskeletal: Normal range of motion and neck supple.     Thyroid: No thyroid mass, thyromegaly or thyroid tenderness.     Vascular: No carotid bruit or JVD.     Trachea: Trachea and phonation normal.  Cardiovascular:     Rate and Rhythm: Normal rate and regular rhythm.     Chest Wall: PMI is not displaced.     Pulses: Normal pulses.     Heart sounds: Normal heart sounds. No murmur. No friction rub. No gallop.   Pulmonary:     Effort: Pulmonary effort is normal. No respiratory distress.     Breath sounds: Normal breath sounds. No wheezing.  Abdominal:     General: Bowel sounds are normal. There is no distension or abdominal bruit.     Palpations: Abdomen is soft. There is no hepatomegaly or splenomegaly.     Tenderness: There is no abdominal tenderness. There is no right CVA tenderness or left CVA tenderness.     Hernia: No hernia is present.  Musculoskeletal: Normal range of motion.     Right lower leg: No edema.     Left lower leg: No edema.  Lymphadenopathy:     Cervical: No cervical adenopathy.  Skin:    General: Skin is warm and dry.     Capillary Refill:  Capillary refill takes less than 2 seconds.     Coloration: Skin is not cyanotic, jaundiced or pale.     Findings: No rash.  Neurological:     General: No focal deficit present.     Mental Status: She is alert and oriented to person, place, and time.     Cranial Nerves: Cranial nerves are intact.     Sensory: Sensation is intact.     Motor: Motor function is intact.     Coordination: Coordination is intact.     Gait: Gait is intact.     Deep Tendon Reflexes: Reflexes are normal and symmetric.  Psychiatric:        Attention and Perception: Attention and perception normal.        Mood and Affect: Mood and affect normal.        Speech: Speech normal.        Behavior: Behavior normal. Behavior is cooperative.        Thought Content: Thought content normal.        Cognition and Memory: Cognition and memory normal.        Judgment: Judgment normal.     Results for orders placed or performed in visit on 11/07/18  Osmolality, urine  Result Value Ref Range   Osmolality, Ur 189 mOsmol/kg  Sodium, urine, random  Result Value Ref Range   Sodium, Ur 40 Not Estab. mmol/L       Pertinent labs & imaging results that were available during my care of the patient were reviewed by me and considered in my medical decision making.  Assessment & Plan:  Mary [name] was seen today for medical management of chronic issues.  Diagnoses and all orders for this visit:  Anxiety Was doing great on Lexapro but caused hyponatremia. Will trial Celexa. Take as prescribed. Report any new or worsening symptoms. Follow up in 6-8 weeks.  -  citalopram (CELEXA) 10 MG tablet; Take 1 tablet (10 mg total) by mouth daily.  Hyponatremia Asymptomatic. Has increased sodium intake in diet. Will recheck today.  -     CMP14+EGFR  Screening for breast cancer -     MM 3D SCREEN BREAST BILATERAL; Future     Continue all other maintenance medications.  Follow up plan: Return if symptoms worsen or fail to improve.   Continue healthy lifestyle choices, including diet (rich in fruits, vegetables, and lean proteins, and low in salt and simple carbohydrates) and exercise (at least 30 minutes of moderate physical activity daily).   The above assessment and management plan was discussed with the patient. The patient verbalized understanding of and has agreed to the management plan. Patient is aware to call the clinic if symptoms persist or worsen. Patient is aware when to return to the clinic for a follow-up visit. Patient educated on when it is appropriate to go to the emergency department.   Monia Pouch, FNP-C [facility] [phone] 12/13/18

## 2018-12-14 LAB — CMP14+EGFR
ALT: 11 IU/L (ref 0–32)
AST: 20 IU/L (ref 0–40)
Albumin/Globulin Ratio: 2.6 — ABNORMAL HIGH (ref 1.2–2.2)
Albumin: 4.7 g/dL (ref 3.7–4.7)
Alkaline Phosphatase: 60 IU/L (ref 39–117)
BUN/Creatinine Ratio: 13 (ref 12–28)
BUN: 8 mg/dL (ref 8–27)
Bilirubin Total: 0.3 mg/dL (ref 0.0–1.2)
CO2: 24 mmol/L (ref 20–29)
Calcium: 9 mg/dL (ref 8.7–10.3)
Chloride: 94 mmol/L — ABNORMAL LOW (ref 96–106)
Creatinine, Ser: 0.62 mg/dL (ref 0.57–1.00)
GFR calc Af Amer: 103 mL/min/{1.73_m2} (ref >59)
GFR calc non Af Amer: 90 mL/min/{1.73_m2} (ref >59)
Globulin, Total: 1.8 g/dL (ref 1.5–4.5)
Glucose: 81 mg/dL (ref 65–99)
Potassium: 4.3 mmol/L (ref 3.5–5.2)
Sodium: 135 mmol/L (ref 134–144)
Total Protein: 6.5 g/dL (ref 6.0–8.5)

## 2019-01-04 DIAGNOSIS — D229 Melanocytic nevi, unspecified: Secondary | ICD-10-CM | POA: Diagnosis not present

## 2019-01-04 DIAGNOSIS — L570 Actinic keratosis: Secondary | ICD-10-CM | POA: Diagnosis not present

## 2019-01-04 DIAGNOSIS — L659 Nonscarring hair loss, unspecified: Secondary | ICD-10-CM | POA: Diagnosis not present

## 2019-01-18 ENCOUNTER — Telehealth: Payer: Self-pay | Admitting: Family Medicine

## 2019-01-18 NOTE — Telephone Encounter (Signed)
Spoke to patient

## 2019-01-22 ENCOUNTER — Ambulatory Visit (INDEPENDENT_AMBULATORY_CARE_PROVIDER_SITE_OTHER): Payer: Medicare Other | Admitting: *Deleted

## 2019-01-22 DIAGNOSIS — Z0000 Encounter for general adult medical examination without abnormal findings: Secondary | ICD-10-CM | POA: Diagnosis not present

## 2019-01-22 NOTE — Patient Instructions (Signed)
Preventive Care 74 Years and Older, Female Preventive care refers to lifestyle choices and visits with your health care provider that can promote health and wellness. This includes:  A yearly physical exam. This is also called an annual well check.  Regular dental and eye exams.  Immunizations.  Screening for certain conditions.  Healthy lifestyle choices, such as diet and exercise. What can I expect for my preventive care visit? Physical exam Your health care provider will check:  Height and weight. These may be used to calculate body mass index (BMI), which is a measurement that tells if you are at a healthy weight.  Heart rate and blood pressure.  Your skin for abnormal spots. Counseling Your health care provider may ask you questions about:  Alcohol, tobacco, and drug use.  Emotional well-being.  Home and relationship well-being.  Sexual activity.  Eating habits.  History of falls.  Memory and ability to understand (cognition).  Work and work Statistician.  Pregnancy and menstrual history. What immunizations do I need?  Influenza (flu) vaccine  This is recommended every year. Tetanus, diphtheria, and pertussis (Tdap) vaccine  You may need a Td booster every 10 years. Varicella (chickenpox) vaccine  You may need this vaccine if you have not already been vaccinated. Zoster (shingles) vaccine  You may need this after age 33. Pneumococcal conjugate (PCV13) vaccine  One dose is recommended after age 33. Pneumococcal polysaccharide (PPSV23) vaccine  One dose is recommended after age 72. Measles, mumps, and rubella (MMR) vaccine  You may need at least one dose of MMR if you were born in 1957 or later. You may also need a second dose. Meningococcal conjugate (MenACWY) vaccine  You may need this if you have certain conditions. Hepatitis A vaccine  You may need this if you have certain conditions or if you travel or work in places where you may be exposed  to hepatitis A. Hepatitis B vaccine  You may need this if you have certain conditions or if you travel or work in places where you may be exposed to hepatitis B. Haemophilus influenzae type b (Hib) vaccine  You may need this if you have certain conditions. You may receive vaccines as individual doses or as more than one vaccine together in one shot (combination vaccines). Talk with your health care provider about the risks and benefits of combination vaccines. What tests do I need? Blood tests  Lipid and cholesterol levels. These may be checked every 5 years, or more frequently depending on your overall health.  Hepatitis C test.  Hepatitis B test. Screening  Lung cancer screening. You may have this screening every year starting at age 39 if you have a 30-pack-year history of smoking and currently smoke or have quit within the past 15 years.  Colorectal cancer screening. All adults should have this screening starting at age 36 and continuing until age 15. Your health care provider may recommend screening at age 23 if you are at increased risk. You will have tests every 1-10 years, depending on your results and the type of screening test.  Diabetes screening. This is done by checking your blood sugar (glucose) after you have not eaten for a while (fasting). You may have this done every 1-3 years.  Mammogram. This may be done every 1-2 years. Talk with your health care provider about how often you should have regular mammograms.  BRCA-related cancer screening. This may be done if you have a family history of breast, ovarian, tubal, or peritoneal cancers.  Other tests  Sexually transmitted disease (STD) testing.  Bone density scan. This is done to screen for osteoporosis. You may have this done starting at age 55. Follow these instructions at home: Eating and drinking  Eat a diet that includes fresh fruits and vegetables, whole grains, lean protein, and low-fat dairy products. Limit  your intake of foods with high amounts of sugar, saturated fats, and salt.  Take vitamin and mineral supplements as recommended by your health care provider.  Do not drink alcohol if your health care provider tells you not to drink.  If you drink alcohol: ? Limit how much you have to 0-1 drink a day. ? Be aware of how much alcohol is in your drink. In the [place] one drink equals one 12 oz bottle of beer (355 mL), one 5 oz glass of wine (148 mL), or one 1 oz glass of hard liquor (44 mL). Lifestyle  Take daily care of your teeth and gums.  Stay active. Exercise for at least 30 minutes on 5 or more days each week.  Do not use any products that contain nicotine or tobacco, such as cigarettes, e-cigarettes, and chewing tobacco. If you need help quitting, ask your health care provider.  If you are sexually active, practice safe sex. Use a condom or other form of protection in order to prevent STIs (sexually transmitted infections).  Talk with your health care provider about taking a low-dose aspirin or statin. What's next?  Go to your health care provider once a year for a well check visit.  Ask your health care provider how often you should have your eyes and teeth checked.  Stay up to date on all vaccines. This information is not intended to replace advice given to you by your health care provider. Make sure you discuss any questions you have with your health care provider. Document Released: 05/09/2015 Document Revised: 04/06/2018 Document Reviewed: 04/06/2018 Elsevier Patient Education  2020 [employer].

## 2019-01-22 NOTE — Progress Notes (Signed)
MEDICARE ANNUAL WELLNESS VISIT  01/22/2019  Telephone Visit Disclaimer This Medicare AWV was conducted by telephone due to national recommendations for restrictions regarding the COVID-19 Pandemic (e.g. social distancing).  I verified, using two identifiers, that I am speaking with Mary [name] or their authorized healthcare agent. I discussed the limitations, risks, security, and privacy concerns of performing an evaluation and management service by telephone and the potential availability of an in-person appointment in the future. The patient expressed understanding and agreed to proceed.   Subjective:  Mary [name] is a 73 y.o. female patient of Rakes, Connye Burkitt, FNP who had a Medicare Annual Wellness Visit today via telephone. Mary [name] and lives alone with her International aid/development worker. she has 3 children. she reports that she is socially active and does interact with friends/family regularly. she is markedly physically active and enjoys doing crossword puzzles, word search puzzles, reading and studying the Bible.  Patient Care Team: Baruch Gouty, FNP as PCP - General (Family Medicine) Ellis Parents, MD as Attending Physician (Internal Medicine)  Advanced Directives 01/22/2019 01/20/2018  Does Patient Have a Medical Advance Directive? Yes Yes  Type of Paramedic of [place];Living will Living will;Healthcare Power of Attorney  Does patient want to make changes to medical advance directive? No - Patient declined No - Patient declined  Copy of [place] in Chart? No - copy requested No - copy requested    Hospital Utilization Over the Past 12 Months: # of hospitalizations or ER visits: 0 # of surgeries: 0  Review of Systems    Patient reports that her overall health is better compared to last year.  History obtained from chart review  Patient Reported Readings (BP, Pulse, CBG, Weight, etc) none  Pain Assessment Pain : No/denies pain      Current Medications & Allergies (verified) Allergies as of 01/22/2019      Reactions   Fosamax [alendronate Sodium]    Dizziness, elevated blood pressure   Sertraline Nausea Only   Insomnia, did not feel well   Sulfa Antibiotics Nausea Only      Medication List       Accurate as of January 22, 2019  2:38 PM. If you have any questions, ask your nurse or doctor.        amLODipine 5 MG tablet Commonly known as: NORVASC TAKE 1 TABLET BY MOUTH  DAILY   aspirin 325 MG tablet Take 325 mg by mouth daily.   citalopram 10 MG tablet Commonly known as: CELEXA Take 1 tablet (10 mg total) by mouth daily.   ferrous sulfate 325 (65 FE) MG tablet Take 325 mg by mouth daily with breakfast. 1/2 tab daily   flecainide 100 MG tablet Commonly known as: TAMBOCOR Take 100 mg by mouth 2 (two) times daily.   irbesartan 300 MG tablet Commonly known as: AVAPRO Take 1 tablet (300 mg total) by mouth daily.   metoprolol succinate 50 MG 24 hr tablet Commonly known as: TOPROL-XL Take 50 mg by mouth daily.   multivitamin with minerals tablet Take 1 tablet by mouth daily.   pravastatin 20 MG tablet Commonly known as: PRAVACHOL Take 1 tablet (20 mg total) by mouth daily.   Vitamin D3 125 MCG (5000 UT) Caps Take 5,000 Units by mouth.       History (reviewed): Past Medical History:  Diagnosis Date  . Anxiety   . Arthritis   . Hypertension   . Irregular heart beat  Past Surgical History:  Procedure Laterality Date  . ABDOMINAL HYSTERECTOMY    . KNEE ARTHROSCOPY    . TONSILLECTOMY     Family History  Problem Relation Age of Onset  . Arthritis Mother   . Heart disease Mother   . Hypertension Mother   . Hyperlipidemia Mother   . Kidney disease Mother   . Hypertension Brother   . Stroke Maternal Grandmother   . Heart disease Maternal Grandfather   . Arthritis Paternal Grandmother    Social History   Socioeconomic History  . Marital status: Widowed    Spouse name:  Not on file  . Number of children: 3  . Years of education: 53  . Highest education level: 12th grade  Occupational History  . 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 Financial resource strain: Not hard at all  . Food insecurity    Worry: Never true    Inability: Never true  . Transportation needs    Medical: No    Non-medical: No  Tobacco Use  . Smoking status: Never Smoker  . Smokeless tobacco: Never Used  Substance and Sexual Activity  . Alcohol use: Yes    Alcohol/week: 7.0 standard drinks    Types: 7 Glasses of wine per week  . Drug use: No  . Sexual activity: Not Currently  Lifestyle  . Physical activity    Days per week: 7 days    Minutes per session: 50 min  . Stress: Only a little  Relationships  . Social connections    Talks on phone: More than three times a week    Gets together: More than three times a week    Attends religious service: More than 4 times per year    Active member of club or organization: Yes    Attends meetings of clubs or organizations: More than 4 times per year    Relationship status: Widowed  Other Topics Concern  . Not on file  Social History Narrative  . Not on file    Activities of Daily Living In your present state of health, do you have any difficulty performing the following activities: 01/22/2019  Hearing? N  Vision? Y  Comment wears reading glasses-having some blurry vision-does have yearly eye exam  Difficulty concentrating or making decisions? N  Walking or climbing stairs? N  Dressing or bathing? N  Doing errands, shopping? N  Preparing Food and eating ? N  Using the Toilet? N  In the past six months, have you accidently leaked urine? N  Do you have problems with loss of bowel control? N  Managing your Medications? N  Managing your Finances? N  Housekeeping or managing your Housekeeping? N  Some recent data might be hidden    Patient Education/ Literacy How often do you need to have someone help you when you read  instructions, pamphlets, or other written materials from your doctor or pharmacy?: 1 - Never What is the last grade level you completed in school?: 12th grade  Exercise Current Exercise Habits: Home exercise routine, Type of exercise: walking, Time (Minutes): 45, Frequency (Times/Week): 7, Weekly Exercise (Minutes/Week): 315, Intensity: Moderate, Exercise limited by: cardiac condition(s)  Diet Patient reports consuming 3 meals a day and 1 snack(s) a day Patient reports that her primary diet is: Regular Patient reports that she does have regular access to food.   Depression Screen PHQ 2/9 Scores 01/22/2019 12/13/2018 10/31/2018 09/26/2018 08/08/2018 07/06/2018 05/25/2018  PHQ - 2 Score 0 1 0 0 0  0 0  PHQ- 9 Score - - - - 0 - -     Fall Risk Fall Risk  01/22/2019 12/13/2018 10/31/2018 09/26/2018 07/06/2018  Falls in the past year? 1 0 1 0 0  Comment - - - - -  Number falls in past yr: 0 - 1 - -  Injury with Fall? 0 - 0 - -  Risk for fall due to : - - - - -  Follow up Falls prevention discussed - - - -  Comment Get rid of all throw rugs in the house, adequate lighting in the walkways and grab bars in the bathroom. - - - -     Objective:  Mary [name] seemed alert and oriented and she participated appropriately during our telephone visit.  Blood Pressure Weight BMI  BP Readings from Last 3 Encounters:  12/13/18 124/68  10/31/18 135/65  09/26/18 (!) 149/69   Wt Readings from Last 3 Encounters:  12/13/18 110 lb (49.9 kg)  10/31/18 110 lb 12.8 oz (50.3 kg)  09/26/18 111 lb 9.6 oz (50.6 kg)   BMI Readings from Last 1 Encounters:  12/13/18 19.49 kg/m    *Unable to obtain current vital signs, weight, and BMI due to telephone visit type  Hearing/Vision  . Arthella did not seem to have difficulty with hearing/understanding during the telephone conversation . Reports that she has had a formal eye exam by an eye care professional within the past year . Reports that she has not had a formal  hearing evaluation within the past year *Unable to fully assess hearing and vision during telephone visit type  Cognitive Function: 6CIT Screen 01/22/2019  What Year? 0 points  What month? 0 points  What time? 0 points  Count back from 20 0 points  Months in reverse 0 points  Repeat phrase 2 points  Total Score 2   (Normal:0-7, Significant for Dysfunction: >8)  Normal Cognitive Function Screening: Yes   Immunization & Health Maintenance Record Immunization History  Administered Date(s) Administered  . Influenza Split 06/13/2013  . Influenza, High Dose Seasonal PF 06/06/2012  . Influenza, Seasonal, Injecte, Preservative Fre 03/31/2015  . Pneumococcal Conjugate-13 06/13/2013  . Pneumococcal Polysaccharide-23 04/17/2010  . Pneumococcal-Unspecified 04/17/2010  . Td 04/27/2003  . Tdap 06/13/2013    Health Maintenance  Topic Date Due  . MAMMOGRAM  07/20/2018  . INFLUENZA VACCINE  11/25/2018  . COLONOSCOPY  09/29/2026 (Originally 08/29/2016)  . DEXA SCAN  05/20/2019  . TETANUS/TDAP  06/14/2023  . Hepatitis C Screening  Completed  . PNA vac Low Risk Adult  Completed       Assessment  This is a routine wellness examination for Mary [name].  Health Maintenance: Due or Overdue Health Maintenance Due  Topic Date Due  . MAMMOGRAM  07/20/2018  . INFLUENZA VACCINE  11/25/2018    Mary [name] does not need a referral for Community Assistance: Care Management:   no Social Work:    no Prescription Assistance:  no Nutrition/Diabetes Education:  no   Plan:  Personalized Goals Goals Addressed            This Visit's Progress   . DIET - INCREASE WATER INTAKE       Try to drink 6-8 glasses of water daily.      Personalized Health Maintenance & Screening Recommendations  Influenza vaccine Screening mammography Shingles vaccine  Lung Cancer Screening Recommended: no (Low Dose CT Chest recommended if Age 94-80 years, 30 pack-year currently smoking OR have  quit w/in past 15 years) Hepatitis C Screening recommended: no HIV Screening recommended: no  Advanced Directives: Written information was not prepared per patient's request.  Referrals & Orders No orders of the defined types were placed in this encounter.   Follow-up Plan . Follow-up with Baruch Gouty, FNP as planned . Schedule your Screening Mammogram as discussed . Consider Flu and Shingles vaccines at your next visit with your PCP . Bring a copy of your Advanced Directives in for our records   I have personally reviewed and noted the following in the patient's chart:   . Medical and social history . Use of alcohol, tobacco or illicit drugs  . Current medications and supplements . Functional ability and status . Nutritional status . Physical activity . Advanced directives . List of other physicians . Hospitalizations, surgeries, and ER visits in previous 12 months . Vitals . Screenings to include cognitive, depression, and falls . Referrals and appointments  In addition, I have reviewed and discussed with Mary [name] certain preventive protocols, quality metrics, and best practice recommendations. A written personalized care plan for preventive services as well as general preventive health recommendations is available and can be mailed to the patient at her request.      Milas Hock, LPN  [id number]

## 2019-01-30 ENCOUNTER — Other Ambulatory Visit: Payer: Self-pay

## 2019-01-31 ENCOUNTER — Ambulatory Visit (INDEPENDENT_AMBULATORY_CARE_PROVIDER_SITE_OTHER): Payer: Medicare Other | Admitting: Family Medicine

## 2019-01-31 ENCOUNTER — Encounter: Payer: Self-pay | Admitting: Family Medicine

## 2019-01-31 VITALS — BP 135/64 | HR 57 | Temp 96.9°F | Resp 18 | Ht 63.0 in | Wt 112.0 lb

## 2019-01-31 DIAGNOSIS — R5383 Other fatigue: Secondary | ICD-10-CM

## 2019-01-31 DIAGNOSIS — E871 Hypo-osmolality and hyponatremia: Secondary | ICD-10-CM

## 2019-01-31 DIAGNOSIS — L659 Nonscarring hair loss, unspecified: Secondary | ICD-10-CM

## 2019-01-31 DIAGNOSIS — F419 Anxiety disorder, unspecified: Secondary | ICD-10-CM | POA: Diagnosis not present

## 2019-01-31 NOTE — Progress Notes (Signed)
Subjective:  Patient ID: Mary [name], female    DOB: Feb 23, 1946, 72 y.o.   MRN: [id number]  Patient Care Team: Baruch Gouty, FNP as PCP - General (Family Medicine) Ellis Parents, MD as Attending Physician (Internal Medicine)   Chief Complaint:  Medical Management of Chronic Issues (6 wk - anxiety)   HPI: Mary [name] is a 73 y.o. female presenting on 01/31/2019 for Medical Management of Chronic Issues (6 wk - anxiety)   1. Anxiety  Pt reports doing well on the Celexa. States her weeping and crying spells have stopped. States she feels better than she did when not taking any medications. Pt is tolerating the medications well. No noted adverse side effects.  Depression screen [facility] 2/9 01/31/2019 01/22/2019 12/13/2018 10/31/2018 09/26/2018  Decreased Interest 0 0 0 0 0  Down, Depressed, Hopeless 0 0 1 0 0  PHQ - 2 Score 0 0 1 0 0  Altered sleeping 0 - - - -  Tired, decreased energy 0 - - - -  Change in appetite 0 - - - -  Feeling bad or failure about yourself  0 - - - -  Trouble concentrating 0 - - - -  Moving slowly or fidgety/restless 0 - - - -  Suicidal thoughts 0 - - - -  PHQ-9 Score 0 - - - -  Difficult doing work/chores Not difficult at all - - - -   GAD 7 : Generalized Anxiety Score 01/31/2019 12/13/2018 10/31/2018 08/08/2018  Nervous, Anxious, on Edge 0 '1 1 1  ' Control/stop worrying 0 '1 1 1  ' Worry too much - different things 0 '1 1 1  ' Trouble relaxing 0 0 0 0  Restless 0 0 0 0  Easily annoyed or irritable 0 0 0 0  Afraid - awful might happen 0 0 0 0  Total GAD 7 Score 0 '3 3 3  ' Anxiety Difficulty Not difficult at all - - Not difficult at all      2. Hyponatremia  Hyponatremia associated with Lexapro use. Medication changed to Celexa. Will recheck sodium level today. Pt denies dizziness, weakness, confusion, headache, nausea, vomiting, or swelling.    3. Loss of hair   4. Other fatigue  Ongoing fatigue and loss of hair. Followed by dermatology. Dermatologist  would like follow up labs. Has not been able to see dermatology due to COVID-19 pandemic. Will check requested labs today.      Relevant past medical, surgical, family, and social history reviewed and updated as indicated.  Allergies and medications reviewed and updated. Date reviewed: Chart in Epic.   Past Medical History:  Diagnosis Date  . Anxiety   . Arthritis   . Hypertension   . Irregular heart beat     Past Surgical History:  Procedure Laterality Date  . ABDOMINAL HYSTERECTOMY    . KNEE ARTHROSCOPY    . TONSILLECTOMY      Social History   Socioeconomic History  . Marital status: Widowed    Spouse name: Not on file  . Number of children: 3  . Years of education: 45  . Highest education level: 12th grade  Occupational History  . Occupation: retired  Scientific laboratory technician  . Financial resource strain: Not hard at all  . Food insecurity    Worry: Never true    Inability: Never true  . Transportation needs    Medical: No    Non-medical: No  Tobacco Use  . Smoking status: Never Smoker  .  Smokeless tobacco: Never Used  Substance and Sexual Activity  . Alcohol use: Yes    Alcohol/week: 7.0 standard drinks    Types: 7 Glasses of wine per week  . Drug use: No  . Sexual activity: Not Currently  Lifestyle  . Physical activity    Days per week: 7 days    Minutes per session: 50 min  . Stress: Only a little  Relationships  . Social connections    Talks on phone: More than three times a week    Gets together: More than three times a week    Attends religious service: More than 4 times per year    Active member of club or organization: Yes    Attends meetings of clubs or organizations: More than 4 times per year    Relationship status: Widowed  . Intimate partner violence    Fear of current or ex partner: No    Emotionally abused: No    Physically abused: No    Forced sexual activity: No  Other Topics Concern  . Not on file  Social History Narrative  . Not on file     Outpatient Encounter Medications as of 01/31/2019  Medication Sig  . amLODipine (NORVASC) 5 MG tablet TAKE 1 TABLET BY MOUTH  DAILY  . aspirin 325 MG tablet Take 325 mg by mouth daily.  . Cholecalciferol (VITAMIN D3) 125 MCG (5000 UT) CAPS Take 5,000 Units by mouth.  . citalopram (CELEXA) 10 MG tablet Take 1 tablet (10 mg total) by mouth daily.  . ferrous sulfate 325 (65 FE) MG tablet Take 325 mg by mouth daily with breakfast. 1/2 tab daily  . flecainide (TAMBOCOR) 100 MG tablet Take 100 mg by mouth 2 (two) times daily.   . irbesartan (AVAPRO) 300 MG tablet Take 1 tablet (300 mg total) by mouth daily.  . metoprolol succinate (TOPROL-XL) 50 MG 24 hr tablet Take 50 mg by mouth daily.   . Multiple Vitamins-Minerals (MULTIVITAMIN WITH MINERALS) tablet Take 1 tablet by mouth daily.  . pravastatin (PRAVACHOL) 20 MG tablet Take 1 tablet (20 mg total) by mouth daily.   No facility-administered encounter medications on file as of 01/31/2019.     Allergies  Allergen Reactions  . Fosamax [Alendronate Sodium]     Dizziness, elevated blood pressure  . Sertraline Nausea Only    Insomnia, did not feel well  . Sulfa Antibiotics Nausea Only    Review of Systems  Constitutional: Positive for fatigue. Negative for activity change, appetite change, chills, diaphoresis, fever and unexpected weight change.  HENT: Negative.   Eyes: Negative.  Negative for photophobia and visual disturbance.  Respiratory: Negative for cough, chest tightness and shortness of breath.   Cardiovascular: Negative for chest pain, palpitations and leg swelling.  Gastrointestinal: Negative for abdominal pain, blood in stool, constipation, diarrhea, nausea and vomiting.  Endocrine: Negative.   Genitourinary: Negative for dysuria, frequency and urgency.  Musculoskeletal: Negative for arthralgias and myalgias.  Skin: Negative.        Loss of hair.  Allergic/Immunologic: Negative.   Neurological: Negative for dizziness,  tremors, seizures, syncope, facial asymmetry, speech difficulty, weakness, light-headedness, numbness and headaches.  Hematological: Negative.   Psychiatric/Behavioral: Negative for confusion, hallucinations, sleep disturbance and suicidal ideas.  All other systems reviewed and are negative.       Objective:  BP 135/64 (BP Location: Right Arm, Cuff Size: Small)   Pulse (!) 57   Temp (!) 96.9 F (36.1 C)   Resp 18  Ht '5\' 3"'  (1.6 m)   Wt 112 lb (50.8 kg)   SpO2 99%   BMI 19.84 kg/m    Wt Readings from Last 3 Encounters:  01/31/19 112 lb (50.8 kg)  12/13/18 110 lb (49.9 kg)  10/31/18 110 lb 12.8 oz (50.3 kg)    Physical Exam Vitals signs and nursing note reviewed.  Constitutional:      General: She is not in acute distress.    Appearance: Normal appearance. She is well-developed, well-groomed and normal weight. She is not ill-appearing, toxic-appearing or diaphoretic.  HENT:     Head: Normocephalic and atraumatic.     Jaw: There is normal jaw occlusion.     Right Ear: Hearing normal.     Left Ear: Hearing normal.     Nose: Nose normal.     Mouth/Throat:     Lips: Pink.     Mouth: Mucous membranes are moist.     Pharynx: Oropharynx is clear. Uvula midline.  Eyes:     General: Lids are normal.     Extraocular Movements: Extraocular movements intact.     Conjunctiva/sclera: Conjunctivae normal.     Pupils: Pupils are equal, round, and reactive to light.  Neck:     Musculoskeletal: Normal range of motion and neck supple.     Thyroid: No thyroid mass, thyromegaly or thyroid tenderness.     Vascular: No carotid bruit or JVD.     Trachea: Trachea and phonation normal.  Cardiovascular:     Rate and Rhythm: Normal rate and regular rhythm.     Chest Wall: PMI is not displaced.     Pulses: Normal pulses.     Heart sounds: Normal heart sounds. No murmur. No friction rub. No gallop.   Pulmonary:     Effort: Pulmonary effort is normal. No respiratory distress.     Breath  sounds: Normal breath sounds. No wheezing.  Abdominal:     General: Bowel sounds are normal. There is no distension or abdominal bruit.     Palpations: Abdomen is soft. There is no hepatomegaly or splenomegaly.     Tenderness: There is no abdominal tenderness. There is no right CVA tenderness or left CVA tenderness.     Hernia: No hernia is present.  Musculoskeletal: Normal range of motion.     Right lower leg: No edema.     Left lower leg: No edema.  Lymphadenopathy:     Cervical: No cervical adenopathy.  Skin:    General: Skin is warm and dry.     Capillary Refill: Capillary refill takes less than 2 seconds.     Coloration: Skin is not cyanotic, jaundiced or pale.     Findings: No rash.  Neurological:     General: No focal deficit present.     Mental Status: She is alert and oriented to person, place, and time.     Cranial Nerves: Cranial nerves are intact.     Sensory: Sensation is intact.     Motor: Motor function is intact.     Coordination: Coordination is intact.     Gait: Gait is intact.     Deep Tendon Reflexes: Reflexes are normal and symmetric.  Psychiatric:        Attention and Perception: Attention and perception normal.        Mood and Affect: Mood and affect normal.        Speech: Speech normal.        Behavior: Behavior normal. Behavior is cooperative.  Thought Content: Thought content normal.        Cognition and Memory: Cognition and memory normal.        Judgment: Judgment normal.     Results for orders placed or performed in visit on 12/13/18  CMP14+EGFR  Result Value Ref Range   Glucose 81 65 - 99 mg/dL   BUN 8 8 - 27 mg/dL   Creatinine, Ser 0.62 0.57 - 1.00 mg/dL   GFR calc non Af Amer 90 >59 mL/min/1.73   GFR calc Af Amer 103 >59 mL/min/1.73   BUN/Creatinine Ratio 13 12 - 28   Sodium 135 134 - 144 mmol/L   Potassium 4.3 3.5 - 5.2 mmol/L   Chloride 94 (L) 96 - 106 mmol/L   CO2 24 20 - 29 mmol/L   Calcium 9.0 8.7 - 10.3 mg/dL   Total  Protein 6.5 6.0 - 8.5 g/dL   Albumin 4.7 3.7 - 4.7 g/dL   Globulin, Total 1.8 1.5 - 4.5 g/dL   Albumin/Globulin Ratio 2.6 (H) 1.2 - 2.2   Bilirubin Total 0.3 0.0 - 1.2 mg/dL   Alkaline Phosphatase 60 39 - 117 IU/L   AST 20 0 - 40 IU/L   ALT 11 0 - 32 IU/L       Pertinent labs & imaging results that were available during my care of the patient were reviewed by me and considered in my medical decision making.  Assessment & Plan:  Mary [name] was seen today for medical management of chronic issues.  Diagnoses and all orders for this visit:  Anxiety Doing well on Celexa. Will recheck CMP today to make sure sodium level is stable.   Hyponatremia -     CMP14+Fe+CBC/D/Plt+TIBC+Fer...  Loss of hair Other fatigue Ongoing symptoms. Followed by dermatology. Would like follow up labs today.  -     CMP14+Fe+CBC/D/Plt+TIBC+Fer...     Continue all other maintenance medications.  Follow up plan: Return in about 3 months (around 05/03/2019), or if symptoms worsen or fail to improve.  Continue healthy lifestyle choices, including diet (rich in fruits, vegetables, and lean proteins, and low in salt and simple carbohydrates) and exercise (at least 30 minutes of moderate physical activity daily).   The above assessment and management plan was discussed with the patient. The patient verbalized understanding of and has agreed to the management plan. Patient is aware to call the clinic if they develop any new symptoms or if symptoms persist or worsen. Patient is aware when to return to the clinic for a follow-up visit. Patient educated on when it is appropriate to go to the emergency department.   Monia Pouch, FNP-C [facility] [phone]

## 2019-02-01 ENCOUNTER — Other Ambulatory Visit: Payer: Self-pay

## 2019-02-01 ENCOUNTER — Other Ambulatory Visit: Payer: Medicare Other

## 2019-02-01 ENCOUNTER — Other Ambulatory Visit: Payer: Self-pay | Admitting: *Deleted

## 2019-02-01 DIAGNOSIS — R7989 Other specified abnormal findings of blood chemistry: Secondary | ICD-10-CM

## 2019-02-01 DIAGNOSIS — R5383 Other fatigue: Secondary | ICD-10-CM | POA: Diagnosis not present

## 2019-02-01 DIAGNOSIS — L659 Nonscarring hair loss, unspecified: Secondary | ICD-10-CM | POA: Diagnosis not present

## 2019-02-01 LAB — CMP14+FE+CBC/D/PLT+TIBC+FER...
ALT: 11 IU/L (ref 0–32)
AST: 22 IU/L (ref 0–40)
Albumin/Globulin Ratio: 2.1 (ref 1.2–2.2)
Albumin: 4.7 g/dL (ref 3.7–4.7)
Alkaline Phosphatase: 66 IU/L (ref 39–117)
BUN/Creatinine Ratio: 18 (ref 12–28)
BUN: 10 mg/dL (ref 8–27)
Basophils Absolute: 0.1 10*3/uL (ref 0.0–0.2)
Basos: 1 %
Bilirubin Total: 0.3 mg/dL (ref 0.0–1.2)
CO2: 25 mmol/L (ref 20–29)
Calcium: 8.9 mg/dL (ref 8.7–10.3)
Chloride: 92 mmol/L — ABNORMAL LOW (ref 96–106)
Creatinine, Ser: 0.55 mg/dL — ABNORMAL LOW (ref 0.57–1.00)
EOS (ABSOLUTE): 0.2 10*3/uL (ref 0.0–0.4)
Eos: 3 %
Ferritin: 41 ng/mL (ref 15–150)
Folate: >20 ng/mL (ref >3.0)
GFR calc Af Amer: 108 mL/min/{1.73_m2} (ref >59)
GFR calc non Af Amer: 93 mL/min/{1.73_m2} (ref >59)
Globulin, Total: 2.2 g/dL (ref 1.5–4.5)
Glucose: 105 mg/dL — ABNORMAL HIGH (ref 65–99)
Hematocrit: 34.7 % (ref 34.0–46.6)
Hemoglobin: 12.1 g/dL (ref 11.1–15.9)
Homocysteine: 7.7 umol/L (ref 0.0–19.2)
Immature Grans (Abs): 0 10*3/uL (ref 0.0–0.1)
Immature Granulocytes: 0 %
Iron Saturation: 27 % (ref 15–55)
Iron: 95 ug/dL (ref 27–139)
Lymphocytes Absolute: 1.3 10*3/uL (ref 0.7–3.1)
Lymphs: 18 %
MCH: 31.4 pg (ref 26.6–33.0)
MCHC: 34.9 g/dL (ref 31.5–35.7)
MCV: 90 fL (ref 79–97)
Monocytes Absolute: 0.8 10*3/uL (ref 0.1–0.9)
Monocytes: 12 %
Neutrophils Absolute: 4.6 10*3/uL (ref 1.4–7.0)
Neutrophils: 66 %
Platelets: 262 10*3/uL (ref 150–450)
Potassium: 3.9 mmol/L (ref 3.5–5.2)
RBC: 3.85 x10E6/uL (ref 3.77–5.28)
RDW: 11.5 % — ABNORMAL LOW (ref 11.7–15.4)
Sodium: 131 mmol/L — ABNORMAL LOW (ref 134–144)
Testosterone: <3 ng/dL — ABNORMAL LOW (ref 3–41)
Total Iron Binding Capacity: 347 ug/dL (ref 250–450)
Total Protein: 6.9 g/dL (ref 6.0–8.5)
Transferrin: 203 mg/dL (ref 192–364)
UIBC: 252 ug/dL (ref 118–369)
Vit D, 25-Hydroxy: 49.4 ng/mL (ref 30.0–100.0)
Vitamin B-12: 409 pg/mL (ref 232–1245)
WBC: 7 10*3/uL (ref 3.4–10.8)

## 2019-02-01 LAB — SPECIMEN STATUS REPORT

## 2019-02-01 NOTE — Addendum Note (Signed)
Addended by: Baruch Gouty on: 02/01/2019 03:28 PM   Modules accepted: Orders

## 2019-02-02 LAB — ZINC: Zinc: 63 ug/dL (ref 56–134)

## 2019-02-06 DIAGNOSIS — Z1231 Encounter for screening mammogram for malignant neoplasm of breast: Secondary | ICD-10-CM | POA: Diagnosis not present

## 2019-02-12 LAB — ZINC: Zinc: 71 ug/dL (ref 56–134)

## 2019-02-12 LAB — COENZYME Q10, TOTAL: Coenzyme Q10, Total: 0.41 ug/mL (ref 0.37–2.20)

## 2019-02-27 DIAGNOSIS — H2513 Age-related nuclear cataract, bilateral: Secondary | ICD-10-CM | POA: Diagnosis not present

## 2019-03-01 ENCOUNTER — Other Ambulatory Visit: Payer: Self-pay

## 2019-03-01 ENCOUNTER — Other Ambulatory Visit: Payer: Medicare Other

## 2019-03-01 DIAGNOSIS — R7989 Other specified abnormal findings of blood chemistry: Secondary | ICD-10-CM

## 2019-03-02 LAB — CMP14+EGFR
ALT: 11 IU/L (ref 0–32)
AST: 19 IU/L (ref 0–40)
Albumin/Globulin Ratio: 2.5 — ABNORMAL HIGH (ref 1.2–2.2)
Albumin: 4.7 g/dL (ref 3.7–4.7)
Alkaline Phosphatase: 67 IU/L (ref 39–117)
BUN/Creatinine Ratio: 20 (ref 12–28)
BUN: 11 mg/dL (ref 8–27)
Bilirubin Total: 0.3 mg/dL (ref 0.0–1.2)
CO2: 25 mmol/L (ref 20–29)
Calcium: 9.1 mg/dL (ref 8.7–10.3)
Chloride: 93 mmol/L — ABNORMAL LOW (ref 96–106)
Creatinine, Ser: 0.54 mg/dL — ABNORMAL LOW (ref 0.57–1.00)
GFR calc Af Amer: 108 mL/min/{1.73_m2} (ref >59)
GFR calc non Af Amer: 93 mL/min/{1.73_m2} (ref >59)
Globulin, Total: 1.9 g/dL (ref 1.5–4.5)
Glucose: 96 mg/dL (ref 65–99)
Potassium: 4.1 mmol/L (ref 3.5–5.2)
Sodium: 133 mmol/L — ABNORMAL LOW (ref 134–144)
Total Protein: 6.6 g/dL (ref 6.0–8.5)

## 2019-03-07 ENCOUNTER — Other Ambulatory Visit: Payer: Self-pay | Admitting: *Deleted

## 2019-03-07 DIAGNOSIS — I10 Essential (primary) hypertension: Secondary | ICD-10-CM

## 2019-03-07 MED ORDER — AMLODIPINE BESYLATE 5 MG PO TABS: 5.0000 mg | ORAL_TABLET | Freq: Every day | ORAL | 0 refills | Status: DC

## 2019-03-08 DIAGNOSIS — H25043 Posterior subcapsular polar age-related cataract, bilateral: Secondary | ICD-10-CM | POA: Diagnosis not present

## 2019-03-08 DIAGNOSIS — H2513 Age-related nuclear cataract, bilateral: Secondary | ICD-10-CM | POA: Diagnosis not present

## 2019-03-08 DIAGNOSIS — H25013 Cortical age-related cataract, bilateral: Secondary | ICD-10-CM | POA: Diagnosis not present

## 2019-03-08 DIAGNOSIS — H18413 Arcus senilis, bilateral: Secondary | ICD-10-CM | POA: Diagnosis not present

## 2019-03-12 ENCOUNTER — Other Ambulatory Visit: Payer: Self-pay | Admitting: Family Medicine

## 2019-03-12 DIAGNOSIS — I10 Essential (primary) hypertension: Secondary | ICD-10-CM

## 2019-03-12 DIAGNOSIS — E785 Hyperlipidemia, unspecified: Secondary | ICD-10-CM

## 2019-05-28 ENCOUNTER — Other Ambulatory Visit: Payer: Self-pay | Admitting: Family Medicine

## 2019-05-28 DIAGNOSIS — I10 Essential (primary) hypertension: Secondary | ICD-10-CM

## 2019-07-03 ENCOUNTER — Telehealth: Payer: Self-pay | Admitting: Family Medicine

## 2019-07-03 NOTE — Chronic Care Management (AMB) (Signed)
  Chronic Care Management   Note  07/03/2019 Name: Mary [name] MRN: [id number] DOB: 13-May-1945  Mary [name] is a 74 y.o. year old female who is a primary care patient of Rakes, Connye Burkitt, FNP. I reached out to [name] by phone today in response to a referral sent by Mary [name] health plan.     Mary [name] was given information about Chronic Care Management services today including:  1. CCM service includes personalized support from designated clinical staff supervised by her physician, including individualized plan of care and coordination with other care providers 2. 24/7 contact phone numbers for assistance for urgent and routine care needs. 3. Service will only be billed when office clinical staff spend 20 minutes or more in a month to coordinate care. 4. Only one practitioner may furnish and bill the service in a calendar month. 5. The patient may stop CCM services at any time (effective at the end of the month) by phone call to the office staff. 6. The patient will be responsible for cost sharing (co-pay) of up to 20% of the service fee (after annual deductible is met).  Patient did not agree to enrollment in care management services and does not wish to consider at this time.  Follow up plan: The patient has been provided with contact information for the care management team and has been advised to call with any health related questions or concerns.   [employer] [employer], [place] [address] Direct Dial: [phone] [email or web address]

## 2019-07-08 ENCOUNTER — Other Ambulatory Visit: Payer: Self-pay

## 2019-07-08 ENCOUNTER — Emergency Department (HOSPITAL_COMMUNITY)
Admission: EM | Admit: 2019-07-08 | Discharge: 2019-07-08 | Disposition: A | Discharge status: Home / Self Care | Payer: Medicare Other | Attending: Emergency Medicine | Admitting: Emergency Medicine

## 2019-07-08 ENCOUNTER — Encounter (HOSPITAL_COMMUNITY): Payer: Self-pay | Admitting: Emergency Medicine

## 2019-07-08 DIAGNOSIS — B028 Zoster with other complications: Secondary | ICD-10-CM | POA: Diagnosis not present

## 2019-07-08 DIAGNOSIS — Z7982 Long term (current) use of aspirin: Secondary | ICD-10-CM | POA: Diagnosis not present

## 2019-07-08 DIAGNOSIS — I10 Essential (primary) hypertension: Secondary | ICD-10-CM | POA: Diagnosis not present

## 2019-07-08 DIAGNOSIS — R21 Rash and other nonspecific skin eruption: Secondary | ICD-10-CM | POA: Diagnosis present

## 2019-07-08 DIAGNOSIS — B029 Zoster without complications: Secondary | ICD-10-CM | POA: Diagnosis not present

## 2019-07-08 DIAGNOSIS — Z79899 Other long term (current) drug therapy: Secondary | ICD-10-CM | POA: Insufficient documentation

## 2019-07-08 LAB — URINALYSIS, ROUTINE W REFLEX MICROSCOPIC
Bilirubin Urine: NEGATIVE
Glucose, UA: NEGATIVE mg/dL
Ketones, ur: NEGATIVE mg/dL
Nitrite: NEGATIVE
Protein, ur: NEGATIVE mg/dL
Specific Gravity, Urine: 1.005 (ref 1.005–1.030)
pH: 7 (ref 5.0–8.0)

## 2019-07-08 MED ORDER — VALACYCLOVIR HCL 1 G PO TABS: 1000.0000 mg | ORAL_TABLET | Freq: Three times a day (TID) | ORAL | 0 refills | Status: AC

## 2019-07-08 MED ORDER — PREDNISONE 20 MG PO TABS: 20.0000 mg | ORAL_TABLET | Freq: Every day | ORAL | 0 refills | Status: DC

## 2019-07-08 NOTE — Discharge Instructions (Signed)
Take Valtrex 3 times daily for one week Take Prednisone once daily for 5 days Please follow up with your doctor

## 2019-07-08 NOTE — ED Provider Notes (Signed)
[place] EMERGENCY DEPARTMENT Provider Note   CSN: [id number] Arrival date & time: 07/08/19  1114     History Chief Complaint  Patient presents with  . Vaginitis  . Wound Check    Mary [name] is a 74 y.o. female who presents with a rash. She states that she thought she had a flare of her hemorrhoids because she started to have pain in the rectal area and then she broke out in a rash. She attributed it to using panty liners. The rash is on the right buttock and now has moved to the vaginal area. It hurts to urinate and have a BM. She has never had this before. She is not sexually active since her husband passed away 2 years ago. She has not had the shingles vaccine.  HPI     Past Medical History:  Diagnosis Date  . Anxiety   . Arthritis   . Hypertension   . Irregular heart beat     Patient Active Problem List   Diagnosis Date Noted  . Loss of hair 01/31/2019  . Atrial fibrillation ([place]) 09/28/2016  . Hyperlipidemia 09/28/2016  . Essential hypertension with goal blood pressure less than 140/90 11/11/2015  . Osteoporosis 06/18/2015  . Anxiety 08/20/2014  . Hyponatremia 08/20/2014    Past Surgical History:  Procedure Laterality Date  . ABDOMINAL HYSTERECTOMY    . KNEE ARTHROSCOPY    . TONSILLECTOMY       OB History   No obstetric history on file.     Family History  Problem Relation Age of Onset  . Arthritis Mother   . Heart disease Mother   . Hypertension Mother   . Hyperlipidemia Mother   . Kidney disease Mother   . Hypertension Brother   . Stroke Maternal Grandmother   . Heart disease Maternal Grandfather   . Arthritis Paternal Grandmother     Social History   Tobacco Use  . Smoking status: Never Smoker  . Smokeless tobacco: Never Used  Substance Use Topics  . Alcohol use: Yes    Alcohol/week: 7.0 standard drinks    Types: 7 Glasses of wine per week  . Drug use: No    Home Medications Prior to Admission medications    Medication Sig Start Date End Date Taking? Authorizing Provider  amLODipine (NORVASC) 5 MG tablet Take 1 tablet (5 mg total) by mouth daily. (Needs to be seen before next refill) 05/28/19   Rakes, Connye Burkitt, FNP  aspirin 325 MG tablet Take 325 mg by mouth daily.    [provider]  Cholecalciferol (VITAMIN D3) 125 MCG (5000 UT) CAPS Take 5,000 Units by mouth.    [provider]  citalopram (CELEXA) 10 MG tablet Take 1 tablet (10 mg total) by mouth daily. 12/13/18 01/31/19  Baruch Gouty, FNP  ferrous sulfate 325 (65 FE) MG tablet Take 325 mg by mouth daily with breakfast. 1/2 tab daily    [provider]  flecainide (TAMBOCOR) 100 MG tablet Take 100 mg by mouth 2 (two) times daily.  09/23/16   [provider]  irbesartan (AVAPRO) 300 MG tablet TAKE 1 TABLET BY MOUTH  DAILY 03/12/19   Rakes, Connye Burkitt, FNP  metoprolol succinate (TOPROL-XL) 50 MG 24 hr tablet Take 50 mg by mouth daily.  09/23/16   [provider]  Multiple Vitamins-Minerals (MULTIVITAMIN WITH MINERALS) tablet Take 1 tablet by mouth daily.    [provider]  pravastatin (PRAVACHOL) 20 MG tablet TAKE 1  TABLET BY MOUTH  DAILY 03/12/19   Rakes, Connye Burkitt, FNP    Allergies    Fosamax [alendronate sodium], Sertraline, and Sulfa antibiotics  Review of Systems   Review of Systems  Constitutional: Negative for fever.  Gastrointestinal: Positive for constipation.       +abdominal bloating  Genitourinary: Positive for dysuria.  Skin: Positive for rash.  All other systems reviewed and are negative.   Physical Exam Updated Vital Signs BP (!) 165/84 (BP Location: Right Arm)   Pulse 70   Temp 98.3 F (36.8 C) (Oral)   Resp 14   Ht 5\' 3"  (1.6 m)   Wt 49.9 kg   SpO2 100%   BMI 19.49 kg/m   Physical Exam Vitals and nursing note reviewed.  Constitutional:      General: She is not in acute distress.    Appearance: Normal appearance. She is well-developed. She is not ill-appearing.    HENT:     Head: Normocephalic and atraumatic.  Eyes:     General: No scleral icterus.       Right eye: No discharge.        Left eye: No discharge.     Conjunctiva/sclera: Conjunctivae normal.     Pupils: Pupils are equal, round, and reactive to light.  Cardiovascular:     Rate and Rhythm: Normal rate.  Pulmonary:     Effort: Pulmonary effort is normal. No respiratory distress.  Abdominal:     General: There is no distension.  Genitourinary:    Comments: Erythematous rash with vesicular lesions and necrotic area on the right buttock and right labia. Rash does not cross the midline  Rectal: Non-bleeding, non-thrombosed hemorrhoids Musculoskeletal:     Cervical back: Normal range of motion.  Skin:    General: Skin is warm and dry.  Neurological:     Mental Status: She is alert and oriented to person, place, and time.  Psychiatric:        Behavior: Behavior normal.         ED Results / Procedures / Treatments   Labs (all labs ordered are listed, but only abnormal results are displayed) Labs Reviewed  URINALYSIS, ROUTINE W REFLEX MICROSCOPIC - Abnormal; Notable for the following components:      Result Value   Hgb urine dipstick SMALL (*)    Leukocytes,Ua SMALL (*)    Bacteria, UA FEW (*)    All other components within normal limits  URINE CULTURE    EKG None  Radiology No results found.  Procedures Procedures (including critical care time)  Medications Ordered in ED Medications - No data to display  ED Course  I have reviewed the triage vital signs and the nursing notes.  Pertinent labs & imaging results that were available during my care of the patient were reviewed by me and considered in my medical decision making (see chart for details).  74 year old female presents with a rash on the R buttock and R labia for one week. BP is elevated but otherwise vitals are normal. Heart is regular rate and rhythm. Lungs are CTA. Abdomen is soft, non-tender. Rash  appears consistent with shingles and does not cross the midline. UA was obtained which does not show definite infection. I think her urinary discomfort is from the rash. Urine culture sent.   Shared visit with Dr. Lacinda Axon. Will treat with Valtrex and Prednisone. Advised f/u with PCP.   MDM Rules/Calculators/A&P  Final Clinical Impression(s) / ED Diagnoses Final diagnoses:  Herpes zoster without complication    Rx / DC Orders ED Discharge Orders    None       Recardo Evangelist, PA-C 07/08/19 1348    Nat Christen, MD 07/08/19 [phone]

## 2019-07-08 NOTE — ED Triage Notes (Signed)
Pt arrives pov. Pt reports vaginal bleeding and irritation for past week. On Monday pt noticed sacral wound. Wound is red around the edge and black in the center. Pt says wound spread all the way around her body and started moving toward her vagina. Pt has dermatologist appt on 3/23 and PCP appt in April. Pt decided not to wait for those and come here to be seen.

## 2019-07-09 ENCOUNTER — Ambulatory Visit: Payer: Medicare Other | Admitting: Family Medicine

## 2019-07-09 ENCOUNTER — Telehealth: Payer: Self-pay | Admitting: Physician Assistant

## 2019-07-09 NOTE — Telephone Encounter (Signed)
noted 

## 2019-07-12 LAB — URINE CULTURE: Culture: >=100000 — AB

## 2019-07-13 ENCOUNTER — Telehealth: Payer: Self-pay

## 2019-07-13 NOTE — Telephone Encounter (Signed)
No treatment for UC ED 07/08/19 per PA

## 2019-07-17 ENCOUNTER — Ambulatory Visit: Payer: Medicare Other | Admitting: Physician Assistant

## 2019-07-19 ENCOUNTER — Telehealth: Payer: Self-pay | Admitting: Family Medicine

## 2019-07-19 ENCOUNTER — Encounter: Payer: Self-pay | Admitting: Family Medicine

## 2019-07-19 ENCOUNTER — Ambulatory Visit (INDEPENDENT_AMBULATORY_CARE_PROVIDER_SITE_OTHER): Payer: Medicare Other | Admitting: Family Medicine

## 2019-07-19 ENCOUNTER — Other Ambulatory Visit: Payer: Self-pay

## 2019-07-19 DIAGNOSIS — R339 Retention of urine, unspecified: Secondary | ICD-10-CM

## 2019-07-19 DIAGNOSIS — R8290 Unspecified abnormal findings in urine: Secondary | ICD-10-CM | POA: Diagnosis not present

## 2019-07-19 DIAGNOSIS — N3001 Acute cystitis with hematuria: Secondary | ICD-10-CM | POA: Diagnosis not present

## 2019-07-19 DIAGNOSIS — R300 Dysuria: Secondary | ICD-10-CM

## 2019-07-19 LAB — URINALYSIS, COMPLETE
Bilirubin, UA: NEGATIVE
Glucose, UA: NEGATIVE
Ketones, UA: NEGATIVE
Nitrite, UA: POSITIVE — AB
Protein,UA: NEGATIVE
Specific Gravity, UA: 1.015 (ref 1.005–1.030)
Urobilinogen, Ur: 0.2 mg/dL (ref 0.2–1.0)
pH, UA: 6.5 (ref 5.0–7.5)

## 2019-07-19 LAB — MICROSCOPIC EXAMINATION
Renal Epithel, UA: NONE SEEN /hpf
WBC, UA: >30 /hpf — AB (ref 0–5)

## 2019-07-19 MED ORDER — CIPROFLOXACIN HCL 500 MG PO TABS: 500.0000 mg | ORAL_TABLET | Freq: Two times a day (BID) | ORAL | 0 refills | Status: AC

## 2019-07-19 NOTE — Telephone Encounter (Signed)
Pt aware.

## 2019-07-19 NOTE — Addendum Note (Signed)
Addended by: Baruch Gouty on: 07/19/2019 02:00 PM   Modules accepted: Orders

## 2019-07-19 NOTE — Progress Notes (Signed)
Virtual Visit via telephone Note Due to COVID-19 pandemic this visit was conducted virtually. This visit type was conducted due to national recommendations for restrictions regarding the COVID-19 Pandemic (e.g. social distancing, sheltering in place) in an effort to limit this patient's exposure and mitigate transmission in our community. All issues noted in this document were discussed and addressed.  A physical exam was not performed with this format.   I connected with Mary [name] on 07/19/2019 at 0950 by telephone and verified that I am speaking with the correct person using two identifiers. Chakakhan Hannegan is currently located at home and no one is currently with them during visit. The provider, Monia Pouch, FNP is located in their office at time of visit.  I discussed the limitations, risks, security and privacy concerns of performing an evaluation and management service by telephone and the availability of in person appointments. I also discussed with the patient that there may be a patient responsible charge related to this service. The patient expressed understanding and agreed to proceed.  Subjective:  Patient ID: Mary [name], female    DOB: January 02, 1946, 74 y.o.   MRN: [id number]  Chief Complaint:  Dysuria   HPI: Mary [name] is a 74 y.o. female presenting on 07/19/2019 for Dysuria   Pt was recently diagnosed with shingles of the right labia and right buttock. Pt states she has now developed urinary retention. States she will feel the urge to go and will be unable to void. States she sits on the commode and will not be able to void. States she is able to void more at night. States she does have dysuria with voiding and malodorous urine. Pt states the rash from the shingles is still present and very painful. She has taken the medications given in the ED for the shingles.   Dysuria  This is a recurrent problem. The current episode started in the past 7 days. The problem  has been gradually worsening. The quality of the pain is described as burning and aching. The pain is at a severity of 4/10. The pain is mild. There has been no fever. She is not sexually active. There is no history of pyelonephritis. Associated symptoms include hesitancy. Pertinent negatives include no chills, discharge, flank pain, frequency, hematuria, nausea, possible pregnancy, sweats, urgency or vomiting. She has tried increased fluids for the symptoms. The treatment provided no relief.     Relevant past medical, surgical, family, and social history reviewed and updated as indicated.  Allergies and medications reviewed and updated.   Past Medical History:  Diagnosis Date  . Anxiety   . Arthritis   . Hypertension   . Irregular heart beat     Past Surgical History:  Procedure Laterality Date  . ABDOMINAL HYSTERECTOMY    . KNEE ARTHROSCOPY    . TONSILLECTOMY      Social History   Socioeconomic History  . Marital status: Widowed    Spouse name: Not on file  . Number of children: 3  . Years of education: 57  . Highest education level: 12th grade  Occupational History  . Occupation: retired  Tobacco Use  . Smoking status: Never Smoker  . Smokeless tobacco: Never Used  Substance and Sexual Activity  . Alcohol use: Yes    Alcohol/week: 7.0 standard drinks    Types: 7 Glasses of wine per week  . Drug use: No  . Sexual activity: Not Currently  Other Topics Concern  . Not on file  Social History Narrative  .  Not on file   Social Determinants of Health   Financial Resource Strain:   . Difficulty of Paying Living Expenses:   Food Insecurity:   . Worried About Charity fundraiser in the Last Year:   . Arboriculturist in the Last Year:   Transportation Needs:   . Film/video editor (Medical):   [name] Lack of Transportation (Non-Medical):   Physical Activity: Unknown  . Days of Exercise per Week: Not on file  . Minutes of Exercise per Session: 50 min  Stress:   .  Feeling of Stress :   Social Connections:   . Frequency of Communication with Friends and Family:   . Frequency of Social Gatherings with Friends and Family:   . Attends Religious Services:   . Active Member of Clubs or Organizations:   . Attends Archivist Meetings:   [name] Marital Status:   Intimate Partner Violence:   . Fear of Current or Ex-Partner:   . Emotionally Abused:   [name] Physically Abused:   . Sexually Abused:     Outpatient Encounter Medications as of 07/19/2019  Medication Sig  . amLODipine (NORVASC) 5 MG tablet Take 1 tablet (5 mg total) by mouth daily. (Needs to be seen before next refill)  . aspirin 325 MG tablet Take 325 mg by mouth daily.  . Cholecalciferol (VITAMIN D3) 125 MCG (5000 UT) CAPS Take 5,000 Units by mouth.  . citalopram (CELEXA) 10 MG tablet Take 1 tablet (10 mg total) by mouth daily.  . ferrous sulfate 325 (65 FE) MG tablet Take 325 mg by mouth daily with breakfast. 1/2 tab daily  . flecainide (TAMBOCOR) 100 MG tablet Take 100 mg by mouth 2 (two) times daily.   . irbesartan (AVAPRO) 300 MG tablet TAKE 1 TABLET BY MOUTH  DAILY  . metoprolol succinate (TOPROL-XL) 50 MG 24 hr tablet Take 50 mg by mouth daily.   . Multiple Vitamins-Minerals (MULTIVITAMIN WITH MINERALS) tablet Take 1 tablet by mouth daily.  . pravastatin (PRAVACHOL) 20 MG tablet TAKE 1 TABLET BY MOUTH  DAILY  . predniSONE (DELTASONE) 20 MG tablet Take 1 tablet (20 mg total) by mouth daily.   No facility-administered encounter medications on file as of 07/19/2019.    Allergies  Allergen Reactions  . Fosamax [Alendronate Sodium]     Dizziness, elevated blood pressure  . Sertraline Nausea Only    Insomnia, did not feel well  . Sulfa Antibiotics Nausea Only    Review of Systems  Constitutional: Negative for activity change, appetite change, chills, diaphoresis, fatigue, fever and unexpected weight change.  HENT: Negative.   Eyes: Negative.  Negative for photophobia and visual  disturbance.  Respiratory: Negative for cough, chest tightness and shortness of breath.   Cardiovascular: Negative for chest pain, palpitations and leg swelling.  Gastrointestinal: Positive for abdominal distention and abdominal pain. Negative for blood in stool, constipation, diarrhea, nausea and vomiting.  Endocrine: Negative.  Negative for polydipsia, polyphagia and polyuria.  Genitourinary: Positive for decreased urine volume, difficulty urinating, dysuria and hesitancy. Negative for flank pain, frequency, hematuria and urgency.  Musculoskeletal: Negative for arthralgias and myalgias.  Skin: Positive for color change and rash.  Allergic/Immunologic: Negative.   Neurological: Negative for dizziness, tremors, seizures, syncope, facial asymmetry, speech difficulty, weakness, light-headedness, numbness and headaches.  Hematological: Negative.   Psychiatric/Behavioral: Negative for confusion, hallucinations, sleep disturbance and suicidal ideas.  All other systems reviewed and are negative.        Observations/Objective: No  vital signs or physical exam, this was a telephone or virtual health encounter.  Pt alert and oriented, answers all questions appropriately, and able to speak in full sentences.    Assessment and Plan: Mary [name] was seen today for dysuria.  Diagnoses and all orders for this visit:  Urinary retention Recent diagnosis of shingles to right buttock and right labia. Pt with continued rash to labial and urethral area with urinary retention. Will place urgent referral to urology. Pt aware of symptoms that warrant a visit to the emergent department.  -     Ambulatory referral to Urology -     Urinalysis, Complete -     Urine Culture  Dysuria Malodorous urine Pt will provide urine sample. Will treat if indicated. Increase fluid intake.  -     Urinalysis, Complete -     Urine Culture     Follow Up Instructions: Return if symptoms worsen or fail to improve.    I  discussed the assessment and treatment plan with the patient. The patient was provided an opportunity to ask questions and all were answered. The patient agreed with the plan and demonstrated an understanding of the instructions.   The patient was advised to call back or seek an in-person evaluation if the symptoms worsen or if the condition fails to improve as anticipated.  The above assessment and management plan was discussed with the patient. The patient verbalized understanding of and has agreed to the management plan. Patient is aware to call the clinic if they develop any new symptoms or if symptoms persist or worsen. Patient is aware when to return to the clinic for a follow-up visit. Patient educated on when it is appropriate to go to the emergency department.    I provided 15 minutes of non-face-to-face time during this encounter. The call started at 0950. The call ended at 1005. The other time was used for coordination of care.    Monia Pouch, FNP-C [facility] [address] [place], [place] [address] [phone] 07/19/2019

## 2019-07-21 LAB — URINE CULTURE

## 2019-07-30 ENCOUNTER — Telehealth (INDEPENDENT_AMBULATORY_CARE_PROVIDER_SITE_OTHER): Payer: Medicare Other | Admitting: Family Medicine

## 2019-07-30 ENCOUNTER — Encounter: Payer: Self-pay | Admitting: Family Medicine

## 2019-07-30 DIAGNOSIS — R300 Dysuria: Secondary | ICD-10-CM | POA: Diagnosis not present

## 2019-07-30 DIAGNOSIS — N760 Acute vaginitis: Secondary | ICD-10-CM

## 2019-07-30 DIAGNOSIS — B9689 Other specified bacterial agents as the cause of diseases classified elsewhere: Secondary | ICD-10-CM

## 2019-07-30 LAB — URINALYSIS, COMPLETE
Bilirubin, UA: NEGATIVE
Glucose, UA: NEGATIVE
Ketones, UA: NEGATIVE
Leukocytes,UA: NEGATIVE
Nitrite, UA: NEGATIVE
Protein,UA: NEGATIVE
Specific Gravity, UA: 1.02 (ref 1.005–1.030)
Urobilinogen, Ur: 0.2 mg/dL (ref 0.2–1.0)
pH, UA: 6.5 (ref 5.0–7.5)

## 2019-07-30 LAB — MICROSCOPIC EXAMINATION: Renal Epithel, UA: NONE SEEN /hpf

## 2019-07-30 MED ORDER — CLOTRIMAZOLE-BETAMETHASONE 1-0.05 % EX CREA: 1.0000 "application " | TOPICAL_CREAM | Freq: Two times a day (BID) | CUTANEOUS | 0 refills | Status: DC

## 2019-07-30 NOTE — Addendum Note (Signed)
Addended by: Pollyann Kennedy F on: 07/30/2019 02:28 PM   Modules accepted: Orders

## 2019-07-30 NOTE — Progress Notes (Signed)
Virtual Visit via MyChart Video Note Due to COVID-19 pandemic this visit was conducted virtually. This visit type was conducted due to national recommendations for restrictions regarding the COVID-19 Pandemic (e.g. social distancing, sheltering in place) in an effort to limit this patient's exposure and mitigate transmission in our community. All issues noted in this document were discussed and addressed.  A physical exam was not performed with this format.   I connected with Mary [name] on 07/30/2019 at 1210 by video and verified that I am speaking with the correct person using two identifiers. Mary [name] is currently located at home and no one is currently with them during visit. The provider, Monia Pouch, FNP is located in their office at time of visit.  I discussed the limitations, risks, security and privacy concerns of performing an evaluation and management service by telephone and the availability of in person appointments. I also discussed with the patient that there may be a patient responsible charge related to this service. The patient expressed understanding and agreed to proceed.  Subjective:  Patient ID: Mary [name], female    DOB: 1945-10-22, 74 y.o.   MRN: [id number]  Chief Complaint:  Dysuria and Vaginal Itching   HPI: Mary [name] is a 74 y.o. female presenting on 07/30/2019 for Dysuria and Vaginal Itching   Pt has had recent shingles involving the right labia and groin area. Pt was referred to urology but has not received an appointment. Pt states she continues to have burning and irritation to her labia. States she has dysuria due to this irritation and is concerned it may have causes an UTI. States she is in week 4 of the shingles and does not seem to be improving.     Relevant past medical, surgical, family, and social history reviewed and updated as indicated.  Allergies and medications reviewed and updated.   Past Medical History:  Diagnosis  Date  . Anxiety   . Arthritis   . Hypertension   . Irregular heart beat     Past Surgical History:  Procedure Laterality Date  . ABDOMINAL HYSTERECTOMY    . KNEE ARTHROSCOPY    . TONSILLECTOMY      Social History   Socioeconomic History  . Marital status: Widowed    Spouse name: Not on file  . Number of children: 3  . Years of education: 71  . Highest education level: 12th grade  Occupational History  . Occupation: retired  Tobacco Use  . Smoking status: Never Smoker  . Smokeless tobacco: Never Used  Substance and Sexual Activity  . Alcohol use: Yes    Alcohol/week: 7.0 standard drinks    Types: 7 Glasses of wine per week  . Drug use: No  . Sexual activity: Not Currently  Other Topics Concern  . Not on file  Social History Narrative  . Not on file   Social Determinants of Health   Financial Resource Strain:   . Difficulty of Paying Living Expenses:   Food Insecurity:   . Worried About Charity fundraiser in the Last Year:   . Arboriculturist in the Last Year:   Transportation Needs:   . Film/video editor (Medical):   [name] Lack of Transportation (Non-Medical):   Physical Activity: Unknown  . Days of Exercise per Week: Not on file  . Minutes of Exercise per Session: 50 min  Stress:   . Feeling of Stress :   Social Connections:   . Frequency of Communication with  Friends and Family:   . Frequency of Social Gatherings with Friends and Family:   . Attends Religious Services:   . Active Member of Clubs or Organizations:   . Attends Archivist Meetings:   [name] Marital Status:   Intimate Partner Violence:   . Fear of Current or Ex-Partner:   . Emotionally Abused:   [name] Physically Abused:   . Sexually Abused:     Outpatient Encounter Medications as of 07/30/2019  Medication Sig  . amLODipine (NORVASC) 5 MG tablet Take 1 tablet (5 mg total) by mouth daily. (Needs to be seen before next refill)  . aspirin 325 MG tablet Take 325 mg by mouth daily.  .  Cholecalciferol (VITAMIN D3) 125 MCG (5000 UT) CAPS Take 5,000 Units by mouth.  . citalopram (CELEXA) 10 MG tablet Take 1 tablet (10 mg total) by mouth daily.  . clotrimazole-betamethasone (LOTRISONE) cream Apply 1 application topically 2 (two) times daily.  . ferrous sulfate 325 (65 FE) MG tablet Take 325 mg by mouth daily with breakfast. 1/2 tab daily  . flecainide (TAMBOCOR) 100 MG tablet Take 100 mg by mouth 2 (two) times daily.   . irbesartan (AVAPRO) 300 MG tablet TAKE 1 TABLET BY MOUTH  DAILY  . metoprolol succinate (TOPROL-XL) 50 MG 24 hr tablet Take 50 mg by mouth daily.   . Multiple Vitamins-Minerals (MULTIVITAMIN WITH MINERALS) tablet Take 1 tablet by mouth daily.  . pravastatin (PRAVACHOL) 20 MG tablet TAKE 1 TABLET BY MOUTH  DAILY  . predniSONE (DELTASONE) 20 MG tablet Take 1 tablet (20 mg total) by mouth daily.   No facility-administered encounter medications on file as of 07/30/2019.    Allergies  Allergen Reactions  . Fosamax [Alendronate Sodium]     Dizziness, elevated blood pressure  . Sertraline Nausea Only    Insomnia, did not feel well  . Sulfa Antibiotics Nausea Only    Review of Systems  Constitutional: Negative for activity change, appetite change, chills, fatigue and fever.  HENT: Negative.   Eyes: Negative.   Respiratory: Negative for cough, chest tightness and shortness of breath.   Cardiovascular: Negative for chest pain, palpitations and leg swelling.  Gastrointestinal: Negative for abdominal pain, blood in stool, constipation, diarrhea, nausea and vomiting.  Endocrine: Negative.   Genitourinary: Positive for dysuria, genital sores and vaginal pain. Negative for decreased urine volume, difficulty urinating, dyspareunia, enuresis, flank pain, frequency, hematuria, menstrual problem, pelvic pain, urgency, vaginal bleeding and vaginal discharge.  Musculoskeletal: Negative for arthralgias and myalgias.  Skin: Positive for color change and rash.    Allergic/Immunologic: Negative.   Neurological: Negative for dizziness, weakness and headaches.  Hematological: Negative.   Psychiatric/Behavioral: Negative for confusion, hallucinations, sleep disturbance and suicidal ideas.  All other systems reviewed and are negative.        Observations/Objective: No vital signs or physical exam, this was a telephone or virtual health encounter.  Pt alert and oriented, answers all questions appropriately, and able to speak in full sentences.    Assessment and Plan: Diagnoses and all orders for this visit:  Acute vaginitis Ongoing irritation post shingles. Will trial below to see if beneficial. Pt aware to report any new, worsening, or persistent symptoms.  -     clotrimazole-betamethasone (LOTRISONE) cream; Apply 1 application topically 2 (two) times daily.  Dysuria Pt to provide urine sample. Treatment pending results.  -     Urinalysis, Complete -     Urine Culture; Future     Follow Up  Instructions: Return if symptoms worsen or fail to improve.    I discussed the assessment and treatment plan with the patient. The patient was provided an opportunity to ask questions and all were answered. The patient agreed with the plan and demonstrated an understanding of the instructions.   The patient was advised to call back or seek an in-person evaluation if the symptoms worsen or if the condition fails to improve as anticipated.  The above assessment and management plan was discussed with the patient. The patient verbalized understanding of and has agreed to the management plan. Patient is aware to call the clinic if they develop any new symptoms or if symptoms persist or worsen. Patient is aware when to return to the clinic for a follow-up visit. Patient educated on when it is appropriate to go to the emergency department.    I provided 15 minutes of non-face-to-face time during this encounter. The call started at 1210. The call ended at  1220. The other time was used for coordination of care.    Monia Pouch, FNP-C [facility] [address] [place], [place] [address] [phone] 07/30/2019

## 2019-08-01 LAB — URINE CULTURE

## 2019-08-06 ENCOUNTER — Ambulatory Visit (INDEPENDENT_AMBULATORY_CARE_PROVIDER_SITE_OTHER): Payer: Medicare Other | Admitting: Family Medicine

## 2019-08-06 ENCOUNTER — Encounter: Payer: Self-pay | Admitting: Family Medicine

## 2019-08-06 ENCOUNTER — Other Ambulatory Visit: Payer: Self-pay

## 2019-08-06 VITALS — BP 131/72 | HR 64 | Temp 99.2°F | Ht 63.0 in | Wt 110.2 lb

## 2019-08-06 DIAGNOSIS — B029 Zoster without complications: Secondary | ICD-10-CM

## 2019-08-06 DIAGNOSIS — M810 Age-related osteoporosis without current pathological fracture: Secondary | ICD-10-CM

## 2019-08-06 DIAGNOSIS — F419 Anxiety disorder, unspecified: Secondary | ICD-10-CM

## 2019-08-06 DIAGNOSIS — R7989 Other specified abnormal findings of blood chemistry: Secondary | ICD-10-CM | POA: Diagnosis not present

## 2019-08-06 DIAGNOSIS — I480 Paroxysmal atrial fibrillation: Secondary | ICD-10-CM

## 2019-08-06 NOTE — Patient Instructions (Addendum)
I am thinking about Mirtazapine for you instead of Celexa.  This still has a risk of low sodium but LESS risk than the Lexapro and Celexa.  I will contact you with a plan.  You had labs performed today.  You will be contacted with the results of the labs once they are available, usually in the next 3 business days for routine lab work.  If you have an active my chart account, they will be released to your MyChart.  If you prefer to have these labs released to you via telephone, please let us know.    Hyponatremia Hyponatremia is when the amount of salt (sodium) in your blood is too low. When salt levels are low, your body may take in extra water. This can cause swelling throughout the body. The swelling often affects the brain. What are the causes? This condition may be caused by:  Certain medical problems or conditions.  Vomiting a lot.  Having watery poop (diarrhea) often.  Certain medicines or illegal drugs.  Not having enough water in the body (dehydration).  Drinking too much water.  Eating a diet that is low in salt.  Large burns on your body.  Too much sweating. What increases the risk? You are more likely to get this condition if you:  Have long-term (chronic) kidney disease.  Have heart failure.  Have a medical condition that causes you to have watery poop often.  Do very hard exercises.  Take medicines that affect the amount of salt is in your blood. What are the signs or symptoms? Symptoms of this condition include:  Headache.  Feeling like you may vomit (nausea).  Vomiting.  Being very tired (lethargic).  Muscle weakness and cramps.  Not wanting to eat as much as normal (loss of appetite).  Feeling weak or light-headed. Severe symptoms of this condition include:  Confusion.  Feeling restless (agitation).  Having a fast heart rate.  Passing out (fainting).  Seizures.  Coma. How is this treated? Treatment for this condition depends on  the cause. Treatment may include:  Getting fluids through an IV tube that is put into one of your veins.  Taking medicines to fix the salt levels in your blood. If medicines are causing the problem, your medicines will need to be changed.  Limiting how much water or fluid you take in.  Monitoring in the hospital to watch your symptoms. Follow these instructions at home:   Take over-the-counter and prescription medicines only as told by your doctor. Many medicines can make this condition worse. Talk with your doctor about any medicines that you are taking.  Eat and drink exactly as you are told by your doctor. ? Eat only the foods you are told to eat. ? Limit how much fluid you take.  Do not drink alcohol.  Keep all follow-up visits as told by your doctor. This is important. Contact a doctor if:  You feel more like you may vomit.  You feel more tired.  Your headache gets worse.  You feel more confused.  You feel weaker.  Your symptoms go away and then they come back.  You have trouble following the diet instructions. Get help right away if:  You have a seizure.  You pass out.  You keep having watery poop.  You keep vomiting. Summary  Hyponatremia is when the amount of salt in your blood is too low.  When salt levels are low, you can have swelling throughout the body. The swelling mostly affects  the brain.  Treatment depends on the cause. Treatment may include getting IV fluids, medicines, or not drinking as much fluid. This information is not intended to replace advice given to you by your health care provider. Make sure you discuss any questions you have with your health care provider. Document Revised: 06/29/2018 Document Reviewed: 03/16/2018 Elsevier Patient Education  [place].

## 2019-08-06 NOTE — Progress Notes (Signed)
Subjective: CC: est care, afib, Depressive disorder PCP: Baruch Gouty, FNP [id number] Mary [name] is a 74 y.o. female presenting to clinic today for:  1.  A. fib Patient reports that she is regularly followed by cardiology.  She is on flecainide, metoprolol.  She takes a full dose aspirin daily.  She used to be on Eliquis but this became unaffordable and she was not willing to sacrifice vegetables to go on warfarin when there was only a 2% difference between Eliquis and aspirin.  She overall does well and tries to keep up balanced diet, remain physically active.  He does not report any chest pain, shortness of breath, heart palpitations  2.  Depressive/anxiety disorder Patient reports that she used to be really well controlled on Lexapro but unfortunately she started having hyponatremia from this.  She was ultimately transitioned over to Celexa, which she never really started because after reading about it she felt that this was very similar to Lexapro.  She had some old Xanax that she has been using intermittently for sleep.  3.  Shingles Patient reports she had a shingles outbreak in her genital area that extended up to the buttock.  She is still having some burning and tingling in the buttock but overall symptoms seem to be getting better.  No open lesions, crusted lesions.  Rash has essentially resolved.  She has a follow-up with urology soon on 7 May to evaluate for possible bladder involvement.  She was having some difficulty voiding her bladder when this infection occurred.   ROS: Per HPI  Allergies  Allergen Reactions  . Fosamax [Alendronate Sodium]     Dizziness, elevated blood pressure  . Sertraline Nausea Only    Insomnia, did not feel well  . Sulfa Antibiotics Nausea Only   Past Medical History:  Diagnosis Date  . Anxiety   . Arthritis   . Hypertension   . Irregular heart beat     Current Outpatient Medications:  .  amLODipine (NORVASC) 5 MG tablet, Take 1 tablet (5  mg total) by mouth daily. (Needs to be seen before next refill), Disp: 90 tablet, Rfl: 0 .  aspirin 325 MG tablet, Take 325 mg by mouth daily., Disp: , Rfl:  .  Cholecalciferol (VITAMIN D3) 125 MCG (5000 UT) CAPS, Take 5,000 Units by mouth., Disp: , Rfl:  .  clotrimazole-betamethasone (LOTRISONE) cream, Apply 1 application topically 2 (two) times daily., Disp: 30 g, Rfl: 0 .  flecainide (TAMBOCOR) 100 MG tablet, Take 100 mg by mouth 2 (two) times daily. , Disp: , Rfl:  .  irbesartan (AVAPRO) 300 MG tablet, TAKE 1 TABLET BY MOUTH  DAILY, Disp: 90 tablet, Rfl: 3 .  metoprolol succinate (TOPROL-XL) 50 MG 24 hr tablet, Take 50 mg by mouth daily. , Disp: , Rfl:  .  Multiple Vitamins-Minerals (MULTIVITAMIN WITH MINERALS) tablet, Take 1 tablet by mouth daily., Disp: , Rfl:  .  pravastatin (PRAVACHOL) 20 MG tablet, TAKE 1 TABLET BY MOUTH  DAILY, Disp: 90 tablet, Rfl: 3 Social History   Socioeconomic History  . Marital status: Widowed    Spouse name: Not on file  . Number of children: 3  . Years of education: 53  . Highest education level: 12th grade  Occupational History  . Occupation: retired  Tobacco Use  . Smoking status: Never Smoker  . Smokeless tobacco: Never Used  Substance and Sexual Activity  . Alcohol use: Yes    Alcohol/week: 7.0 standard drinks    Types: 7  Glasses of wine per week  . Drug use: No  . Sexual activity: Not Currently  Other Topics Concern  . Not on file  Social History Narrative  . Not on file   Social Determinants of Health   Financial Resource Strain:   . Difficulty of Paying Living Expenses:   Food Insecurity:   . Worried About Charity fundraiser in the Last Year:   . Arboriculturist in the Last Year:   Transportation Needs:   . Film/video editor (Medical):   [name] Lack of Transportation (Non-Medical):   Physical Activity: Unknown  . Days of Exercise per Week: Not on file  . Minutes of Exercise per Session: 50 min  Stress:   . Feeling of Stress :     Social Connections:   . Frequency of Communication with Friends and Family:   . Frequency of Social Gatherings with Friends and Family:   . Attends Religious Services:   . Active Member of Clubs or Organizations:   . Attends Archivist Meetings:   [name] Marital Status:   Intimate Partner Violence:   . Fear of Current or Ex-Partner:   . Emotionally Abused:   [name] Physically Abused:   . Sexually Abused:    Family History  Problem Relation Age of Onset  . Arthritis Mother   . Heart disease Mother   . Hypertension Mother   . Hyperlipidemia Mother   . Kidney disease Mother   . Hypertension Brother   . Stroke Maternal Grandmother   . Heart disease Maternal Grandfather   . Arthritis Paternal Grandmother     Objective: Office vital signs reviewed. BP 131/72   Pulse 64   Temp 99.2 F (37.3 C)   Ht 5\' 3"  (1.6 m)   Wt 110 lb 3.2 oz (50 kg)   SpO2 100%   BMI 19.52 kg/m   Physical Examination:  General: Awake, alert, well nourished, No acute distress HEENT: Normal, sclera white, MMM Cardio: regular rate and rhythm, S1S2 heard, no murmurs appreciated Pulm: clear to auscultation bilaterally, no wheezes, rhonchi or rales; normal work of breathing on room air Extremities: warm, well perfused, No edema, cyanosis or clubbing; +2 pulses bilaterally Psych: Mood somewhat labile.  Patient is intermittently tearful.  She is pleasant, interactive, good eye contact.  Thought process is linear  Depression screen [facility] 2/9 08/06/2019 08/06/2019 01/31/2019  Decreased Interest 0 0 0  Down, Depressed, Hopeless 0 0 0  PHQ - 2 Score 0 0 0  Altered sleeping 0 - 0  Tired, decreased energy 0 - 0  Change in appetite 0 - 0  Feeling bad or failure about yourself  0 - 0  Trouble concentrating 0 - 0  Moving slowly or fidgety/restless 0 - 0  Suicidal thoughts 0 - 0  PHQ-9 Score 0 - 0  Difficult doing work/chores - - Not difficult at all   Assessment/ Plan: 74 y.o. female   1. Anxiety We will  plan for mirtazapine at bedtime.  From what I could read this is an alternative to folks that have high risk for hyponatremia.  It is considered a moderate risk for hyponatremia versus high with SSRIs.  I would reach out to the pharmacist to get more information about this medication and risk for hyponatremia.  I am checking her sodium level as below.  I advised her to be careful with benzodiazepine so as to avoid any withdrawal symptoms or excessive sedation as this medication is associated  with increased risk of falls and adverse outcomes after a 64  2. Low serum sodium - Basic Metabolic Panel  3. Paroxysmal atrial fibrillation (HCC) Rate and rhythm controlled  4. Herpes zoster without complication Has follow-up with urology.  Rash has essentially resolved.  Declined any medications for postherpetic neuralgia today  5. Osteoporosis without current pathological fracture, unspecified osteoporosis type Plan for DEXA scan - DG [facility] DEXA; Future   No orders of the defined types were placed in this encounter.  No orders of the defined types were placed in this encounter.    Janora Norlander, DO [place] [phone]

## 2019-08-07 LAB — BASIC METABOLIC PANEL
BUN/Creatinine Ratio: 18 (ref 12–28)
BUN: 9 mg/dL (ref 8–27)
CO2: 25 mmol/L (ref 20–29)
Calcium: 9.1 mg/dL (ref 8.7–10.3)
Chloride: 89 mmol/L — ABNORMAL LOW (ref 96–106)
Creatinine, Ser: 0.51 mg/dL — ABNORMAL LOW (ref 0.57–1.00)
GFR calc Af Amer: 110 mL/min/{1.73_m2} (ref >59)
GFR calc non Af Amer: 95 mL/min/{1.73_m2} (ref >59)
Glucose: 100 mg/dL — ABNORMAL HIGH (ref 65–99)
Potassium: 3.9 mmol/L (ref 3.5–5.2)
Sodium: 134 mmol/L (ref 134–144)

## 2019-08-09 NOTE — Progress Notes (Signed)
Called patient no answer no VM.

## 2019-08-14 ENCOUNTER — Telehealth: Payer: Self-pay

## 2019-08-14 NOTE — Telephone Encounter (Signed)
LMTCB

## 2019-08-20 ENCOUNTER — Telehealth: Payer: Self-pay | Admitting: Family Medicine

## 2019-08-20 ENCOUNTER — Other Ambulatory Visit: Payer: Self-pay

## 2019-08-20 ENCOUNTER — Ambulatory Visit (INDEPENDENT_AMBULATORY_CARE_PROVIDER_SITE_OTHER): Payer: Medicare Other | Admitting: Family Medicine

## 2019-08-20 DIAGNOSIS — B0229 Other postherpetic nervous system involvement: Secondary | ICD-10-CM

## 2019-08-20 MED ORDER — GABAPENTIN 100 MG PO CAPS: ORAL_CAPSULE | ORAL | 3 refills | Status: DC

## 2019-08-20 NOTE — Progress Notes (Signed)
Telephone visit  Subjective: [id number] rash PCP: Janora Norlander, DO [id number] Karpowicz is a 74 y.o. female calls for telephone consult today. Patient provides verbal consent for consult held via phone.  Due to COVID-19 pandemic this visit was conducted virtually. This visit type was conducted due to national recommendations for restrictions regarding the COVID-19 Pandemic (e.g. social distancing, sheltering in place) in an effort to limit this patient's exposure and mitigate transmission in our community. All issues noted in this document were discussed and addressed.  A physical exam was not performed with this format.   Location of patient: home Location of provider: [facility] Others present for call: none  1. Shingles rash Patient with about a 5 to 6-week history of shingles rash that has affected her groin area.  She notes that the shingles vesicles have resolved but she has a persistent rash in this area.  She has follow-up with urology for evaluation of bladder soon.  She notes a stinging, burning sensation at the rash site.  She has been applying topical cortisone cream without much improvement.  She is wondering if there is an alternative topical or oral that may better help with the use symptoms as she has difficulty sitting comfortably.   ROS: Per HPI  Allergies  Allergen Reactions  . Fosamax [Alendronate Sodium]     Dizziness, elevated blood pressure  . Sertraline Nausea Only    Insomnia, did not feel well  . Sulfa Antibiotics Nausea Only   Past Medical History:  Diagnosis Date  . Anxiety   . Arthritis   . Hypertension   . Irregular heart beat     Current Outpatient Medications:  .  amLODipine (NORVASC) 5 MG tablet, Take 1 tablet (5 mg total) by mouth daily. (Needs to be seen before next refill), Disp: 90 tablet, Rfl: 0 .  aspirin 325 MG tablet, Take 325 mg by mouth daily., Disp: , Rfl:  .  Cholecalciferol (VITAMIN D3) 125 MCG (5000 UT) CAPS, Take 5,000 Units by  mouth., Disp: , Rfl:  .  clotrimazole-betamethasone (LOTRISONE) cream, Apply 1 application topically 2 (two) times daily., Disp: 30 g, Rfl: 0 .  flecainide (TAMBOCOR) 100 MG tablet, Take 100 mg by mouth 2 (two) times daily. , Disp: , Rfl:  .  irbesartan (AVAPRO) 300 MG tablet, TAKE 1 TABLET BY MOUTH  DAILY, Disp: 90 tablet, Rfl: 3 .  metoprolol succinate (TOPROL-XL) 50 MG 24 hr tablet, Take 50 mg by mouth daily. , Disp: , Rfl:  .  Multiple Vitamins-Minerals (MULTIVITAMIN WITH MINERALS) tablet, Take 1 tablet by mouth daily., Disp: , Rfl:  .  pravastatin (PRAVACHOL) 20 MG tablet, TAKE 1 TABLET BY MOUTH  DAILY, Disp: 90 tablet, Rfl: 3  Assessment/ Plan: 74 y.o. female   1. Post herpetic neuralgia Apply capsaicin to the affected areas.  I would probably discontinue use of topical corticosteroid since this is been essentially ineffective and likely is causing localized site reactions this far out.  Trial of gabapentin.  We discussed starting with 1 capsule nightly and increasing to twice daily if 100 mg is adequate in treatment.  Alternatively, we could increase to 300 mg nightly.  Caution sedation.  Patient will follow up as needed this issue. - gabapentin (NEURONTIN) 100 MG capsule; Take 1 capsule (100 mg total) by mouth at bedtime for 3 days, THEN 1 capsule (100 mg total) 2 (two) times daily for 3 days, THEN 1 capsule (100 mg total) 3 (three) times daily.  Dispense: 90 capsule; Refill:  3   Start time: 1:08pm End time: 1:15pm  Total time spent on patient care (including telephone call/ virtual visit): 15 minutes  August, [place] [phone]

## 2019-08-20 NOTE — Patient Instructions (Addendum)
Capsaicin is Over the counter and can be applied topically.  I have added gabapentin to start at night time for the neuropathy.   Postherpetic Neuralgia Postherpetic neuralgia (PHN) is nerve pain that occurs after a shingles infection. Shingles is a painful rash that appears on one area of the body, usually on the trunk or face. Shingles is caused by the varicella-zoster virus. This is the same virus that causes chickenpox. In people who have had chickenpox, the virus can resurface years later and cause shingles. You may have PHN if you continue to have pain for 4 months after your shingles rash has gone away. PHN appears in the same area where you had the shingles rash. The pain usually goes away after the rash disappears. Getting a vaccination for shingles can prevent PHN. This vaccine is recommended for people older than 60. It may prevent shingles, and may also lower your risk of PHN if you do get shingles. What are the causes? This condition is caused by damage to your nerves from the varicella-zoster virus. The damage makes your nerves overly sensitive. What increases the risk? The following factors may make you more likely to develop this condition:  Being older than 75 years of age.  Having severe pain before your shingles rash starts.  Having a severe rash.  Having shingles in and around the eye area.  Having a disease that makes your body unable to fight infections (weak immune system). What are the signs or symptoms? The main symptom of this condition is pain. The pain may:  Often be very bad and may be described as stabbing, burning, or feeling like an electric shock.  Come and go or may be there all the time.  Be triggered by light touches on the skin or changes in temperature. You may have itching along with the pain. How is this diagnosed? This condition may be diagnosed based on your symptoms and your history of shingles. Lab studies and other diagnostic tests are  usually not needed. How is this treated? There is no cure for this condition. Treatment for PHN will focus on pain relief. Over-the-counter pain relievers do not usually relieve PHN pain. You may need to work with a pain specialist. Treatment may include:  Antidepressant medicines to help with pain and improve sleep.  Anti-seizure medicines to relieve nerve pain.  Strong pain relievers (opioids).  A numbing patch worn on the skin (lidocaine patch).  Botox (botulinum toxin) injections to block pain signals between nerves and muscles.  Injections of numbing medicine or anti-inflammatory medicines around irritated nerves. Follow these instructions at home:   It may take a long time to recover from PHN. Work closely with your health care provider and develop a good support system at home.  Take over-the-counter and prescription medicines only as told by your health care provider.  Do not drive or use heavy machinery while taking prescription pain medicine.  Wear loose, comfortable clothing.  Cover sensitive areas with a dressing to reduce friction from clothing rubbing on the area.  If directed, put ice on the painful area: ? Put ice in a plastic bag. ? Place a towel between your skin and the bag. ? Leave the ice on for 20 minutes, 2-3 times a day.  Talk to your health care provider if you feel depressed or desperate. Living with long-term pain can be depressing.  Keep all follow-up visits as told by your health care provider. This is important. Contact a health care provider if:  Your medicine is not helping.  You are struggling to manage your pain at home. Summary  Postherpetic neuralgia is a very painful disorder that can occur after an episode of shingles.  The pain is often severe, burning, electric, or stabbing.  Prescription medicines can be helpful in managing persistent pain.  Getting a vaccination for shingles can prevent PHN. This vaccine is recommended for  people older than 60. This information is not intended to replace advice given to you by your health care provider. Make sure you discuss any questions you have with your health care provider. Document Revised: 03/25/2017 Document Reviewed: 06/29/2016 Elsevier Patient Education  [place].

## 2019-08-31 DIAGNOSIS — R3914 Feeling of incomplete bladder emptying: Secondary | ICD-10-CM | POA: Diagnosis not present

## 2019-08-31 DIAGNOSIS — B027 Disseminated zoster: Secondary | ICD-10-CM | POA: Diagnosis not present

## 2019-08-31 DIAGNOSIS — R311 Benign essential microscopic hematuria: Secondary | ICD-10-CM | POA: Diagnosis not present

## 2019-09-12 ENCOUNTER — Other Ambulatory Visit: Payer: Self-pay | Admitting: *Deleted

## 2019-09-12 DIAGNOSIS — I10 Essential (primary) hypertension: Secondary | ICD-10-CM

## 2019-09-12 MED ORDER — AMLODIPINE BESYLATE 5 MG PO TABS: 5.0000 mg | ORAL_TABLET | Freq: Every day | ORAL | 0 refills | Status: DC

## 2019-10-28 ENCOUNTER — Other Ambulatory Visit: Payer: Self-pay | Admitting: Family Medicine

## 2019-10-28 DIAGNOSIS — I10 Essential (primary) hypertension: Secondary | ICD-10-CM

## 2019-11-15 ENCOUNTER — Other Ambulatory Visit: Payer: Self-pay

## 2019-11-15 ENCOUNTER — Ambulatory Visit (INDEPENDENT_AMBULATORY_CARE_PROVIDER_SITE_OTHER): Payer: Medicare Other | Admitting: Nurse Practitioner

## 2019-11-15 ENCOUNTER — Encounter: Payer: Self-pay | Admitting: Nurse Practitioner

## 2019-11-15 ENCOUNTER — Ambulatory Visit: Payer: Medicare Other | Admitting: Nurse Practitioner

## 2019-11-15 VITALS — BP 136/70 | HR 65 | Temp 97.8°F | Resp 20 | Ht 63.0 in | Wt 109.0 lb

## 2019-11-15 DIAGNOSIS — L0291 Cutaneous abscess, unspecified: Secondary | ICD-10-CM

## 2019-11-15 DIAGNOSIS — I480 Paroxysmal atrial fibrillation: Secondary | ICD-10-CM | POA: Diagnosis not present

## 2019-11-15 DIAGNOSIS — I10 Essential (primary) hypertension: Secondary | ICD-10-CM | POA: Diagnosis not present

## 2019-11-15 MED ORDER — AMOXICILLIN-POT CLAVULANATE 875-125 MG PO TABS: 1.0000 | ORAL_TABLET | Freq: Two times a day (BID) | ORAL | 0 refills | Status: DC

## 2019-11-15 NOTE — Assessment & Plan Note (Signed)
Patient is a 74 year old female who presents to clinic with abscess in right inner thigh vaginal area.  She noticed abscess 7 days ago.  This is new for patient and not well managed.  Patient is reporting increased drainage and pus, redness, itching and pain.  Patient denies fever nausea body ache and fatigue.  Patient has not used anything to improve symptoms but has kept the area clean using hydrogen peroxide. Wound cultures collected. Started patient on Augmentin twice daily. Tylenol as needed for pain Advised patient to keep area clean, warm compress.  And hand hygiene. Education provided with printed handouts given to patient. Rx sent to pharmacy Patient follow-up as needed with worsening or uncontrolled symptoms.

## 2019-11-15 NOTE — Patient Instructions (Addendum)
Mary [name] Patient is a 74 year old female who presents to clinic with Mary [name] in right inner thigh vaginal area.  She noticed Mary [name] 7 days ago.  This is new for patient and not well managed.  Patient is reporting increased drainage and pus, redness, itching and pain.  Patient denies fever nausea body ache and fatigue.  Patient has not used anything to improve symptoms but has kept the area clean using hydrogen peroxide. Wound cultures collected. Started patient on Augmentin twice daily. Tylenol as needed for pain Advised patient to keep area clean, warm compress.  And hand hygiene. Education provided with printed handouts given to patient. Rx sent to pharmacy Patient follow-up as needed with worsening or uncontrolled symptoms.   Skin Mary [name]  A skin Mary [name] is an infected area on or under your skin that contains a collection of pus and other material. An Mary [name] may also be called a furuncle, carbuncle, or boil. An Mary [name] can occur in or on almost any part of your body. Some abscesses break open (rupture) on their own. Most continue to get worse unless they are treated. The infection can spread deeper into the body and eventually into your blood, which can make you feel ill. Treatment usually involves draining the Mary [name]. What are the causes? An Mary [name] occurs when germs, like bacteria, pass through your skin and cause an infection. This may be caused by:  A scrape or cut on your skin.  A puncture wound through your skin, including a needle injection or insect bite.  Blocked oil or sweat glands.  Blocked and infected hair follicles.  A cyst that forms beneath your skin (sebaceous cyst) and becomes infected. What increases the risk? This condition is more likely to develop in people who:  Have a weak body defense system (immune system).  Have diabetes.  Have dry and irritated skin.  Get frequent injections or use illegal IV drugs.  Have a foreign body in a wound, such as a  splinter.  Have problems with their lymph system or veins. What are the signs or symptoms? Symptoms of this condition include:  A painful, firm bump under the skin.  A bump with pus at the top. This may break through the skin and drain. Other symptoms include:  Redness surrounding the Mary [name] site.  Warmth.  Swelling of the lymph nodes (glands) near the Mary [name].  Tenderness.  A sore on the skin. How is this diagnosed? This condition may be diagnosed based on:  A physical exam.  Your medical history.  A sample of pus. This may be used to find out what is causing the infection.  Blood tests.  Imaging tests, such as an ultrasound, CT scan, or MRI. How is this treated? A small Mary [name] that drains on its own may not need treatment. Treatment for larger abscesses may include:  Moist heat or heat pack applied to the area several times a day.  A procedure to drain the Mary [name] (incision and drainage).  Antibiotic medicines. For a severe Mary [name], you may first get antibiotics through an IV and then change to antibiotics by mouth. Follow these instructions at home: Medicines   Take over-the-counter and prescription medicines only as told by your health care provider.  If you were prescribed an antibiotic medicine, take it as told by your health care provider. Do not stop taking the antibiotic even if you start to feel better. Mary [name] care   If you have an Mary [name] that has not drained, apply heat to the affected area. Use  the heat source that your health care provider recommends, such as a moist heat pack or a heating pad. ? Place a towel between your skin and the heat source. ? Leave the heat on for 20-30 minutes. ? Remove the heat if your skin turns bright red. This is especially important if you are unable to feel pain, heat, or cold. You may have a greater risk of getting burned.  Follow instructions from your health care provider about how to take care of your Mary [name].  Make sure you: ? Cover the Mary [name] with a bandage (dressing). ? Change your dressing or gauze as told by your health care provider. ? Wash your hands with soap and water before you change the dressing or gauze. If soap and water are not available, use hand sanitizer.  Check your Mary [name] every day for signs of a worsening infection. Check for: ? More redness, swelling, or pain. ? More fluid or blood. ? Warmth. ? More pus or a bad smell. General instructions  To avoid spreading the infection: ? Do not share personal care items, towels, or hot tubs with others. ? Avoid making skin contact with other people.  Keep all follow-up visits as told by your health care provider. This is important. Contact a health care provider if you have:  More redness, swelling, or pain around your Mary [name].  More fluid or blood coming from your Mary [name].  Warm skin around your Mary [name].  More pus or a bad smell coming from your Mary [name].  A fever.  Muscle aches.  Chills or a general ill feeling. Get help right away if you:  Have severe pain.  See red streaks on your skin spreading away from the Mary [name]. Summary  A skin Mary [name] is an infected area on or under your skin that contains a collection of pus and other material.  A small Mary [name] that drains on its own may not need treatment.  Treatment for larger abscesses may include having a procedure to drain the Mary [name] and taking an antibiotic. This information is not intended to replace advice given to you by your health care provider. Make sure you discuss any questions you have with your health care provider. Document Revised: 08/03/2018 Document Reviewed: 05/26/2017 Elsevier Patient Education  2020 [employer].

## 2019-11-15 NOTE — Progress Notes (Signed)
Acute Office Visit  Subjective:    Patient ID: Mary [name], female    DOB: Oct 24, 1945, 74 y.o.   MRN: [id number]  Chief Complaint  Patient presents with   Cyst    vaginal area - x 1.5 weeks     HPI Patient is in today for abscess Right inner thigh vaginal area.  Patient noticed abscess 7 days ago.  This is new for patient.  Patient is reporting increased drainage and pus, redness, itching and pain.  Patient denies fever, nausea, body ache and fatigue.  Patient has used nothing to improve symptoms but has kept area clean using hydrogen peroxide.  Past Medical History:  Diagnosis Date   Anxiety    Arthritis    Hypertension    Irregular heart beat     Past Surgical History:  Procedure Laterality Date   ABDOMINAL HYSTERECTOMY     KNEE ARTHROSCOPY     TONSILLECTOMY      Family History  Problem Relation Age of Onset   Arthritis Mother    Heart disease Mother    Hypertension Mother    Hyperlipidemia Mother    Kidney disease Mother    Hypertension Brother    Stroke Maternal Grandmother    Heart disease Maternal Grandfather    Arthritis Paternal Grandmother     Social History   Socioeconomic History   Marital status: Widowed    Spouse name: Not on file   Number of children: 3   Years of education: 10   Highest education level: 12th grade  Occupational History   Occupation: retired  Tobacco Use   Smoking status: Never Smoker   Smokeless tobacco: Never Used  Scientific laboratory technician Use: Never used  Substance and Sexual Activity   Alcohol use: Yes    Alcohol/week: 7.0 standard drinks    Types: 7 Glasses of wine per week   Drug use: No   Sexual activity: Not Currently  Other Topics Concern   Not on file  Social History Narrative   Not on file   Social Determinants of Health   Financial Resource Strain:    Difficulty of Paying Living Expenses:   Food Insecurity:    Worried About Charity fundraiser in the Last Year:     Arboriculturist in the Last Year:   Transportation Needs:    Film/video editor (Medical):    Lack of Transportation (Non-Medical):   Physical Activity: Unknown   Days of Exercise per Week: Not on file   Minutes of Exercise per Session: 50 min  Stress:    Feeling of Stress :   Social Connections:    Frequency of Communication with Friends and Family:    Frequency of Social Gatherings with Friends and Family:    Attends Religious Services:    Active Member of Clubs or Organizations:    Attends Music therapist:    Marital Status:   Intimate Partner Violence:    Fear of Current or Ex-Partner:    Emotionally Abused:    Physically Abused:    Sexually Abused:     Outpatient Medications Prior to Visit  Medication Sig Dispense Refill   amLODipine (NORVASC) 5 MG tablet TAKE 1 TABLET BY MOUTH  DAILY 90 tablet 0   aspirin 325 MG tablet Take 325 mg by mouth daily.     Cholecalciferol (VITAMIN D3) 125 MCG (5000 UT) CAPS Take 5,000 Units by mouth.     clotrimazole-betamethasone (LOTRISONE) cream Apply  1 application topically 2 (two) times daily. 30 g 0   flecainide (TAMBOCOR) 100 MG tablet Take 100 mg by mouth 2 (two) times daily.      irbesartan (AVAPRO) 300 MG tablet TAKE 1 TABLET BY MOUTH  DAILY 90 tablet 3   metoprolol succinate (TOPROL-XL) 50 MG 24 hr tablet Take 50 mg by mouth daily.      Multiple Vitamins-Minerals (MULTIVITAMIN WITH MINERALS) tablet Take 1 tablet by mouth daily.     pravastatin (PRAVACHOL) 20 MG tablet TAKE 1 TABLET BY MOUTH  DAILY 90 tablet 3   gabapentin (NEURONTIN) 100 MG capsule Take 1 capsule (100 mg total) by mouth at bedtime for 3 days, THEN 1 capsule (100 mg total) 2 (two) times daily for 3 days, THEN 1 capsule (100 mg total) 3 (three) times daily. 90 capsule 3   No facility-administered medications prior to visit.    Allergies  Allergen Reactions   Fosamax [Alendronate Sodium]     Dizziness, elevated blood  pressure   Sertraline Nausea Only    Insomnia, did not feel well   Sulfa Antibiotics Nausea Only    Review of Systems  Constitutional: Negative.   HENT: Negative.   Eyes: Negative.   Respiratory: Negative.   Cardiovascular: Negative.   Gastrointestinal: Negative.   Genitourinary: Negative.   Musculoskeletal: Negative.   Skin: Positive for color change.       Boil       Objective:    Physical Exam Vitals reviewed.  Constitutional:      Appearance: Normal appearance.  HENT:     Head: Normocephalic.  Eyes:     Conjunctiva/sclera: Conjunctivae normal.  Cardiovascular:     Rate and Rhythm: Normal rate and regular rhythm.     Pulses: Normal pulses.     Heart sounds: Normal heart sounds.  Pulmonary:     Effort: Pulmonary effort is normal.     Breath sounds: Normal breath sounds.  Abdominal:     General: Bowel sounds are normal.  Genitourinary:    General: Normal vulva.     Exam position: Supine.     Labia:        Right: No tenderness.        Left: No tenderness.      Vagina: No vaginal discharge.       Comments: Boil with pus Neurological:     Mental Status: She is alert and oriented to person, place, and time.     BP (!) 136/70    Pulse 65    Temp 97.8 F (36.6 C)    Resp 20    Ht 5\' 3"  (1.6 m)    Wt 109 lb (49.4 kg)    SpO2 96%    BMI 19.31 kg/m  Wt Readings from Last 3 Encounters:  11/15/19 109 lb (49.4 kg)  08/06/19 110 lb 3.2 oz (50 kg)  07/08/19 110 lb (49.9 kg)    Health Maintenance Due  Topic Date Due   COVID-19 Vaccine (1) Never done   DEXA SCAN  05/20/2019      Lab Results  Component Value Date   TSH 3.140 07/06/2018   Lab Results  Component Value Date   WBC 7.0 01/31/2019   HGB 12.1 01/31/2019   HCT 34.7 01/31/2019   MCV 90 01/31/2019   PLT 262 01/31/2019   Lab Results  Component Value Date   NA 134 08/06/2019   K 3.9 08/06/2019   CO2 25 08/06/2019   GLUCOSE 100 (H)  08/06/2019   BUN 9 08/06/2019   CREATININE 0.51 (L)  08/06/2019   BILITOT 0.3 03/01/2019   ALKPHOS 67 03/01/2019   AST 19 03/01/2019   ALT 11 03/01/2019   PROT 6.6 03/01/2019   ALBUMIN 4.7 03/01/2019   CALCIUM 9.1 08/06/2019   Lab Results  Component Value Date   CHOL 164 05/25/2018   Lab Results  Component Value Date   HDL 107 05/25/2018   Lab Results  Component Value Date   LDLCALC 48 05/25/2018   Lab Results  Component Value Date   TRIG 45 05/25/2018   Lab Results  Component Value Date   CHOLHDL 1.5 05/25/2018   No results found for: HGBA1C     Assessment & Plan:   Problem List Items Addressed This Visit      Other   Abscess - Primary    Patient is a 74 year old female who presents to clinic with abscess in right inner thigh vaginal area.  She noticed abscess 7 days ago.  This is new for patient and not well managed.  Patient is reporting increased drainage and pus, redness, itching and pain.  Patient denies fever nausea body ache and fatigue.  Patient has not used anything to improve symptoms but has kept the area clean using hydrogen peroxide. Wound cultures collected. Started patient on Augmentin twice daily. Tylenol as needed for pain Advised patient to keep area clean, warm compress.  And hand hygiene. Education provided with printed handouts given to patient. Rx sent to pharmacy Patient follow-up as needed with worsening or uncontrolled symptoms.      Relevant Medications   amoxicillin-clavulanate (AUGMENTIN) 875-125 MG tablet   Other Relevant Orders   Anaerobic and Aerobic Culture       Meds ordered this encounter  Medications   amoxicillin-clavulanate (AUGMENTIN) 875-125 MG tablet    Sig: Take 1 tablet by mouth 2 (two) times daily.    Dispense:  20 tablet    Refill:  0    Order Specific Question:   Supervising Provider    Answer:   Caryl Pina A [[id number]]     Ivy Lynn, NP

## 2019-11-19 LAB — ANAEROBIC AND AEROBIC CULTURE

## 2019-12-05 ENCOUNTER — Ambulatory Visit: Payer: Medicare Other

## 2019-12-06 DIAGNOSIS — R3914 Feeling of incomplete bladder emptying: Secondary | ICD-10-CM | POA: Diagnosis not present

## 2019-12-06 DIAGNOSIS — R311 Benign essential microscopic hematuria: Secondary | ICD-10-CM | POA: Diagnosis not present

## 2020-01-21 ENCOUNTER — Other Ambulatory Visit: Payer: Self-pay | Admitting: Family Medicine

## 2020-01-21 DIAGNOSIS — I10 Essential (primary) hypertension: Secondary | ICD-10-CM

## 2020-02-05 ENCOUNTER — Ambulatory Visit: Payer: Medicare Other

## 2020-02-05 ENCOUNTER — Other Ambulatory Visit: Payer: Self-pay

## 2020-02-05 ENCOUNTER — Ambulatory Visit (INDEPENDENT_AMBULATORY_CARE_PROVIDER_SITE_OTHER): Payer: Medicare Other | Admitting: Family Medicine

## 2020-02-05 ENCOUNTER — Encounter: Payer: Self-pay | Admitting: Family Medicine

## 2020-02-05 VITALS — BP 159/74 | HR 63 | Temp 97.9°F | Ht 63.0 in | Wt 109.0 lb

## 2020-02-05 DIAGNOSIS — F410 Panic disorder [episodic paroxysmal anxiety] without agoraphobia: Secondary | ICD-10-CM | POA: Diagnosis not present

## 2020-02-05 DIAGNOSIS — F439 Reaction to severe stress, unspecified: Secondary | ICD-10-CM

## 2020-02-05 DIAGNOSIS — Z23 Encounter for immunization: Secondary | ICD-10-CM | POA: Diagnosis not present

## 2020-02-05 MED ORDER — LORAZEPAM 0.5 MG PO TABS: 0.5000 mg | ORAL_TABLET | Freq: Two times a day (BID) | ORAL | 1 refills | Status: DC | PRN

## 2020-02-05 NOTE — Patient Instructions (Signed)
Controlled Substance Guidelines:  1. You cannot get an early refill, even it is lost.  2. You cannot get controlled medications from any other doctor, unless it is the emergency department and related to a new problem or injury.  3. You cannot use alcohol, marijuana, cocaine or any other recreational drugs while using this medication. This is very dangerous.  4. You are willing to have your urine drug tested at each visit.  5. You will not drive while using this medication, because that can put yourself and others in serious danger of an accident. 6. If any medication is stolen, then there must be a police report to verify it, or it cannot be refilled.  7. I will not prescribe these medications for longer than 3 months.  8. You must bring your pill bottle to each visit.  9. You must use the same pharmacy for all refills for the medication, unless you clear it with me beforehand.  10. You cannot share or sell this medication.   

## 2020-02-05 NOTE — Progress Notes (Signed)
Subjective: CC: Follow-up mood PCP: Mary Norlander, DO Mary [name] is a 74 y.o. female presenting to clinic today for:  1.  Stress Patient reports increasing stress.  She spent several thousand dollars on tree removal recently.  She has been really working to try and maintain her home.  She reports sensation of feeling panicked with tightness in her chest.  Sometimes this makes her feel like she might be having a heart attack but she knows that it is panic.  She has got some leftover Xanax that she has been using intermittently.  She felt most stable on Lexapro but unfortunately this caused quite a bit of sodium issues.  She is also failed sertraline.  She would like to get back on something similar to these medicines but worries about her sodium going down again.   ROS: Per HPI  Allergies  Allergen Reactions  . Fosamax [Alendronate Sodium]     Dizziness, elevated blood pressure  . Sertraline Nausea Only    Insomnia, did not feel well  . Sulfa Antibiotics Nausea Only   Past Medical History:  Diagnosis Date  . Anxiety   . Arthritis   . Hypertension   . Irregular heart beat     Current Outpatient Medications:  .  amLODipine (NORVASC) 5 MG tablet, TAKE 1 TABLET BY MOUTH  DAILY, Disp: 90 tablet, Rfl: 0 .  amoxicillin-clavulanate (AUGMENTIN) 875-125 MG tablet, Take 1 tablet by mouth 2 (two) times daily., Disp: 20 tablet, Rfl: 0 .  aspirin 325 MG tablet, Take 325 mg by mouth daily., Disp: , Rfl:  .  Cholecalciferol (VITAMIN D3) 125 MCG (5000 UT) CAPS, Take 5,000 Units by mouth., Disp: , Rfl:  .  clotrimazole-betamethasone (LOTRISONE) cream, Apply 1 application topically 2 (two) times daily., Disp: 30 g, Rfl: 0 .  flecainide (TAMBOCOR) 100 MG tablet, Take 100 mg by mouth 2 (two) times daily. , Disp: , Rfl:  .  irbesartan (AVAPRO) 300 MG tablet, TAKE 1 TABLET BY MOUTH  DAILY, Disp: 90 tablet, Rfl: 3 .  metoprolol succinate (TOPROL-XL) 50 MG 24 hr tablet, Take 50 mg by  mouth daily. , Disp: , Rfl:  .  Multiple Vitamins-Minerals (MULTIVITAMIN WITH MINERALS) tablet, Take 1 tablet by mouth daily., Disp: , Rfl:  .  pravastatin (PRAVACHOL) 20 MG tablet, TAKE 1 TABLET BY MOUTH  DAILY, Disp: 90 tablet, Rfl: 3 .  LORazepam (ATIVAN) 0.5 MG tablet, Take 1 tablet (0.5 mg total) by mouth 2 (two) times daily as needed for anxiety., Disp: 30 tablet, Rfl: 1 Social History   Socioeconomic History  . Marital status: Widowed    Spouse name: Not on file  . Number of children: 3  . Years of education: 64  . Highest education level: 12th grade  Occupational History  . Occupation: retired  Tobacco Use  . Smoking status: Never Smoker  . Smokeless tobacco: Never Used  Vaping Use  . Vaping Use: Never used  Substance and Sexual Activity  . Alcohol use: Yes    Alcohol/week: 7.0 standard drinks    Types: 7 Glasses of wine per week  . Drug use: No  . Sexual activity: Not Currently  Other Topics Concern  . Not on file  Social History Narrative  . Not on file   Social Determinants of Health   Financial Resource Strain:   . Difficulty of Paying Living Expenses: Not on file  Food Insecurity:   . Worried About Charity fundraiser in the Last Year:  Not on file  . Ran Out of Food in the Last Year: Not on file  Transportation Needs:   . Lack of Transportation (Medical): Not on file  . Lack of Transportation (Non-Medical): Not on file  Physical Activity:   . Days of Exercise per Week: Not on file  . Minutes of Exercise per Session: Not on file  Stress:   . Feeling of Stress : Not on file  Social Connections:   . Frequency of Communication with Friends and Family: Not on file  . Frequency of Social Gatherings with Friends and Family: Not on file  . Attends Religious Services: Not on file  . Active Member of Clubs or Organizations: Not on file  . Attends Archivist Meetings: Not on file  . Marital Status: Not on file  Intimate Partner Violence:   . Fear of  Current or Ex-Partner: Not on file  . Emotionally Abused: Not on file  . Physically Abused: Not on file  . Sexually Abused: Not on file   Family History  Problem Relation Age of Onset  . Arthritis Mother   . Heart disease Mother   . Hypertension Mother   . Hyperlipidemia Mother   . Kidney disease Mother   . Hypertension Brother   . Stroke Maternal Grandmother   . Heart disease Maternal Grandfather   . Arthritis Paternal Grandmother     Objective: Office vital signs reviewed. BP (!) 159/74   Pulse 63   Temp 97.9 F (36.6 C)   Ht 5\' 3"  (1.6 m)   Wt 109 lb (49.4 kg)   SpO2 99%   BMI 19.31 kg/m   Physical Examination:  General: Awake, alert, well nourished, No acute distress HEENT: Normal; no exophthalmos.  No thyromegaly Cardio: regular rate and rhythm, S1S2 heard, no murmurs appreciated Pulm: clear to auscultation bilaterally, no wheezes, rhonchi or rales; normal work of breathing on room air Extremities: warm, well perfused, No edema, cyanosis or clubbing; +2 pulses bilaterally Psych: Appears anxious.  Mood somewhat depressed.  Good eye contact.  Does not appear to be responding to internal stimuli  Depression screen [facility] 2/9 02/05/2020 02/05/2020 11/15/2019  Decreased Interest 0 0 0  Down, Depressed, Hopeless 1 0 0  PHQ - 2 Score 1 0 0  Altered sleeping 0 - -  Tired, decreased energy 0 - -  Change in appetite 0 - -  Feeling bad or failure about yourself  1 - -  Trouble concentrating 0 - -  Moving slowly or fidgety/restless 0 - -  Suicidal thoughts 0 - -  PHQ-9 Score 2 - -  Difficult doing work/chores Not difficult at all - -  Some recent data might be hidden   GAD 7 : Generalized Anxiety Score 02/05/2020 01/31/2019 12/13/2018 10/31/2018  Nervous, Anxious, on Edge 2 0 1 1  Control/stop worrying 2 0 1 1  Worry too much - different things 2 0 1 1  Trouble relaxing 2 0 0 0  Restless 0 0 0 0  Easily annoyed or irritable 0 0 0 0  Afraid - awful might happen 0 0 0 0    Total GAD 7 Score 8 0 3 3  Anxiety Difficulty Not difficult at all Not difficult at all - -   Assessment/ Plan: 73 y.o. female   1. Stress I do think that she is having both anxiety and depressive manifestations but unfortunately she has had severe hyponatremia in the setting of SSRI use.  Lexapro from a  mood standpoint was the most beneficial but again caused hyponatremia.  I have discussed her case with our pharmacist and she is going to do a little bit more research on what medicines may be an option for this patient.  In the interim, I have given her a small quantity of Ativan to have on hand.  We discussed sparing use of this and the potential for dependency on this medicine.  She understands the risks and will use sparingly.  If ongoing need for this we will need to collect a UDS and CSC as per office policy.  Additionally, offered referral to counseling services but she declined this today. - LORazepam (ATIVAN) 0.5 MG tablet; Take 1 tablet (0.5 mg total) by mouth 2 (two) times daily as needed for anxiety.  Dispense: 30 tablet; Refill: 1  2. Panic attack - LORazepam (ATIVAN) 0.5 MG tablet; Take 1 tablet (0.5 mg total) by mouth 2 (two) times daily as needed for anxiety.  Dispense: 30 tablet; Refill: 1  3. Need for immunization against influenza Administered - Flu Vaccine QUAD High Dose(Fluad)  The Narcotic Database has been reviewed.  There were no red flags.     Orders Placed This Encounter  Procedures  . Flu Vaccine QUAD High Dose(Fluad)   Meds ordered this encounter  Medications  . LORazepam (ATIVAN) 0.5 MG tablet    Sig: Take 1 tablet (0.5 mg total) by mouth 2 (two) times daily as needed for anxiety.    Dispense:  30 tablet    Refill:  1   Total time spent with patient 30.  Greater than 50% of encounter spent in coordination of care/counseling.   Mary Norlander, DO [place] [phone]

## 2020-02-06 ENCOUNTER — Other Ambulatory Visit: Payer: Self-pay | Admitting: Family Medicine

## 2020-02-06 ENCOUNTER — Other Ambulatory Visit: Payer: Self-pay | Admitting: *Deleted

## 2020-02-06 DIAGNOSIS — Z1231 Encounter for screening mammogram for malignant neoplasm of breast: Secondary | ICD-10-CM

## 2020-02-06 DIAGNOSIS — I10 Essential (primary) hypertension: Secondary | ICD-10-CM

## 2020-02-06 DIAGNOSIS — E785 Hyperlipidemia, unspecified: Secondary | ICD-10-CM

## 2020-02-06 MED ORDER — IRBESARTAN 300 MG PO TABS: 300.0000 mg | ORAL_TABLET | Freq: Every day | ORAL | 3 refills | Status: DC

## 2020-02-06 MED ORDER — PRAVASTATIN SODIUM 20 MG PO TABS: 20.0000 mg | ORAL_TABLET | Freq: Every day | ORAL | 3 refills | Status: DC

## 2020-03-04 ENCOUNTER — Ambulatory Visit
Admission: RE | Admit: 2020-03-04 | Discharge: 2020-03-04 | Disposition: A | Discharge status: Home / Self Care | Payer: Medicare Other | Source: Ambulatory Visit | Attending: Family Medicine | Admitting: Family Medicine

## 2020-03-04 ENCOUNTER — Other Ambulatory Visit: Payer: Self-pay

## 2020-03-04 DIAGNOSIS — Z1231 Encounter for screening mammogram for malignant neoplasm of breast: Secondary | ICD-10-CM | POA: Diagnosis not present

## 2020-04-15 ENCOUNTER — Other Ambulatory Visit: Payer: Self-pay | Admitting: Family Medicine

## 2020-04-15 DIAGNOSIS — I10 Essential (primary) hypertension: Secondary | ICD-10-CM

## 2020-05-07 ENCOUNTER — Ambulatory Visit (INDEPENDENT_AMBULATORY_CARE_PROVIDER_SITE_OTHER): Payer: Medicare Other

## 2020-05-07 ENCOUNTER — Other Ambulatory Visit: Payer: Self-pay

## 2020-05-07 ENCOUNTER — Ambulatory Visit (INDEPENDENT_AMBULATORY_CARE_PROVIDER_SITE_OTHER): Payer: Medicare Other | Admitting: Family Medicine

## 2020-05-07 VITALS — BP 138/66 | HR 60 | Temp 98.0°F | Ht 63.0 in | Wt 110.0 lb

## 2020-05-07 DIAGNOSIS — M810 Age-related osteoporosis without current pathological fracture: Secondary | ICD-10-CM

## 2020-05-07 DIAGNOSIS — F410 Panic disorder [episodic paroxysmal anxiety] without agoraphobia: Secondary | ICD-10-CM | POA: Diagnosis not present

## 2020-05-07 DIAGNOSIS — I480 Paroxysmal atrial fibrillation: Secondary | ICD-10-CM | POA: Diagnosis not present

## 2020-05-07 DIAGNOSIS — F439 Reaction to severe stress, unspecified: Secondary | ICD-10-CM | POA: Diagnosis not present

## 2020-05-07 NOTE — Progress Notes (Signed)
Subjective: CC: Follow-up anxiety and depression PCP: Janora Norlander, DO FXJ:[id number] Mary [name] is a 75 y.o. female presenting to clinic today for:  1.  Anxiety and depression Patient never did start the Ativan because she did not feel that it was needed.  She does admit some increased depressive symptoms since the holidays.  There was a palpable absence of her spouse.  His birthday was this month as well.  She does feel that she is coping independently.  She is not ready to be on any medications but would like the name of the mirtazapine written down so that she can consider this if symptoms are not significantly improving come spring.  2. atrial fibrillation Patient reports compliance with her Tambocor, metoprolol.  No reports of heart racing, palpitations.  3. osteoporosis In 2019 her DEXA showed evidence of osteoporosis.  She is due for repeat today.  We tried to put her on Fosamax but she experienced feeling ill off of that medicine and therefore that was discontinued.  We will consider Prolia but unfortunately this was unaffordable at over $200 per treatment.  She tries to stay physically active but does not do anything specific for weightbearing exercise.  She does take vitamin D and calcium and tries to keep these balanced in her diet as well.  ROS: Per HPI  Allergies  Allergen Reactions  . Fosamax [Alendronate Sodium]     Dizziness, elevated blood pressure  . Sertraline Nausea Only    Insomnia, did not feel well  . Sulfa Antibiotics Nausea Only   Past Medical History:  Diagnosis Date  . Anxiety   . Arthritis   . Hypertension   . Irregular heart beat     Current Outpatient Medications:  .  amLODipine (NORVASC) 5 MG tablet, TAKE 1 TABLET BY MOUTH  DAILY, Disp: 90 tablet, Rfl: 1 .  amoxicillin-clavulanate (AUGMENTIN) 875-125 MG tablet, Take 1 tablet by mouth 2 (two) times daily., Disp: 20 tablet, Rfl: 0 .  aspirin 325 MG tablet, Take 325 mg by mouth daily., Disp: ,  Rfl:  .  Cholecalciferol (VITAMIN D3) 125 MCG (5000 UT) CAPS, Take 5,000 Units by mouth., Disp: , Rfl:  .  clotrimazole-betamethasone (LOTRISONE) cream, Apply 1 application topically 2 (two) times daily., Disp: 30 g, Rfl: 0 .  flecainide (TAMBOCOR) 100 MG tablet, Take 100 mg by mouth 2 (two) times daily. , Disp: , Rfl:  .  irbesartan (AVAPRO) 300 MG tablet, Take 1 tablet (300 mg total) by mouth daily., Disp: 90 tablet, Rfl: 3 .  LORazepam (ATIVAN) 0.5 MG tablet, Take 1 tablet (0.5 mg total) by mouth 2 (two) times daily as needed for anxiety., Disp: 30 tablet, Rfl: 1 .  metoprolol succinate (TOPROL-XL) 50 MG 24 hr tablet, Take 50 mg by mouth daily. , Disp: , Rfl:  .  Multiple Vitamins-Minerals (MULTIVITAMIN WITH MINERALS) tablet, Take 1 tablet by mouth daily., Disp: , Rfl:  .  pravastatin (PRAVACHOL) 20 MG tablet, Take 1 tablet (20 mg total) by mouth daily., Disp: 90 tablet, Rfl: 3 Social History   Socioeconomic History  . Marital status: Widowed    Spouse name: Not on file  . Number of children: 3  . Years of education: 65  . Highest education level: 12th grade  Occupational History  . Occupation: retired  Tobacco Use  . Smoking status: Never Smoker  . Smokeless tobacco: Never Used  Vaping Use  . Vaping Use: Never used  Substance and Sexual Activity  . Alcohol use:  Yes    Alcohol/week: 7.0 standard drinks    Types: 7 Glasses of wine per week  . Drug use: No  . Sexual activity: Not Currently  Other Topics Concern  . Not on file  Social History Narrative  . Not on file   Social Determinants of Health   Financial Resource Strain: Not on file  Food Insecurity: Not on file  Transportation Needs: Not on file  Physical Activity: Not on file  Stress: Not on file  Social Connections: Not on file  Intimate Partner Violence: Not on file   Family History  Problem Relation Age of Onset  . Arthritis Mother   . Heart disease Mother   . Hypertension Mother   . Hyperlipidemia Mother    . Kidney disease Mother   . Hypertension Brother   . Stroke Maternal Grandmother   . Heart disease Maternal Grandfather   . Arthritis Paternal Grandmother     Objective: Office vital signs reviewed. BP 138/66   Pulse 60   Temp 98 F (36.7 C) (Temporal)   Ht _0  (1.6 m)   Wt 110 lb (49.9 kg)   SpO2 99%   BMI 19.49 kg/m   Physical Examination:  General: Awake, alert, well nourished, No acute distress Cardio: regular rate and rhythm, S1S2 heard, no murmurs appreciated Pulm: clear to auscultation bilaterally, no wheezes, rhonchi or rales; normal work of breathing on room air MSK: Normal gait and station Psych: Intermittently tearful.  She is pleasant and interactive.  Good eye contact Depression screen [facility] 2/9 05/07/2020 02/05/2020 02/05/2020  Decreased Interest 0 0 0  Down, Depressed, Hopeless 0 1 0  PHQ - 2 Score 0 1 0  Altered sleeping - 0 -  Tired, decreased energy 0 0 -  Change in appetite 0 0 -  Feeling bad or failure about yourself  0 1 -  Trouble concentrating 0 0 -  Moving slowly or fidgety/restless 0 0 -  Suicidal thoughts 0 0 -  PHQ-9 Score - 2 -  Difficult doing work/chores - Not difficult at all -  Some recent data might be hidden   GAD 7 : Generalized Anxiety Score 05/07/2020 02/05/2020 01/31/2019 12/13/2018  Nervous, Anxious, on Edge 0 2 0 1  Control/stop worrying 1 2 0 1  Worry too much - different things 1 2 0 1  Trouble relaxing 0 2 0 0  Restless 0 0 0 0  Easily annoyed or irritable 0 0 0 0  Afraid - awful might happen 0 0 0 0  Total GAD 7 Score 2 8 0 3  Anxiety Difficulty Not difficult at all Not difficult at all Not difficult at all -   Assessment/ Plan: 75 y.o. female   Panic attack  Stress  Paroxysmal atrial fibrillation (HCC) - Plan: CMP14+EGFR, Lipid panel, CBC, TSH  Age-related osteoporosis without current pathological fracture - Plan: VITAMIN D 25 Hydroxy (Vit-D Deficiency, Fractures)  Pain attacks are stable.  She continues to have  stress secondary to holidays and her spouse's recent birthday.  She does not want any medications at this time but I did give her information on mirtazapine should she decide to pursue that.  It apparently has the lowest risk of hyponatremia amongst the class would hopefully have similar effect as the Lexapro on her depressive symptoms  A. fib was controlled both rate and rhythm today.  Plan for labs at some point  DEXA scan was ordered today for her osteoporosis.  We discussed potential treatment  with something like Boniva or Actonel once per month as she exhibited some sensation of feeling poorly after the Fosamax and Prolia was not affordable.  No orders of the defined types were placed in this encounter.  No orders of the defined types were placed in this encounter.    Janora Norlander, DO [place] [phone]

## 2020-05-07 NOTE — Patient Instructions (Signed)
Mirtazapine is the medication we talked about that has lower impact on your sodium and works similarly to [employer] for fasting labs.  i've ordered them if you want to come in before our next visit

## 2020-05-08 DIAGNOSIS — M810 Age-related osteoporosis without current pathological fracture: Secondary | ICD-10-CM | POA: Diagnosis not present

## 2020-05-08 DIAGNOSIS — Z780 Asymptomatic menopausal state: Secondary | ICD-10-CM | POA: Diagnosis not present

## 2020-05-08 DIAGNOSIS — M8589 Other specified disorders of bone density and structure, multiple sites: Secondary | ICD-10-CM | POA: Diagnosis not present

## 2020-05-09 ENCOUNTER — Other Ambulatory Visit: Payer: Self-pay | Admitting: Family Medicine

## 2020-05-09 MED ORDER — IBANDRONATE SODIUM 150 MG PO TABS: 150.0000 mg | ORAL_TABLET | ORAL | 3 refills | Status: DC

## 2020-05-14 ENCOUNTER — Telehealth: Payer: Self-pay

## 2020-05-14 NOTE — Telephone Encounter (Signed)
Called and spoke with pharmacist at [facility] patient has allergy to Alendronate and needs approval to fill Boniva.  Patient states that this was discussed at last visit.  Patient had nausea no vomiting with Alendronate.  If ok, Nurse will call pharmacist to approve.

## 2020-05-14 NOTE — Telephone Encounter (Signed)
This was addressed on a paper copy request today.  She has intolerance of fosamax, not a tru allergy.  She wanted to try boniva

## 2020-05-26 ENCOUNTER — Ambulatory Visit (INDEPENDENT_AMBULATORY_CARE_PROVIDER_SITE_OTHER): Payer: Medicare Other

## 2020-05-26 ENCOUNTER — Other Ambulatory Visit: Payer: Self-pay

## 2020-05-26 DIAGNOSIS — Z0000 Encounter for general adult medical examination without abnormal findings: Secondary | ICD-10-CM

## 2020-05-26 MED ORDER — IBANDRONATE SODIUM 150 MG PO TABS: 150.0000 mg | ORAL_TABLET | ORAL | 3 refills | Status: DC

## 2020-05-26 NOTE — Progress Notes (Signed)
MEDICARE ANNUAL WELLNESS VISIT  05/26/2020  Telephone Visit Disclaimer This Medicare AWV was conducted by telephone due to national recommendations for restrictions regarding the COVID-19 Pandemic (e.g. social distancing).  I verified, using two identifiers, that I am speaking with [name] or their authorized healthcare agent. I discussed the limitations, risks, security, and privacy concerns of performing an evaluation and management service by telephone and the potential availability of an in-person appointment in the future. The patient expressed understanding and agreed to proceed.  Location of Patient:  Home Location of Provider (nurse):  [facility]  Subjective:    Dniyah Berdugo is a 75 y.o. female patient of Janora Norlander, DO who had a Medicare Annual Wellness Visit today via telephone. Jessy is Retired and lives alone. She has three children, eleven grandchildren, and three great grandchildren.  She reports that she is socially active and does interact with friends/family regularly. She is moderately physically active and enjoys reading and doing word puzzles on her Ipad..  Patient Care Team: Janora Norlander, DO as PCP - General (Family Medicine)  Advanced Directives 05/26/2020 07/08/2019 01/22/2019 01/20/2018  Does Patient Have a Medical Advance Directive? No No Yes Yes  Type of Advance Directive - Public librarian;Living will Living will;Healthcare Power of Attorney  Does patient want to make changes to medical advance directive? - - No - Patient declined No - Patient declined  Copy of [place] in Chart? - - No - copy requested No - copy requested  Would patient like information on creating a medical advance directive? No - Patient declined No - Patient declined - [facility] Utilization Over the Past 12 Months: # of hospitalizations or ER visits: 1 # of surgeries: 0  Review of Systems    Patient reports that her overall  health is unchanged compared to last year.  History obtained from chart review and the patient  Patient Reported Readings (BP, Pulse, CBG, Weight, etc) none  Pain Assessment Pain : No/denies pain     Current Medications & Allergies (verified) Allergies as of 05/26/2020      Reactions   Fosamax [alendronate Sodium]    Dizziness, elevated blood pressure   Sertraline Nausea Only   Insomnia, did not feel well   Sulfa Antibiotics Nausea Only      Medication List       Accurate as of May 26, 2020  3:00 PM. If you have any questions, ask your nurse or doctor.        amLODipine 5 MG tablet Commonly known as: NORVASC TAKE 1 TABLET BY MOUTH  DAILY   aspirin 325 MG tablet Take 325 mg by mouth daily.   clotrimazole-betamethasone cream Commonly known as: LOTRISONE Apply 1 application topically 2 (two) times daily.   flecainide 100 MG tablet Commonly known as: TAMBOCOR Take 100 mg by mouth 2 (two) times daily.   ibandronate 150 MG tablet Commonly known as: Boniva Take 1 tablet (150 mg total) by mouth every 30 (thirty) days. Take in the morning with a full glass of water, on an empty stomach, and do not take anything else by mouth or lie down for the next 30 min.   irbesartan 300 MG tablet Commonly known as: AVAPRO Take 1 tablet (300 mg total) by mouth daily.   metoprolol succinate 50 MG 24 hr tablet Commonly known as: TOPROL-XL Take 50 mg by mouth daily.   multivitamin with minerals tablet Take 1 tablet  by mouth daily.   pravastatin 20 MG tablet Commonly known as: PRAVACHOL Take 1 tablet (20 mg total) by mouth daily.   Vitamin D3 125 MCG (5000 UT) Caps Take 5,000 Units by mouth.       History (reviewed): Past Medical History:  Diagnosis Date  . Anxiety   . Arthritis   . Hypertension   . Irregular heart beat    Past Surgical History:  Procedure Laterality Date  . ABDOMINAL HYSTERECTOMY    . KNEE ARTHROSCOPY    . TONSILLECTOMY     Family History   Problem Relation Age of Onset  . Arthritis Mother   . Heart disease Mother   . Hypertension Mother   . Hyperlipidemia Mother   . Kidney disease Mother   . Hypertension Brother   . Stroke Maternal Grandmother   . Heart disease Maternal Grandfather   . Arthritis Paternal Grandmother    Social History   Socioeconomic History  . Marital status: Widowed    Spouse name: Not on file  . Number of children: 3  . Years of education: 77  . Highest education level: 12th grade  Occupational History  . Occupation: retired  Tobacco Use  . Smoking status: Never Smoker  . Smokeless tobacco: Never Used  Vaping Use  . Vaping Use: Never used  Substance and Sexual Activity  . Alcohol use: Yes    Alcohol/week: 7.0 standard drinks    Types: 7 Glasses of wine per week  . Drug use: No  . Sexual activity: Not Currently  Other Topics Concern  . Not on file  Social History Narrative  . Not on file   Social Determinants of Health   Financial Resource Strain: Not on file  Food Insecurity: Not on file  Transportation Needs: Not on file  Physical Activity: Not on file  Stress: Not on file  Social Connections: Not on file    Activities of Daily Living In your present state of health, do you have any difficulty performing the following activities: 05/26/2020  Hearing? N  Vision? N  Difficulty concentrating or making decisions? N  Walking or climbing stairs? N  Dressing or bathing? N  Doing errands, shopping? N  Preparing Food and eating ? N  Using the Toilet? N  In the past six months, have you accidently leaked urine? N  Do you have problems with loss of bowel control? N  Managing your Medications? N  Managing your Finances? N  Housekeeping or managing your Housekeeping? N  Some recent data might be hidden    Patient Education/ Literacy How often do you need to have someone help you when you read instructions, pamphlets, or other written materials from your doctor or pharmacy?: 1 -  Never What is the last grade level you completed in school?: 12th grade  Exercise Current Exercise Habits: Home exercise routine, Type of exercise: walking, Time (Minutes): 30, Frequency (Times/Week): 7, Weekly Exercise (Minutes/Week): 210, Intensity: Mild, Exercise limited by: None identified  Diet Patient reports consuming 3 meals a day and 1 snack(s) a day Patient reports that her primary diet is: Regular Patient reports that she does have regular access to food.   Depression Screen PHQ 2/9 Scores 05/07/2020 02/05/2020 02/05/2020 11/15/2019 08/06/2019 08/06/2019 01/31/2019  PHQ - 2 Score 0 1 0 0 0 0 0  PHQ- 9 Score - 2 - - 0 - 0     Fall Risk Fall Risk  05/26/2020 05/07/2020 02/05/2020 11/15/2019 08/06/2019  Falls in the past  year? 1 0 0 0 0  Comment - - - - -  Number falls in past yr: 0 - - - -  Injury with Fall? 0 - - - -  Risk for fall due to : History of fall(s) - - - -  Follow up Falls evaluation completed - - - -  Comment - - - - -     Objective:  [name] seemed alert and oriented and she participated appropriately during our telephone visit.  Blood Pressure Weight BMI  BP Readings from Last 3 Encounters:  05/07/20 138/66  02/05/20 (!) 159/74  11/15/19 (!) 136/70   Wt Readings from Last 3 Encounters:  05/07/20 110 lb (49.9 kg)  02/05/20 109 lb (49.4 kg)  11/15/19 109 lb (49.4 kg)   BMI Readings from Last 1 Encounters:  05/07/20 19.49 kg/m    *Unable to obtain current vital signs, weight, and BMI due to telephone visit type  Hearing/Vision  . Kabrina did not seem to have difficulty with hearing/understanding during the telephone conversation . Reports that she has not had a formal eye exam by an eye care professional within the past year . Reports that she has not had a formal hearing evaluation within the past year *Unable to fully assess hearing and vision during telephone visit type  Cognitive Function: 6CIT Screen 05/26/2020 01/22/2019  What Year? 0  points 0 points  What month? 0 points 0 points  What time? 0 points 0 points  Count back from 20 0 points 0 points  Months in reverse 0 points 0 points  Repeat phrase 2 points 2 points  Total Score 2 2   (Normal:0-7, Significant for Dysfunction: >8)  Normal Cognitive Function Screening: Yes   Immunization & Health Maintenance Record Immunization History  Administered Date(s) Administered  . Fluad Quad(high Dose 65+) 02/05/2020  . Influenza Split 06/13/2013  . Influenza, High Dose Seasonal PF 06/06/2012  . Influenza, Seasonal, Injecte, Preservative Fre 03/31/2015  . PFIZER(Purple Top)SARS-COV-2 Vaccination 11/30/2019, 12/21/2019  . Pneumococcal Conjugate-13 06/13/2013  . Pneumococcal Polysaccharide-23 04/17/2010  . Pneumococcal-Unspecified 04/17/2010  . Td 04/27/2003  . Tdap 06/13/2013    Health Maintenance  Topic Date Due  . COLONOSCOPY (Pts 45-65yrs Insurance coverage will need to be confirmed)  09/29/2026 (Originally 08/29/2016)  . COVID-19 Vaccine (3 - Booster for Pfizer series) 06/22/2020  . DEXA SCAN  05/08/2022  . TETANUS/TDAP  06/14/2023  . INFLUENZA VACCINE  Completed  . Hepatitis C Screening  Completed  . PNA vac Low Risk Adult  Completed       Assessment  This is a routine wellness examination for Nicole Defino.  Health Maintenance: Due or Overdue There are no preventive care reminders to display for this patient.  [name] does not need a referral for Community Assistance: Care Management:   no Social Work:    no Prescription Assistance:  no Nutrition/Diabetes Education:  no   Plan:  Personalized Goals Goals Addressed            This Visit's Progress   . Patient Stated       05/26/2020 AWV Goal: Fall Prevention  . Over the next year, patient will decrease their risk for falls by: o Using assistive devices, such as a cane or walker, as needed o Identifying fall risks within their home and correcting them by: - Removing throw  rugs - Adding handrails to stairs or ramps - Removing clutter and keeping a clear pathway throughout the home - Increasing light,  especially at night - Adding shower handles/bars - Raising toilet seat o Identifying potential personal risk factors for falls: - Medication side effects - Incontinence/urgency - Vestibular dysfunction - Hearing loss - Musculoskeletal disorders - Neurological disorders - Orthostatic hypotension        Personalized Health Maintenance & Screening Recommendations  Shingrix vaccine  Lung Cancer Screening Recommended: no (Low Dose CT Chest recommended if Age 54-80 years, 30 pack-year currently smoking OR have quit w/in past 15 years) Hepatitis C Screening recommended: no HIV Screening recommended: no  Advanced Directives: Written information was not prepared per patient's request.  Referrals & Orders No orders of the defined types were placed in this encounter.   Follow-up Plan . Follow-up with Janora Norlander, DO as planned . Schedule Shingrix vaccine    I have personally reviewed and noted the following in the patient's chart:   . Medical and social history . Use of alcohol, tobacco or illicit drugs  . Current medications and supplements . Functional ability and status . Nutritional status . Physical activity . Advanced directives . List of other physicians . Hospitalizations, surgeries, and ER visits in previous 12 months . Vitals . Screenings to include cognitive, depression, and falls . Referrals and appointments  In addition, I have reviewed and discussed with [name] certain preventive protocols, quality metrics, and best practice recommendations. A written personalized care plan for preventive services as well as general preventive health recommendations is available and can be mailed to the patient at her request.      Felicity Coyer, LPN    [id number]/[id number]   Patient declines after visit summary

## 2020-05-26 NOTE — Telephone Encounter (Signed)
Patient's pharmacy, [facility], is unable to provider Boniva at this time, it is on backorder.  Please send to [facility] instead.

## 2020-06-02 ENCOUNTER — Telehealth: Payer: Self-pay

## 2020-06-02 NOTE — Telephone Encounter (Signed)
Patient reports that she is unable to take the [facility] and has been unable to take Fosamax in the past.  She would like to start Prolia injections instead and would like [place] to find out cost and let her know.

## 2020-06-02 NOTE — Telephone Encounter (Signed)
Ok to switch. Mary [name], please try and arrange

## 2020-08-05 ENCOUNTER — Telehealth: Payer: Self-pay

## 2020-08-05 NOTE — Telephone Encounter (Signed)
Benefit verification for Prolia:  Cost $290.00 Ready to schedule if patient is okay with cost.

## 2020-08-14 NOTE — Telephone Encounter (Signed)
Patient is not sure about getting Prolia at this time.  Will let us know.  Notified of cost $290.00

## 2020-08-28 ENCOUNTER — Telehealth: Payer: Self-pay

## 2020-09-01 ENCOUNTER — Other Ambulatory Visit: Payer: Medicare Other

## 2020-09-01 ENCOUNTER — Other Ambulatory Visit: Payer: Self-pay

## 2020-09-01 DIAGNOSIS — I480 Paroxysmal atrial fibrillation: Secondary | ICD-10-CM

## 2020-09-01 DIAGNOSIS — M810 Age-related osteoporosis without current pathological fracture: Secondary | ICD-10-CM | POA: Diagnosis not present

## 2020-09-01 DIAGNOSIS — E785 Hyperlipidemia, unspecified: Secondary | ICD-10-CM | POA: Diagnosis not present

## 2020-09-02 ENCOUNTER — Other Ambulatory Visit: Payer: Self-pay | Admitting: *Deleted

## 2020-09-02 DIAGNOSIS — D649 Anemia, unspecified: Secondary | ICD-10-CM

## 2020-09-02 LAB — CBC
Hematocrit: 31.8 % — ABNORMAL LOW (ref 34.0–46.6)
Hemoglobin: 10.5 g/dL — ABNORMAL LOW (ref 11.1–15.9)
MCH: 30.4 pg (ref 26.6–33.0)
MCHC: 33 g/dL (ref 31.5–35.7)
MCV: 92 fL (ref 79–97)
Platelets: 304 10*3/uL (ref 150–450)
RBC: 3.45 x10E6/uL — ABNORMAL LOW (ref 3.77–5.28)
RDW: 12.3 % (ref 11.7–15.4)
WBC: 6.4 10*3/uL (ref 3.4–10.8)

## 2020-09-02 LAB — LIPID PANEL
Chol/HDL Ratio: 1.5 ratio (ref 0.0–4.4)
Cholesterol, Total: 151 mg/dL (ref 100–199)
HDL: 101 mg/dL (ref >39)
LDL Chol Calc (NIH): 42 mg/dL (ref 0–99)
Triglycerides: 26 mg/dL (ref 0–149)
VLDL Cholesterol Cal: 8 mg/dL (ref 5–40)

## 2020-09-02 LAB — CMP14+EGFR
ALT: 13 IU/L (ref 0–32)
AST: 19 IU/L (ref 0–40)
Albumin/Globulin Ratio: 2.2 (ref 1.2–2.2)
Albumin: 4.4 g/dL (ref 3.7–4.7)
Alkaline Phosphatase: 70 IU/L (ref 44–121)
BUN/Creatinine Ratio: 16 (ref 12–28)
BUN: 10 mg/dL (ref 8–27)
Bilirubin Total: 0.3 mg/dL (ref 0.0–1.2)
CO2: 25 mmol/L (ref 20–29)
Calcium: 9.4 mg/dL (ref 8.7–10.3)
Chloride: 96 mmol/L (ref 96–106)
Creatinine, Ser: 0.62 mg/dL (ref 0.57–1.00)
Globulin, Total: 2 g/dL (ref 1.5–4.5)
Glucose: 91 mg/dL (ref 65–99)
Potassium: 4.8 mmol/L (ref 3.5–5.2)
Sodium: 134 mmol/L (ref 134–144)
Total Protein: 6.4 g/dL (ref 6.0–8.5)
eGFR: 93 mL/min/{1.73_m2} (ref >59)

## 2020-09-02 LAB — TSH: TSH: 2.46 u[IU]/mL (ref 0.450–4.500)

## 2020-09-02 LAB — VITAMIN D 25 HYDROXY (VIT D DEFICIENCY, FRACTURES): Vit D, 25-Hydroxy: 85.7 ng/mL (ref 30.0–100.0)

## 2020-09-03 ENCOUNTER — Other Ambulatory Visit: Payer: Self-pay

## 2020-09-03 ENCOUNTER — Encounter: Payer: Self-pay | Admitting: Family Medicine

## 2020-09-03 ENCOUNTER — Ambulatory Visit (INDEPENDENT_AMBULATORY_CARE_PROVIDER_SITE_OTHER): Payer: Medicare Other | Admitting: Family Medicine

## 2020-09-03 VITALS — BP 130/60 | HR 98 | Temp 97.5°F | Ht 63.0 in | Wt 110.2 lb

## 2020-09-03 DIAGNOSIS — D649 Anemia, unspecified: Secondary | ICD-10-CM | POA: Diagnosis not present

## 2020-09-03 DIAGNOSIS — Z0001 Encounter for general adult medical examination with abnormal findings: Secondary | ICD-10-CM

## 2020-09-03 DIAGNOSIS — M7752 Other enthesopathy of left foot: Secondary | ICD-10-CM

## 2020-09-03 DIAGNOSIS — I480 Paroxysmal atrial fibrillation: Secondary | ICD-10-CM

## 2020-09-03 DIAGNOSIS — I10 Essential (primary) hypertension: Secondary | ICD-10-CM | POA: Diagnosis not present

## 2020-09-03 DIAGNOSIS — Z0000 Encounter for general adult medical examination without abnormal findings: Secondary | ICD-10-CM

## 2020-09-03 DIAGNOSIS — M810 Age-related osteoporosis without current pathological fracture: Secondary | ICD-10-CM

## 2020-09-03 NOTE — Patient Instructions (Signed)
Bursitis  Bursitis is inflammation and irritation of a bursa, which is one of the small, fluid-filled sacs that cushion and protect the moving parts of your body. These sacs are located between bones and muscles, bones and muscle attachments, or bones and skin areas that are next to bones. A bursa protects those structures from the wear and tear that results from frequent movement. An inflamed bursa causes pain and swelling. Fluid may build up inside the sac. Bursitis is most common near joints, especially the knees, elbows, hips, and shoulders. What are the causes? This condition may be caused by:  Injury from: ? A direct hit (blow), like falling on your knee or elbow. ? Overuse of a joint (repetitive stress).  Infection. This can happen if bacteria get into a bursa through a cut or scrape near a joint.  Diseases that cause joint inflammation, such as gout and rheumatoid arthritis. What increases the risk? You are more likely to develop this condition if you:  Have a job or hobby that involves a lot of repetitive stress on your joints.  Have a condition that weakens your body's defense system (immune system), such as diabetes, cancer, or HIV.  Do any of these often: ? Lift and reach overhead. ? Kneel or lean on hard surfaces. ? Run or walk. What are the signs or symptoms? The most common symptoms of this condition include:  Pain that gets worse when you move the affected body part or use it to support (bear) your body weight.  Inflammation.  Stiffness. Other symptoms include:  Redness.  Swelling.  Tenderness.  Warmth.  Pain that continues after rest.  Fever or chills. These may occur in bursitis that is caused by infection. How is this diagnosed? This condition may be diagnosed based on:  Your medical history and a physical exam.  Imaging tests, such as an MRI.  A procedure to drain fluid from the bursa with a needle (aspiration). The fluid may be checked for  signs of infection or gout.  Blood tests to rule out other causes of inflammation. How is this treated? This condition can usually be treated at home with rest, ice, applying pressure (compression), and raising the body part that is affected (elevation). This is called RICE therapy. For mild bursitis, RICE therapy may be all you need. Other treatments may include:  NSAIDs to treat pain and inflammation.  Corticosteroid medicines to fight inflammation. These medicines may be injected into and around the area of bursitis.  Aspiration of fluid from the bursa to relieve pain and improve movement.  Antibiotic medicine to treat an infected bursa.  A splint, brace, or walking aid, such as a cane.  Physical therapy if you continue to have pain or limited movement.  Surgery to remove a damaged or infected bursa. This may be needed if other treatments have not worked. Follow these instructions at home: Medicines  Take over-the-counter and prescription medicines only as told by your health care provider.  If you were prescribed an antibiotic medicine, take it as told by your health care provider. Do not stop taking the antibiotic even if you start to feel better. General instructions  Rest the affected area as told by your health care provider. ? If possible, raise (elevate) the affected area above the level of your heart while you are sitting or lying down. ? Avoid activities that make pain worse.  Use splints, braces, pads, or walking aids as told by your health care provider.  If directed,  put ice on the affected area: ? If you have a removable splint or brace, remove it as told by your health care provider. ? Put ice in a plastic bag. ? Place a towel between your skin and the bag or between your splint or brace and the bag. ? Leave the ice on for 20 minutes, 2-3 times a day.  Keep all follow-up visits as told by your health care provider. This is important.   Preventing future  episodes Take actions to help prevent future episodes of bursitis.  Wear knee pads if you kneel often.  Wear sturdy running or walking shoes that fit you well.  Take breaks regularly from repetitive activity.  Warm up by stretching before doing any activity that takes a lot of effort.  Maintain a healthy weight or lose weight as recommended by your health care provider. If you need help doing this, ask your health care provider.  Exercise regularly. Start any new physical activity gradually. Contact a health care provider if you:  Have a fever.  Have chills.  Have bursitis that is not getting better with treatment or home care. Summary  Bursitis is inflammation and irritation of a bursa, which is one of the small, fluid-filled sacs that cushion and protect the moving parts of your body.  An inflamed bursa causes pain and swelling.  Bursitis is commonly diagnosed with a physical exam, but other tests are sometimes needed.  This condition can usually be treated at home with rest, ice, applying pressure (compression), and raising the body part that is affected (elevation). This is called RICE therapy. This information is not intended to replace advice given to you by your health care provider. Make sure you discuss any questions you have with your health care provider. Document Revised: 10/17/2019 Document Reviewed: 10/17/2019 Elsevier Patient Education  [place].

## 2020-09-03 NOTE — Progress Notes (Signed)
Mary [name] is a 75 y.o. female presents to office today for annual physical exam examination.    Concerns today include: 1.  Left ankle lump Patient reports that she has been experiencing some fluid on her left ankle that has insidiously onset.  No preceding injury that she can recall.  The fluid-filled patches not tender, not warm.  She has not done anything for treatment.  2.  Hemoglobin abnormality Patient had labs prior to today's visit which did show a slight drop in her hemoglobin from baseline of 12.1-10.5.  She denies any bleeding.  She admits that she had discontinued some oral iron after she did not find it to be effective for her thinning hair.  This is the only major change that she is made from medication or diet standpoint.  She leads a well balanced lifestyle with physical exercise and balanced diet.  Occupation: Retired, Marital status: Widowed, Substance use: None Diet: Balanced, Exercise: Structured Last eye exam: Up-to-date Last dental exam: Up-to-date Last colonoscopy: Declines Last mammogram: Up-to-date Last pap smear: Up-to-date Refills needed today: None Immunizations needed:  Immunization History  Administered Date(s) Administered  . Fluad Quad(high Dose 65+) 02/05/2020  . Influenza Split 06/13/2013  . Influenza, High Dose Seasonal PF 06/06/2012  . Influenza, Seasonal, Injecte, Preservative Fre 03/31/2015  . PFIZER(Purple Top)SARS-COV-2 Vaccination 11/30/2019, 12/21/2019  . Pneumococcal Conjugate-13 06/13/2013  . Pneumococcal Polysaccharide-23 04/17/2010  . Pneumococcal-Unspecified 04/17/2010  . Td 04/27/2003  . Tdap 06/13/2013     Past Medical History:  Diagnosis Date  . Anxiety   . Arthritis   . Hypertension   . Irregular heart beat    Social History   Socioeconomic History  . Marital status: Widowed    Spouse name: Not on file  . Number of children: 3  . Years of education: 26  . Highest education level: 12th grade  Occupational  History  . Occupation: retired  Tobacco Use  . Smoking status: Never Smoker  . Smokeless tobacco: Never Used  Vaping Use  . Vaping Use: Never used  Substance and Sexual Activity  . Alcohol use: Yes    Alcohol/week: 7.0 standard drinks    Types: 7 Glasses of wine per week  . Drug use: No  . Sexual activity: Not Currently  Other Topics Concern  . Not on file  Social History Narrative  . Not on file   Social Determinants of Health   Financial Resource Strain: Not on file  Food Insecurity: Not on file  Transportation Needs: Not on file  Physical Activity: Not on file  Stress: Not on file  Social Connections: Not on file  Intimate Partner Violence: Not on file   Past Surgical History:  Procedure Laterality Date  . ABDOMINAL HYSTERECTOMY    . KNEE ARTHROSCOPY    . TONSILLECTOMY     Family History  Problem Relation Age of Onset  . Arthritis Mother   . Heart disease Mother   . Hypertension Mother   . Hyperlipidemia Mother   . Kidney disease Mother   . Hypertension Brother   . Stroke Maternal Grandmother   . Heart disease Maternal Grandfather   . Arthritis Paternal Grandmother     Current Outpatient Medications:  .  amLODipine (NORVASC) 5 MG tablet, TAKE 1 TABLET BY MOUTH  DAILY, Disp: 90 tablet, Rfl: 1 .  aspirin 325 MG tablet, Take 325 mg by mouth daily., Disp: , Rfl:  .  Cholecalciferol (VITAMIN D3) 125 MCG (5000 UT) CAPS, Take 5,000 Units  by mouth., Disp: , Rfl:  .  flecainide (TAMBOCOR) 100 MG tablet, Take 100 mg by mouth 2 (two) times daily. , Disp: , Rfl:  .  irbesartan (AVAPRO) 300 MG tablet, Take 1 tablet (300 mg total) by mouth daily., Disp: 90 tablet, Rfl: 3 .  metoprolol succinate (TOPROL-XL) 50 MG 24 hr tablet, Take 50 mg by mouth daily. , Disp: , Rfl:  .  Multiple Vitamins-Minerals (MULTIVITAMIN WITH MINERALS) tablet, Take 1 tablet by mouth daily., Disp: , Rfl:  .  pravastatin (PRAVACHOL) 20 MG tablet, Take 1 tablet (20 mg total) by mouth daily., Disp: 90  tablet, Rfl: 3  Allergies  Allergen Reactions  . Fosamax [Alendronate Sodium]     Dizziness, elevated blood pressure  . Sertraline Nausea Only    Insomnia, did not feel well  . Sulfa Antibiotics Nausea Only     ROS: Review of Systems Pertinent items noted in HPI and remainder of comprehensive ROS otherwise negative.    Physical exam BP 130/60   Pulse 98   Temp (!) 97.5 F (36.4 C)   Ht 5\' 3"  (1.6 m)   Wt 110 lb 3.2 oz (50 kg)   SpO2 98%   BMI 19.52 kg/m  General appearance: alert, cooperative, appears stated age and no distress Head: Normocephalic, without obvious abnormality, atraumatic Eyes: negative findings: lids and lashes normal, conjunctivae and sclerae normal, corneas clear and pupils equal, round, reactive to light and accomodation Ears: normal TM's and external ear canals both ears Nose: Nares normal. Septum midline. Mucosa normal. No drainage or sinus tenderness. Throat: lips, mucosa, and tongue normal; teeth and gums normal Neck: no adenopathy, no carotid bruit, supple, symmetrical, trachea midline and thyroid not enlarged, symmetric, no tenderness/mass/nodules Back: symmetric, no curvature. ROM normal. No CVA tenderness. Lungs: clear to auscultation bilaterally Heart: regular rate and rhythm, S1, S2 normal, no murmur, click, rub or gallop Abdomen: soft, non-tender; bowel sounds normal; no masses,  no organomegaly Extremities: extremities normal, atraumatic, no cyanosis or edema Pulses: 2+ and symmetric Skin: Skin color, texture, turgor normal. No rashes or lesions Lymph nodes: Cervical, supraclavicular, and axillary nodes normal. Neurologic: Alert and oriented X 3, normal strength and tone. Normal symmetric reflexes. Normal coordination and gait Psych: Tearful when talking about her spouse.  Otherwise stable Left ankle: Left ankle with palpable, well-circumscribed, mobile, nontender, not erythematous fluid-filled bursa noted along the lateral malleolus distally.   There is no evidence of secondary infection  Assessment/ Plan: Mary [name] here for annual physical exam.   Annual physical exam  Low hemoglobin - Plan: Fecal occult blood, imunochemical, Hemoglobin, fingerstick, Anemia Profile B  Paroxysmal atrial fibrillation (HCC)  Age-related osteoporosis without current pathological fracture  Essential hypertension with goal blood pressure less than 140/90  Bursitis of left ankle  Fingerstick hemoglobin was slightly better than her serum check.  I placed an anemia profile.  This would be worth collecting at some point if her hemoglobin remains low.  We will await FOBT.  She is fairly clear that if she in fact had some type of insidious bleed she would not want to pursue any type of colonoscopy or work-up for colon cancer as she watched her spouse suffer through treatment for cancer and it did not seem to really change the outcome.  She would not want to have a a longer life at the expense of the poor quality of life.  She is seen both rate and rhythm controlled today  We reviewed her labs in detail  today and a handout was given to her  Repeat blood pressure check was within normal range.  No changes needed  Lesion of left ankle is consistent with a bursitis.  We wrapped her ankle and I encouraged her to continue compression.  If it becomes symptomatic low threshold to have her evaluated by Ortho to consider drainage and/or corticosteroid injection.  There is certainly no evidence of infection but we discussed that this would be reason for reevaluation.  Recommended getting shingles shot at her pharmacy as this was not covered in office  Counseled on healthy lifestyle choices, including diet (rich in fruits, vegetables and lean meats and low in salt and simple carbohydrates) and exercise (at least 30 minutes of moderate physical activity daily).  Patient to follow up in 1 year for annual exam or sooner if needed.  Litzi Binning M. [name],  DO

## 2020-09-04 LAB — FECAL OCCULT BLOOD, IMMUNOCHEMICAL: Fecal Occult Bld: NEGATIVE

## 2020-09-04 LAB — HEMOGLOBIN, FINGERSTICK: Hemoglobin: 11.3 g/dL (ref 11.1–15.9)

## 2020-09-18 ENCOUNTER — Ambulatory Visit (INDEPENDENT_AMBULATORY_CARE_PROVIDER_SITE_OTHER): Payer: Medicare Other | Admitting: Family

## 2020-09-18 ENCOUNTER — Other Ambulatory Visit: Payer: Self-pay

## 2020-09-18 ENCOUNTER — Encounter: Payer: Self-pay | Admitting: Family

## 2020-09-18 VITALS — BP 132/61 | HR 59 | Temp 97.7°F | Ht 63.0 in | Wt 110.4 lb

## 2020-09-18 DIAGNOSIS — M7752 Other enthesopathy of left foot: Secondary | ICD-10-CM

## 2020-09-18 NOTE — Progress Notes (Signed)
   Subjective:    Patient ID: Mary [name], female    DOB: [birth year], 75 y.o.   MRN: [id number]  Chief Complaint  Patient presents with  . bursa    On left ankle tender when tuched     HPI Pt presents to the office today with left ankle bursa swelling that she noticed 6 months ago, but yesterday noticed slight tenderness with palpation. She reports aching pain of 1 out 10. She states she lives alone and is on her feet all day. She is also a walker and walks about a hour a day. However, since seeing her PCP two weeks ago she had decreased that and trying to do exercises on her bed.    Review of Systems  All other systems reviewed and are negative.      Objective:   Physical Exam Vitals reviewed.  Constitutional:      General: She is not in acute distress.    Appearance: She is well-developed.  HENT:     Head: Normocephalic and atraumatic.  Eyes:     Pupils: Pupils are equal, round, and reactive to light.  Neck:     Thyroid: No thyromegaly.  Cardiovascular:     Rate and Rhythm: Normal rate and regular rhythm.     Heart sounds: Normal heart sounds. No murmur heard.   Pulmonary:     Effort: Pulmonary effort is normal. No respiratory distress.     Breath sounds: Normal breath sounds. No wheezing.  Abdominal:     General: Bowel sounds are normal. There is no distension.     Palpations: Abdomen is soft.     Tenderness: There is no abdominal tenderness.  Musculoskeletal:        General: No tenderness. Normal range of motion.     Cervical back: Normal range of motion and neck supple.       Feet:  Feet:     Comments: Left lateral ankle bursa swollen, no redness noted, mild warmth. No tenderness noted.  Skin:    General: Skin is warm and dry.  Neurological:     Mental Status: She is alert and oriented to person, place, and time.     Cranial Nerves: No cranial nerve deficit.     Deep Tendon Reflexes: Reflexes are normal and symmetric.  Psychiatric:        Behavior:  Behavior normal.        Thought Content: Thought content normal.        Judgment: Judgment normal.      BP 132/61   Pulse (!) 59   Temp 97.7 F (36.5 C) (Temporal)   Ht 5\' 3"  (1.6 m)   Wt 110 lb 6.4 oz (50.1 kg)   BMI 19.56 kg/m       Assessment & Plan:  [name] comes in today with chief complaint of bursa (On left ankle tender when tuched )   Diagnosis and orders addressed:  1. Bursitis of left ankle Rest Keep elevated  Take Motrin prn  Avoid pressure on area Keep follow up with PCP, discussed usually draining the area returns.  Evelina Dun, FNP

## 2020-09-18 NOTE — Patient Instructions (Signed)
Bursitis  Bursitis is inflammation and irritation of a bursa, which is one of the small, fluid-filled sacs that cushion and protect the moving parts of your body. These sacs are located between bones and muscles, bones and muscle attachments, or bones and skin areas that are next to bones. A bursa protects those structures from the wear and tear that results from frequent movement. An inflamed bursa causes pain and swelling. Fluid may build up inside the sac. Bursitis is most common near joints, especially the knees, elbows, hips, and shoulders. What are the causes? This condition may be caused by:  Injury from: ? A direct hit (blow), like falling on your knee or elbow. ? Overuse of a joint (repetitive stress).  Infection. This can happen if bacteria get into a bursa through a cut or scrape near a joint.  Diseases that cause joint inflammation, such as gout and rheumatoid arthritis. What increases the risk? You are more likely to develop this condition if you:  Have a job or hobby that involves a lot of repetitive stress on your joints.  Have a condition that weakens your body's defense system (immune system), such as diabetes, cancer, or HIV.  Do any of these often: ? Lift and reach overhead. ? Kneel or lean on hard surfaces. ? Run or walk. What are the signs or symptoms? The most common symptoms of this condition include:  Pain that gets worse when you move the affected body part or use it to support (bear) your body weight.  Inflammation.  Stiffness. Other symptoms include:  Redness.  Swelling.  Tenderness.  Warmth.  Pain that continues after rest.  Fever or chills. These may occur in bursitis that is caused by infection. How is this diagnosed? This condition may be diagnosed based on:  Your medical history and a physical exam.  Imaging tests, such as an MRI.  A procedure to drain fluid from the bursa with a needle (aspiration). The fluid may be checked for  signs of infection or gout.  Blood tests to rule out other causes of inflammation. How is this treated? This condition can usually be treated at home with rest, ice, applying pressure (compression), and raising the body part that is affected (elevation). This is called RICE therapy. For mild bursitis, RICE therapy may be all you need. Other treatments may include:  NSAIDs to treat pain and inflammation.  Corticosteroid medicines to fight inflammation. These medicines may be injected into and around the area of bursitis.  Aspiration of fluid from the bursa to relieve pain and improve movement.  Antibiotic medicine to treat an infected bursa.  A splint, brace, or walking aid, such as a cane.  Physical therapy if you continue to have pain or limited movement.  Surgery to remove a damaged or infected bursa. This may be needed if other treatments have not worked. Follow these instructions at home: Medicines  Take over-the-counter and prescription medicines only as told by your health care provider.  If you were prescribed an antibiotic medicine, take it as told by your health care provider. Do not stop taking the antibiotic even if you start to feel better. General instructions  Rest the affected area as told by your health care provider. ? If possible, raise (elevate) the affected area above the level of your heart while you are sitting or lying down. ? Avoid activities that make pain worse.  Use splints, braces, pads, or walking aids as told by your health care provider.  If directed,  put ice on the affected area: ? If you have a removable splint or brace, remove it as told by your health care provider. ? Put ice in a plastic bag. ? Place a towel between your skin and the bag or between your splint or brace and the bag. ? Leave the ice on for 20 minutes, 2-3 times a day.  Keep all follow-up visits as told by your health care provider. This is important.   Preventing future  episodes Take actions to help prevent future episodes of bursitis.  Wear knee pads if you kneel often.  Wear sturdy running or walking shoes that fit you well.  Take breaks regularly from repetitive activity.  Warm up by stretching before doing any activity that takes a lot of effort.  Maintain a healthy weight or lose weight as recommended by your health care provider. If you need help doing this, ask your health care provider.  Exercise regularly. Start any new physical activity gradually. Contact a health care provider if you:  Have a fever.  Have chills.  Have bursitis that is not getting better with treatment or home care. Summary  Bursitis is inflammation and irritation of a bursa, which is one of the small, fluid-filled sacs that cushion and protect the moving parts of your body.  An inflamed bursa causes pain and swelling.  Bursitis is commonly diagnosed with a physical exam, but other tests are sometimes needed.  This condition can usually be treated at home with rest, ice, applying pressure (compression), and raising the body part that is affected (elevation). This is called RICE therapy. This information is not intended to replace advice given to you by your health care provider. Make sure you discuss any questions you have with your health care provider. Document Revised: 10/17/2019 Document Reviewed: 10/17/2019 Elsevier Patient Education  [place].

## 2020-09-23 DIAGNOSIS — H2513 Age-related nuclear cataract, bilateral: Secondary | ICD-10-CM | POA: Diagnosis not present

## 2020-09-30 ENCOUNTER — Other Ambulatory Visit: Payer: Self-pay | Admitting: Family Medicine

## 2020-09-30 DIAGNOSIS — I10 Essential (primary) hypertension: Secondary | ICD-10-CM

## 2020-10-06 ENCOUNTER — Ambulatory Visit (INDEPENDENT_AMBULATORY_CARE_PROVIDER_SITE_OTHER): Payer: Medicare Other | Admitting: Family Medicine

## 2020-10-06 ENCOUNTER — Other Ambulatory Visit: Payer: Self-pay

## 2020-10-06 ENCOUNTER — Encounter: Payer: Self-pay | Admitting: Family Medicine

## 2020-10-06 VITALS — BP 133/58 | HR 56 | Temp 97.6°F | Ht 63.0 in | Wt 110.4 lb

## 2020-10-06 DIAGNOSIS — M7752 Other enthesopathy of left foot: Secondary | ICD-10-CM

## 2020-10-06 DIAGNOSIS — D649 Anemia, unspecified: Secondary | ICD-10-CM | POA: Diagnosis not present

## 2020-10-06 DIAGNOSIS — R6889 Other general symptoms and signs: Secondary | ICD-10-CM | POA: Diagnosis not present

## 2020-10-06 LAB — HEMOGLOBIN, FINGERSTICK: Hemoglobin: 9.9 g/dL — ABNORMAL LOW (ref 11.1–15.9)

## 2020-10-06 NOTE — Patient Instructions (Signed)
Will look for the cause of your low hemoglobin (anemia).  Once we have an answer, will treat appropriately.  [employer] medicine (25th ed., pp. [phone]). [place], [place]: Elsevier.">  Anemia  Anemia is a condition in which there is not enough red blood cells or hemoglobin in the blood. Hemoglobin is a substance in red blood cells thatcarries oxygen. When you do not have enough red blood cells or hemoglobin (are anemic), your body cannot get enough oxygen and your organs may not work properly. Asa result, you may feel very tired or have other problems. What are the causes? Common causes of anemia include: Excessive bleeding. Anemia can be caused by excessive bleeding inside or outside the body, including bleeding from the intestines or from heavy menstrual periods in females. Poor nutrition. Long-lasting (chronic) kidney, thyroid, and liver disease. Bone marrow disorders, spleen problems, and blood disorders. Cancer and treatments for cancer. HIV (human immunodeficiency virus) and AIDS (acquired immunodeficiency syndrome). Infections, medicines, and autoimmune disorders that destroy red blood cells. What are the signs or symptoms? Symptoms of this condition include: Minor weakness. Dizziness. Headache, or difficulties concentrating and sleeping. Heartbeats that feel irregular or faster than normal (palpitations). Shortness of breath, especially with exercise. Pale skin, lips, and nails, or cold hands and feet. Indigestion and nausea. Symptoms may occur suddenly or develop slowly. If your anemia is mild, you maynot have symptoms. How is this diagnosed? This condition is diagnosed based on blood tests, your medical history, and a physical exam. In some cases, a test may be needed in which cells are removed from the soft tissue inside of a bone and looked at under a microscope (bone marrow biopsy). Your health care provider may also check your stool (feces) for blood and may do  additional testing to look for the cause of yourbleeding. Other tests may include: Imaging tests, such as a CT scan or MRI. A procedure to see inside your esophagus and stomach (endoscopy). A procedure to see inside your colon and rectum (colonoscopy). How is this treated? Treatment for this condition depends on the cause. If you continue to lose a lot of blood, you may need to be treated at a hospital. Treatment may include: Taking supplements of iron, vitamin U44, or folic acid. Taking a hormone medicine (erythropoietin) that can help to stimulate red blood cell growth. Having a blood transfusion. This may be needed if you lose a lot of blood. Making changes to your diet. Having surgery to remove your spleen. Follow these instructions at home: Take over-the-counter and prescription medicines only as told by your health care provider. Take supplements only as told by your health care provider. Follow any diet instructions that you were given by your health care provider. Keep all follow-up visits as told by your health care provider. This is important. Contact a health care provider if: You develop new bleeding anywhere in the body. Get help right away if: You are very weak. You are short of breath. You have pain in your abdomen or chest. You are dizzy or feel faint. You have trouble concentrating. You have bloody stools, black stools, or tarry stools. You vomit repeatedly or you vomit up blood. These symptoms may represent a serious problem that is an emergency. Do not wait to see if the symptoms will go away. Get medical help right away. Call your local emergency services (911 in the [place] Do not drive yourself to the hospital. Summary Anemia is a condition in which you do not have enough red  blood cells or enough of a substance in your red blood cells that carries oxygen (hemoglobin). Symptoms may occur suddenly or develop slowly. If your anemia is mild, you may not have  symptoms. This condition is diagnosed with blood tests, a medical history, and a physical exam. Other tests may be needed. Treatment for this condition depends on the cause of the anemia. This information is not intended to replace advice given to you by your health care provider. Make sure you discuss any questions you have with your healthcare provider. Document Revised: 03/20/2019 Document Reviewed: 03/20/2019 Elsevier Patient Education  2022 [employer].

## 2020-10-06 NOTE — Progress Notes (Signed)
Subjective: CC: recheck Hgn PCP: Mary Norlander, DO DQQ:[id number] Mary [name] is a 75 y.o. female presenting to clinic today for:  1. Low hemoglobin Patient was noted to have a slight drop in her hemoglobin on her Sep 01, 2020 labs to 10.5.  She subsequently had a repeat fingerstick hemoglobin on the 11th which showed hemoglobin of 11.3. FOBT negative.  She has atrial fibrillation but is not treated with chronic anticoagulation except for aspirin.  She denies any fatigue, lightheadedness or pallor.  She does note that she is struggled with iron deficiency anemia for many years, since childhood and particularly during her pregnancy.  She does supplement with a piece of iron tablet daily and takes this with vitamin C  2.  Ankle bursitis Stable and slightly better.  Does not wish to see an orthopedist at this time as it is not especially bothersome.  ROS: Per HPI  Allergies  Allergen Reactions   Fosamax [Alendronate Sodium]     Dizziness, elevated blood pressure   Sertraline Nausea Only    Insomnia, did not feel well   Sulfa Antibiotics Nausea Only   Past Medical History:  Diagnosis Date   Anxiety    Arthritis    Hypertension    Irregular heart beat     Current Outpatient Medications:    amLODipine (NORVASC) 5 MG tablet, TAKE 1 TABLET BY MOUTH  DAILY, Disp: 90 tablet, Rfl: 0   aspirin 325 MG tablet, Take 325 mg by mouth daily., Disp: , Rfl:    Cholecalciferol (VITAMIN D3) 125 MCG (5000 UT) CAPS, Take 5,000 Units by mouth., Disp: , Rfl:    flecainide (TAMBOCOR) 100 MG tablet, Take 100 mg by mouth 2 (two) times daily. , Disp: , Rfl:    irbesartan (AVAPRO) 300 MG tablet, Take 1 tablet (300 mg total) by mouth daily., Disp: 90 tablet, Rfl: 3   metoprolol succinate (TOPROL-XL) 50 MG 24 hr tablet, Take 50 mg by mouth daily. , Disp: , Rfl:    Multiple Vitamins-Minerals (MULTIVITAMIN WITH MINERALS) tablet, Take 1 tablet by mouth daily., Disp: , Rfl:    pravastatin (PRAVACHOL) 20 MG  tablet, Take 1 tablet (20 mg total) by mouth daily., Disp: 90 tablet, Rfl: 3 Social History   Socioeconomic History   Marital status: Widowed    Spouse name: Not on file   Number of children: 3   Years of education: 63   Highest education level: 12th grade  Occupational History   Occupation: retired  Tobacco Use   Smoking status: Never   Smokeless tobacco: Never  Vaping Use   Vaping Use: Never used  Substance and Sexual Activity   Alcohol use: Yes    Alcohol/week: 7.0 standard drinks    Types: 7 Glasses of wine per week   Drug use: No   Sexual activity: Not Currently  Other Topics Concern   Not on file  Social History Narrative   Not on file   Social Determinants of Health   Financial Resource Strain: Not on file  Food Insecurity: Not on file  Transportation Needs: Not on file  Physical Activity: Not on file  Stress: Not on file  Social Connections: Not on file  Intimate Partner Violence: Not on file   Family History  Problem Relation Age of Onset   Arthritis Mother    Heart disease Mother    Hypertension Mother    Hyperlipidemia Mother    Kidney disease Mother    Hypertension Brother    Stroke  Maternal Grandmother    Heart disease Maternal Grandfather    Arthritis Paternal Grandmother     Objective: Office vital signs reviewed. BP (!) 133/58   Pulse (!) 56   Temp 97.6 F (36.4 C)   Ht 5\' 3"  (1.6 m)   Wt 110 lb 6.4 oz (50.1 kg)   SpO2 100%   BMI 19.56 kg/m   Physical Examination:  General: Awake, alert, well nourished, No acute distress HEENT: Normal; patient is fair but no significant conjunctival pallor appreciated Cardio: regular rate and rhythm, S1S2 heard, no murmurs appreciated Pulm: clear to auscultation bilaterally, no wheezes, rhonchi or rales; normal work of breathing on room air Extremities: warm, well perfused, No edema, cyanosis or clubbing; +2 pulses bilaterally MSK: Ambulating independently with normal gait.  She does have slight  fullness along the lateral left ankle.  Assessment/ Plan: 75 y.o. female   Low hemoglobin - Plan: Hemoglobin, fingerstick, Anemia Profile B  Bursitis of left ankle  Her hemoglobin has dropped to 9.9.  Uncertain etiology.  No apparent blood loss.  Given history of iron deficiency anemia I am going to check an anemia profile with repeat CBC.  Hemodynamically she is stable.  We will plan pending evaluation.  However, if no significant vitamin deficiency would strongly consider further work-up to evaluate for indolent bleed.  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  Offered orthopedics but she declined this at this time  No orders of the defined types were placed in this encounter.  No orders of the defined types were placed in this encounter.    Mary Norlander, DO [place] [phone]

## 2020-10-07 LAB — ANEMIA PROFILE B
Basophils Absolute: 0.1 10*3/uL (ref 0.0–0.2)
Basos: 1 %
EOS (ABSOLUTE): 0.1 10*3/uL (ref 0.0–0.4)
Eos: 2 %
Ferritin: 33 ng/mL (ref 15–150)
Folate: 19 ng/mL (ref >3.0)
Hematocrit: 33.8 % — ABNORMAL LOW (ref 34.0–46.6)
Hemoglobin: 11.6 g/dL (ref 11.1–15.9)
Immature Grans (Abs): 0 10*3/uL (ref 0.0–0.1)
Immature Granulocytes: 0 %
Iron Saturation: 29 % (ref 15–55)
Iron: 110 ug/dL (ref 27–139)
Lymphocytes Absolute: 1.3 10*3/uL (ref 0.7–3.1)
Lymphs: 21 %
MCH: 31.5 pg (ref 26.6–33.0)
MCHC: 34.3 g/dL (ref 31.5–35.7)
MCV: 92 fL (ref 79–97)
Monocytes Absolute: 0.9 10*3/uL (ref 0.1–0.9)
Monocytes: 14 %
Neutrophils Absolute: 3.7 10*3/uL (ref 1.4–7.0)
Neutrophils: 62 %
Platelets: 220 10*3/uL (ref 150–450)
RBC: 3.68 x10E6/uL — ABNORMAL LOW (ref 3.77–5.28)
RDW: 12 % (ref 11.7–15.4)
Retic Ct Pct: 1.5 % (ref 0.6–2.6)
Total Iron Binding Capacity: 385 ug/dL (ref 250–450)
UIBC: 275 ug/dL (ref 118–369)
Vitamin B-12: 248 pg/mL (ref 232–1245)
WBC: 6 10*3/uL (ref 3.4–10.8)

## 2020-11-20 DIAGNOSIS — I10 Essential (primary) hypertension: Secondary | ICD-10-CM | POA: Diagnosis not present

## 2020-11-20 DIAGNOSIS — I480 Paroxysmal atrial fibrillation: Secondary | ICD-10-CM | POA: Diagnosis not present

## 2020-12-24 ENCOUNTER — Other Ambulatory Visit: Payer: Self-pay | Admitting: Family Medicine

## 2020-12-24 DIAGNOSIS — I10 Essential (primary) hypertension: Secondary | ICD-10-CM

## 2021-01-07 ENCOUNTER — Other Ambulatory Visit: Payer: Self-pay

## 2021-01-07 ENCOUNTER — Ambulatory Visit (INDEPENDENT_AMBULATORY_CARE_PROVIDER_SITE_OTHER): Payer: Medicare Other | Admitting: Family Medicine

## 2021-01-07 VITALS — BP 150/70 | HR 57 | Temp 98.1°F | Ht 63.0 in | Wt 110.2 lb

## 2021-01-07 DIAGNOSIS — Z658 Other specified problems related to psychosocial circumstances: Secondary | ICD-10-CM | POA: Diagnosis not present

## 2021-01-07 DIAGNOSIS — I480 Paroxysmal atrial fibrillation: Secondary | ICD-10-CM | POA: Diagnosis not present

## 2021-01-07 DIAGNOSIS — D649 Anemia, unspecified: Secondary | ICD-10-CM | POA: Diagnosis not present

## 2021-01-07 DIAGNOSIS — R4589 Other symptoms and signs involving emotional state: Secondary | ICD-10-CM

## 2021-01-07 LAB — CBC
Hematocrit: 32.8 % — ABNORMAL LOW (ref 34.0–46.6)
Hemoglobin: 11.6 g/dL (ref 11.1–15.9)
MCH: 31.5 pg (ref 26.6–33.0)
MCHC: 35.4 g/dL (ref 31.5–35.7)
MCV: 89 fL (ref 79–97)
Platelets: 229 10*3/uL (ref 150–450)
RBC: 3.68 x10E6/uL — ABNORMAL LOW (ref 3.77–5.28)
RDW: 12.1 % (ref 11.7–15.4)
WBC: 6.6 10*3/uL (ref 3.4–10.8)

## 2021-01-07 NOTE — Progress Notes (Signed)
Subjective: CC: Low hemoglobin PCP: Janora Norlander, DO [id number] Mary [name] is a 75 y.o. female presenting to clinic today for:  1.  Anemia Patient noted to have a lower hemoglobin than normal a couple of months ago.  Her subsequent check showed improvement.  She does not report any unusual symptoms or bleeding.  2.  Loneliness Patient admits to feeling lonely.  She often does not leave her home.  She does have a church that sometimes she will attend but it is about an hour away from her home.  She has a couple of friends but does not really spend much time with them.  She is very interested in getting involved in a social group locally but admits that she is very shy and is reluctant to do so on her own.   ROS: Per HPI  Allergies  Allergen Reactions   Fosamax [Alendronate Sodium]     Dizziness, elevated blood pressure   Sertraline Nausea Only    Insomnia, did not feel well   Sulfa Antibiotics Nausea Only   Past Medical History:  Diagnosis Date   Anxiety    Arthritis    Hypertension    Irregular heart beat     Current Outpatient Medications:    amLODipine (NORVASC) 5 MG tablet, TAKE 1 TABLET BY MOUTH  DAILY, Disp: 90 tablet, Rfl: 0   aspirin 325 MG tablet, Take 325 mg by mouth daily., Disp: , Rfl:    Cholecalciferol (VITAMIN D3) 125 MCG (5000 UT) CAPS, Take 5,000 Units by mouth., Disp: , Rfl:    flecainide (TAMBOCOR) 100 MG tablet, Take 100 mg by mouth 2 (two) times daily. , Disp: , Rfl:    irbesartan (AVAPRO) 300 MG tablet, Take 1 tablet (300 mg total) by mouth daily., Disp: 90 tablet, Rfl: 3   metoprolol succinate (TOPROL-XL) 50 MG 24 hr tablet, Take 50 mg by mouth daily. , Disp: , Rfl:    Multiple Vitamins-Minerals (MULTIVITAMIN WITH MINERALS) tablet, Take 1 tablet by mouth daily., Disp: , Rfl:    pravastatin (PRAVACHOL) 20 MG tablet, Take 1 tablet (20 mg total) by mouth daily., Disp: 90 tablet, Rfl: 3 Social History   Socioeconomic History   Marital status:  Widowed    Spouse name: Not on file   Number of children: 3   Years of education: 78   Highest education level: 12th grade  Occupational History   Occupation: retired  Tobacco Use   Smoking status: Never   Smokeless tobacco: Never  Vaping Use   Vaping Use: Never used  Substance and Sexual Activity   Alcohol use: Yes    Alcohol/week: 7.0 standard drinks    Types: 7 Glasses of wine per week   Drug use: No   Sexual activity: Not Currently  Other Topics Concern   Not on file  Social History Narrative   Not on file   Social Determinants of Health   Financial Resource Strain: Not on file  Food Insecurity: Not on file  Transportation Needs: Not on file  Physical Activity: Not on file  Stress: Not on file  Social Connections: Not on file  Intimate Partner Violence: Not on file   Family History  Problem Relation Age of Onset   Arthritis Mother    Heart disease Mother    Hypertension Mother    Hyperlipidemia Mother    Kidney disease Mother    Hypertension Brother    Stroke Maternal Grandmother    Heart disease Maternal Grandfather  Arthritis Paternal Grandmother     Objective: Office vital signs reviewed. BP (!) 150/70   Pulse (!) 57   Temp 98.1 F (36.7 C)   Ht '5\' 3"'$  (1.6 m)   Wt 110 lb 3.2 oz (50 kg)   SpO2 100%   BMI 19.52 kg/m   Physical Examination:  General: Awake, alert, No acute distress HEENT: Normal, sclera white Cardio: regular rate  Pulm:  normal work of breathing on room air Psych: Somewhat tearful.  Depression screen [facility] 2/9 01/07/2021 10/06/2020 09/18/2020  Decreased Interest 0 0 0  Down, Depressed, Hopeless 0 0 0  PHQ - 2 Score 0 0 0  Altered sleeping - - -  Tired, decreased energy - - -  Change in appetite - - -  Feeling bad or failure about yourself  - - -  Trouble concentrating - - -  Moving slowly or fidgety/restless - - -  Suicidal thoughts - - -  PHQ-9 Score - - -  Difficult doing work/chores - - -  Some recent data might be  hidden   GAD 7 : Generalized Anxiety Score 01/07/2021 10/06/2020 05/07/2020 02/05/2020  Nervous, Anxious, on Edge 0 0 0 2  Control/stop worrying 0 0 1 2  Worry too much - different things 0 0 1 2  Trouble relaxing 0 0 0 2  Restless 0 0 0 0  Easily annoyed or irritable 0 0 0 0  Afraid - awful might happen 0 0 0 0  Total GAD 7 Score 0 0 2 8  Anxiety Difficulty Not difficult at all - Not difficult at all Not difficult at all   ] Assessment/ Plan: 75 y.o. female   Low hemoglobin - Plan: CBC  Paroxysmal atrial fibrillation ([place]) - Plan: CBC  Loneliness  Check cbc to ensure stability.  Not having any bleeding.  I have given her [facility] therapy information and have also reached out to a few community resources.  There apparently is a women's group with [employer] in [place] that I am going to see we can connect her with.  No orders of the defined types were placed in this encounter.  No orders of the defined types were placed in this encounter.    Janora Norlander, DO [place] [phone]

## 2021-01-08 ENCOUNTER — Other Ambulatory Visit: Payer: Self-pay | Admitting: Family Medicine

## 2021-01-08 DIAGNOSIS — E785 Hyperlipidemia, unspecified: Secondary | ICD-10-CM

## 2021-01-08 DIAGNOSIS — I10 Essential (primary) hypertension: Secondary | ICD-10-CM

## 2021-01-08 NOTE — Telephone Encounter (Signed)
OV 09/01/20 rtc 1 yr

## 2021-01-29 ENCOUNTER — Other Ambulatory Visit: Payer: Self-pay | Admitting: Family Medicine

## 2021-01-29 DIAGNOSIS — Z1231 Encounter for screening mammogram for malignant neoplasm of breast: Secondary | ICD-10-CM

## 2021-02-13 ENCOUNTER — Other Ambulatory Visit: Payer: Self-pay

## 2021-02-13 ENCOUNTER — Encounter: Payer: Self-pay | Admitting: Family

## 2021-02-13 ENCOUNTER — Ambulatory Visit (INDEPENDENT_AMBULATORY_CARE_PROVIDER_SITE_OTHER): Payer: Medicare Other

## 2021-02-13 ENCOUNTER — Ambulatory Visit (INDEPENDENT_AMBULATORY_CARE_PROVIDER_SITE_OTHER): Payer: Medicare Other | Admitting: Family

## 2021-02-13 VITALS — BP 151/74 | HR 75 | Temp 98.1°F | Ht 63.0 in | Wt 111.0 lb

## 2021-02-13 DIAGNOSIS — Y92009 Unspecified place in unspecified non-institutional (private) residence as the place of occurrence of the external cause: Secondary | ICD-10-CM

## 2021-02-13 DIAGNOSIS — W19XXXA Unspecified fall, initial encounter: Secondary | ICD-10-CM

## 2021-02-13 DIAGNOSIS — R220 Localized swelling, mass and lump, head: Secondary | ICD-10-CM

## 2021-02-13 DIAGNOSIS — I480 Paroxysmal atrial fibrillation: Secondary | ICD-10-CM

## 2021-02-13 DIAGNOSIS — R55 Syncope and collapse: Secondary | ICD-10-CM | POA: Diagnosis not present

## 2021-02-13 LAB — CMP14+EGFR
ALT: 13 IU/L (ref 0–32)
AST: 21 IU/L (ref 0–40)
Albumin/Globulin Ratio: 2.8 — ABNORMAL HIGH (ref 1.2–2.2)
Albumin: 4.8 g/dL — ABNORMAL HIGH (ref 3.7–4.7)
Alkaline Phosphatase: 78 IU/L (ref 44–121)
BUN/Creatinine Ratio: 15 (ref 12–28)
BUN: 8 mg/dL (ref 8–27)
Bilirubin Total: 0.6 mg/dL (ref 0.0–1.2)
CO2: 23 mmol/L (ref 20–29)
Calcium: 9.1 mg/dL (ref 8.7–10.3)
Chloride: 92 mmol/L — ABNORMAL LOW (ref 96–106)
Creatinine, Ser: 0.55 mg/dL — ABNORMAL LOW (ref 0.57–1.00)
Globulin, Total: 1.7 g/dL (ref 1.5–4.5)
Glucose: 104 mg/dL — ABNORMAL HIGH (ref 70–99)
Potassium: 3.8 mmol/L (ref 3.5–5.2)
Sodium: 131 mmol/L — ABNORMAL LOW (ref 134–144)
Total Protein: 6.5 g/dL (ref 6.0–8.5)
eGFR: 96 mL/min/{1.73_m2} (ref >59)

## 2021-02-13 LAB — CBC WITH DIFFERENTIAL/PLATELET
Basophils Absolute: 0.1 10*3/uL (ref 0.0–0.2)
Basos: 1 %
EOS (ABSOLUTE): 0 10*3/uL (ref 0.0–0.4)
Eos: 0 %
Hematocrit: 33.8 % — ABNORMAL LOW (ref 34.0–46.6)
Hemoglobin: 11.7 g/dL (ref 11.1–15.9)
Immature Grans (Abs): 0 10*3/uL (ref 0.0–0.1)
Immature Granulocytes: 0 %
Lymphocytes Absolute: 1.1 10*3/uL (ref 0.7–3.1)
Lymphs: 14 %
MCH: 31.9 pg (ref 26.6–33.0)
MCHC: 34.6 g/dL (ref 31.5–35.7)
MCV: 92 fL (ref 79–97)
Monocytes Absolute: 1.1 10*3/uL — ABNORMAL HIGH (ref 0.1–0.9)
Monocytes: 15 %
Neutrophils Absolute: 5.2 10*3/uL (ref 1.4–7.0)
Neutrophils: 70 %
Platelets: 270 10*3/uL (ref 150–450)
RBC: 3.67 x10E6/uL — ABNORMAL LOW (ref 3.77–5.28)
RDW: 12.1 % (ref 11.7–15.4)
WBC: 7.4 10*3/uL (ref 3.4–10.8)

## 2021-02-13 NOTE — Progress Notes (Signed)
Subjective:    Patient ID: Mary [name], female    DOB: [birth year], 75 y.o.   MRN: [id number]  Chief Complaint  Patient presents with   blacked out     Does not remember nothing lives alone she fell in bathroom floor it face does not remember nothing    PT presents to the office today after a fall. She reports last night around 7pm she went to the bathroom. She can not remember anything after that except walking back and her face was swollen.   She has moderate swelling on her left side of her face, left rib, and left knee.   She has hx of A Fib and is followed by Cardiologists. She reports her Cardiologists is retiring and needs a new referral.  Loss of Consciousness This is a new problem. The current episode started yesterday. The problem occurs constantly. The problem has been unchanged. Pertinent negatives include no back pain, bladder incontinence, bowel incontinence, clumsiness, confusion, dizziness, headaches, malaise/fatigue, nausea, palpitations, slurred speech, visual change, vomiting or weakness. She has tried acetaminophen for the symptoms. The treatment provided no relief.     Review of Systems  Constitutional:  Negative for malaise/fatigue.  Cardiovascular:  Positive for syncope. Negative for palpitations.  Gastrointestinal:  Negative for bowel incontinence, nausea and vomiting.  Genitourinary:  Negative for bladder incontinence.  Musculoskeletal:  Negative for back pain.  Neurological:  Negative for dizziness, weakness and headaches.  Psychiatric/Behavioral:  Negative for confusion.   All other systems reviewed and are negative.     Objective:   Physical Exam Vitals reviewed.  Constitutional:      General: She is not in acute distress.    Appearance: She is well-developed.  HENT:     Head: Normocephalic and atraumatic.     Right Ear: Tympanic membrane normal.     Left Ear: Tympanic membrane normal.  Eyes:     Pupils: Pupils are equal, round, and reactive  to light.     Comments: Left eye swelling and bruising   Neck:     Thyroid: No thyromegaly.  Cardiovascular:     Rate and Rhythm: Normal rate and regular rhythm.     Heart sounds: Normal heart sounds. No murmur heard. Pulmonary:     Effort: Pulmonary effort is normal. No respiratory distress.     Breath sounds: Normal breath sounds. No wheezing.  Abdominal:     General: Bowel sounds are normal. There is no distension.     Palpations: Abdomen is soft.     Tenderness: There is no abdominal tenderness.  Musculoskeletal:        General: Swelling present. No tenderness. Normal range of motion.     Cervical back: Normal range of motion and neck supple.  Skin:    General: Skin is warm and dry.  Neurological:     Mental Status: She is alert and oriented to person, place, and time.     Cranial Nerves: No cranial nerve deficit.     Deep Tendon Reflexes: Reflexes are normal and symmetric.  Psychiatric:        Behavior: Behavior normal.        Thought Content: Thought content normal.        Judgment: Judgment normal.       BP (!) 151/74   Pulse 75   Temp 98.1 F (36.7 C) (Temporal)   Ht '5\' 3"'  (1.6 m)   Wt 111 lb (50.3 kg)   BMI 19.66 kg/m  Assessment & Plan:  Mary [name] comes in today with chief complaint of blacked out  (Does not remember nothing lives alone she fell in bathroom floor it face does not remember nothing )   Diagnosis and orders addressed:  1. Syncope, unspecified syncope type Unsure why she fell. Labs pending. EKG stable.  - EKG 12-Lead - CMP14+EGFR - CBC with Differential/Platelet - Ambulatory referral to Cardiology  2. Facial swelling - DG Facial Bones Complete; Future - CMP14+EGFR - CBC with Differential/Platelet  3. Paroxysmal atrial fibrillation (HCC) - CMP14+EGFR - CBC with Differential/Platelet - Ambulatory referral to Cardiology   4. Fall in home, initial encounter Discussed if any changes in gait, speech, memory, headache go to  ED. Has been 16 hours since fall.   Labs pending Health Maintenance reviewed Diet and exercise encouraged  Follow up plan: Keep follow up with PCP   Evelina Dun, FNP

## 2021-02-13 NOTE — Patient Instructions (Signed)
Syncope Syncope refers to a condition in which a person temporarily loses consciousness. Syncope may also be called fainting or passing out. It is caused by a sudden decrease in blood flow to the brain. Even though most causes of syncope are not dangerous, syncope can be a sign of a serious medical problem. Your health care provider may do tests to find the reason why you are having syncope. Signs that you may be about to faint include: Feeling dizzy or light-headed. Feeling nauseous. Seeing all white or all black in your field of vision. Having cold, clammy skin. If you faint, get medical help right away. Call your local emergency services (911 in the [place] Do not drive yourself to the hospital. Follow these instructions at home: Pay attention to any changes in your symptoms. Take these actions to stay safe and to help relieve your symptoms: Lifestyle Do not drive, use machinery, or play sports until your health care provider says it is okay. Do not drink alcohol. Do not use any products that contain nicotine or tobacco, such as cigarettes and e-cigarettes. If you need help quitting, ask your health care provider. Drink enough fluid to keep your urine pale yellow. General instructions Take over-the-counter and prescription medicines only as told by your health care provider. If you are taking blood pressure or heart medicine, get up slowly and take several minutes to sit and then stand. This can reduce dizziness or light-headedness. Have someone stay with you until you feel stable. If you start to feel like you might faint, lie down right away and raise (elevate) your feet above the level of your heart. Breathe deeply and steadily. Wait until all the symptoms have passed. Keep all follow-up visits as told by your health care provider. This is important. Get help right away if you: Have a severe headache. Faint once or repeatedly. Have pain in your chest, abdomen, or back. Have a very fast  or irregular heartbeat (palpitations). Have pain when you breathe. Are bleeding from your mouth or rectum, or you have black or tarry stool. Have a seizure. Are confused. Have trouble walking. Have severe weakness. Have vision problems. These symptoms may represent a serious problem that is an emergency. Do not wait to see if your symptoms will go away. Get medical help right away. Call your local emergency services (911 in the [place] Do not drive yourself to the hospital. Summary Syncope refers to a condition in which a person temporarily loses consciousness. It is caused by a sudden decrease in blood flow to the brain. Signs that you may be about to faint include dizziness, feeling light-headed, feeling nauseous, sudden vision changes, or cold, clammy skin. Although most causes of syncope are not dangerous, syncope can be a sign of a serious medical problem. If you faint, get medical help right away. This information is not intended to replace advice given to you by your health care provider. Make sure you discuss any questions you have with your health care provider. Document Revised: 07/24/2019 Document Reviewed: 08/23/2019 Elsevier Patient Education  [place].

## 2021-02-17 ENCOUNTER — Telehealth: Payer: Self-pay | Admitting: Family Medicine

## 2021-02-17 DIAGNOSIS — Y92009 Unspecified place in unspecified non-institutional (private) residence as the place of occurrence of the external cause: Secondary | ICD-10-CM

## 2021-02-17 DIAGNOSIS — R220 Localized swelling, mass and lump, head: Secondary | ICD-10-CM

## 2021-02-17 DIAGNOSIS — W19XXXA Unspecified fall, initial encounter: Secondary | ICD-10-CM

## 2021-02-17 NOTE — Telephone Encounter (Signed)
Pt called stating that she recently had a visit with Evelina Dun and was told that if she developed any kind of symptoms that would need her to have a CT scan, to call and let us know.  Pt says she is having numbness in her LT arm which is the side she fell on and would like to have CT scan done just to be sure.  Please advise and call patient.

## 2021-02-17 NOTE — Telephone Encounter (Signed)
Ct scan ordered.

## 2021-02-26 ENCOUNTER — Other Ambulatory Visit: Payer: Self-pay | Admitting: Family

## 2021-02-26 DIAGNOSIS — S0993XD Unspecified injury of face, subsequent encounter: Secondary | ICD-10-CM

## 2021-03-16 ENCOUNTER — Telehealth: Payer: Self-pay | Admitting: Family Medicine

## 2021-03-16 NOTE — Telephone Encounter (Signed)
Patient aware and verbalized understanding. °

## 2021-03-16 NOTE — Telephone Encounter (Signed)
Looks like it was ordered by [employer]. I reviewed her note.  Given that she was seen for syncope, I would prefer someone drive her to scan.

## 2021-03-17 ENCOUNTER — Other Ambulatory Visit: Payer: Self-pay | Admitting: Family Medicine

## 2021-03-17 DIAGNOSIS — I10 Essential (primary) hypertension: Secondary | ICD-10-CM

## 2021-03-20 ENCOUNTER — Ambulatory Visit (HOSPITAL_COMMUNITY)
Admission: RE | Admit: 2021-03-20 | Discharge: 2021-03-20 | Disposition: A | Discharge status: Home / Self Care | Payer: Medicare Other | Source: Ambulatory Visit | Attending: Family | Admitting: Family

## 2021-03-20 ENCOUNTER — Other Ambulatory Visit: Payer: Self-pay

## 2021-03-20 DIAGNOSIS — S0083XA Contusion of other part of head, initial encounter: Secondary | ICD-10-CM | POA: Diagnosis not present

## 2021-03-20 DIAGNOSIS — S0993XD Unspecified injury of face, subsequent encounter: Secondary | ICD-10-CM | POA: Diagnosis not present

## 2021-03-20 DIAGNOSIS — S0993XA Unspecified injury of face, initial encounter: Secondary | ICD-10-CM | POA: Diagnosis not present

## 2021-03-20 DIAGNOSIS — J341 Cyst and mucocele of nose and nasal sinus: Secondary | ICD-10-CM | POA: Diagnosis not present

## 2021-03-25 ENCOUNTER — Ambulatory Visit
Admission: RE | Admit: 2021-03-25 | Discharge: 2021-03-25 | Disposition: A | Discharge status: Home / Self Care | Payer: Medicare Other | Source: Ambulatory Visit | Attending: Family Medicine | Admitting: Family Medicine

## 2021-03-25 ENCOUNTER — Other Ambulatory Visit: Payer: Self-pay

## 2021-03-25 DIAGNOSIS — Z1231 Encounter for screening mammogram for malignant neoplasm of breast: Secondary | ICD-10-CM | POA: Diagnosis not present

## 2021-04-10 ENCOUNTER — Ambulatory Visit (INDEPENDENT_AMBULATORY_CARE_PROVIDER_SITE_OTHER): Payer: Medicare Other | Admitting: Family Medicine

## 2021-04-10 ENCOUNTER — Encounter: Payer: Self-pay | Admitting: Family Medicine

## 2021-04-10 VITALS — BP 152/74 | HR 64 | Temp 97.8°F | Ht 63.0 in | Wt 108.4 lb

## 2021-04-10 DIAGNOSIS — Z862 Personal history of diseases of the blood and blood-forming organs and certain disorders involving the immune mechanism: Secondary | ICD-10-CM

## 2021-04-10 DIAGNOSIS — Z23 Encounter for immunization: Secondary | ICD-10-CM | POA: Diagnosis not present

## 2021-04-10 DIAGNOSIS — Z87898 Personal history of other specified conditions: Secondary | ICD-10-CM | POA: Diagnosis not present

## 2021-04-10 DIAGNOSIS — I480 Paroxysmal atrial fibrillation: Secondary | ICD-10-CM

## 2021-04-10 NOTE — Patient Instructions (Signed)
Ancipitate Dr Percival Spanish will want to do some heart monitoring given your recent passing out spell

## 2021-04-10 NOTE — Progress Notes (Signed)
Subjective: CC: Recent syncopal episode PCP: Mary Norlander, DO Mary [name] is a 75 y.o. female presenting to clinic today for:  1.  Syncope Patient experienced an episode of syncope at the end of October.  She had subsequent facial injuries that required CT of her orbits.  There was no evidence of recent or acute fracture.  She still has some mild soft tissue swelling along the left cheek but otherwise seems to be healing without difficulty.  She is not sure as to why she had this syncopal episode and reports that she does not really recall falling nor getting up.  She will be seeing her cardiologist, Dr. Percival Spanish who will be new to her, after the first of the year since her other cardiologist in [place] has retired.  She reports compliance with her Tambocor, metoprolol, Pravachol and Norvasc for blood pressure, cholesterol and atrial fibrillation.  Denies any GI bleeding.  She is compliant with the ferrous sulfate for anemia noted on previous lab work.  Denies any heart palpitations, dizziness, change in exercise tolerance   ROS: Per HPI  Allergies  Allergen Reactions   Fosamax [Alendronate Sodium]     Dizziness, elevated blood pressure   Sertraline Nausea Only    Insomnia, did not feel well   Sulfa Antibiotics Nausea Only   Past Medical History:  Diagnosis Date   Anxiety    Arthritis    Hypertension    Irregular heart beat     Current Outpatient Medications:    amLODipine (NORVASC) 5 MG tablet, TAKE 1 TABLET BY MOUTH  DAILY, Disp: 90 tablet, Rfl: 0   aspirin 325 MG tablet, Take 325 mg by mouth daily., Disp: , Rfl:    Cholecalciferol (VITAMIN D3) 125 MCG (5000 UT) CAPS, Take 5,000 Units by mouth., Disp: , Rfl:    ferrous sulfate 325 (65 FE) MG tablet, Take 325 mg by mouth daily with breakfast., Disp: , Rfl:    flecainide (TAMBOCOR) 100 MG tablet, Take 100 mg by mouth 2 (two) times daily. , Disp: , Rfl:    irbesartan (AVAPRO) 300 MG tablet, TAKE 1 TABLET BY  MOUTH  DAILY, Disp: 90 tablet, Rfl: 1   metoprolol succinate (TOPROL-XL) 50 MG 24 hr tablet, Take 50 mg by mouth daily. , Disp: , Rfl:    Multiple Vitamins-Minerals (MULTIVITAMIN WITH MINERALS) tablet, Take 1 tablet by mouth daily., Disp: , Rfl:    pravastatin (PRAVACHOL) 20 MG tablet, TAKE 1 TABLET BY MOUTH  DAILY, Disp: 90 tablet, Rfl: 1 Social History   Socioeconomic History   Marital status: Widowed    Spouse name: Not on file   Number of children: 3   Years of education: 21   Highest education level: 12th grade  Occupational History   Occupation: retired  Tobacco Use   Smoking status: Never   Smokeless tobacco: Never  Vaping Use   Vaping Use: Never used  Substance and Sexual Activity   Alcohol use: Yes    Alcohol/week: 7.0 standard drinks    Types: 7 Glasses of wine per week   Drug use: No   Sexual activity: Not Currently  Other Topics Concern   Not on file  Social History Narrative   Not on file   Social Determinants of Health   Financial Resource Strain: Not on file  Food Insecurity: Not on file  Transportation Needs: Not on file  Physical Activity: Not on file  Stress: Not on file  Social Connections: Not on file  Intimate  Partner Violence: Not on file   Family History  Problem Relation Age of Onset   Arthritis Mother    Heart disease Mother    Hypertension Mother    Hyperlipidemia Mother    Kidney disease Mother    Stroke Maternal Grandmother    Heart disease Maternal Grandfather    Arthritis Paternal Grandmother    Hypertension Brother    Breast cancer Neg Hx     Objective: Office vital signs reviewed. BP (!) 152/74    Pulse 64    Temp 97.8 F (36.6 C)    Ht 5\' 3"  (1.6 m)    Wt 108 lb 6.4 oz (49.2 kg)    SpO2 98%    BMI 19.20 kg/m   Physical Examination:  General: Awake, alert, well nourished, No acute distress HEENT: She does still have some slight ecchymosis and facial swelling noted along the left cheek.   Cardio: regular rate and rhythm,  S1S2 heard, no murmurs appreciated Pulm: clear to auscultation bilaterally, no wheezes, rhonchi or rales; normal work of breathing on room air Extremities: warm, well perfused, No edema, cyanosis or clubbing; +0 pulses bilaterally MSK: Normal gait and station Neuro: No focal neurologic deficits  Assessment/ Plan: 75 y.o. female   Paroxysmal A-fib ([place])  Need for immunization against influenza - Plan: Flu Vaccine QUAD High Dose(Fluad)  History of syncope  History of anemia - Plan: CBC  Her A. fib seems to be both rate and rhythm controlled today.  I am quite concerned however about this history of syncope at the end of October.  I wonder if she would benefit from a Zio patch.  She will be seeing her new endocrinologist soon so perhaps they might consider this since she really has no recollection of the syncopal episode.  Alternate diagnoses considered include seizure and post-mictural syncope.  However given her atrial fibrillation diagnosis may be worth a second look.  I reviewed her labs from October which showed stable hemoglobin.  Given absence of bleeding and compliance with iron this was not repeated today but I have placed a standing order for CBC should she have any concerns going forward  Her influenza vaccination was administered today  Orders Placed This Encounter  Procedures   Flu Vaccine QUAD High Dose(Fluad)   No orders of the defined types were placed in this encounter.    Mary Norlander, DO [place] [phone]

## 2021-04-28 DIAGNOSIS — R55 Syncope and collapse: Secondary | ICD-10-CM | POA: Insufficient documentation

## 2021-04-28 NOTE — Progress Notes (Signed)
Cardiology Office Note   Date:  04/29/2021   ID:  Mary [name], DOB August 10, 1945, MRN [id number]  PCP:  Janora Norlander, DO  Cardiologist:   None Referring:  Janora Norlander, DO  Chief Complaint  Patient presents with   Loss of Consciousness      History of Present Illness: Mary [name] is a 76 y.o. female who presents for evaluation of syncope.     She saw a cardiologist who has since retired.   She has a history of atrial fib.  This has been paroxysmal and is treated with flecainide.   She moved here from [place] with a diagnosis of atrial fibrillation already.  She said she had been on Eliquis at one point but she took her self off of it.  Aside from the beginning when she was diagnosed and treated with flecainide I do not see that she has been in atrial fibrillation since then.  She says maybe she gets occasional brief palpitations.  He does see that she had a negative stress echocardiogram in 2019.    She said that on October 20 she had a syncopal episode.  She was running around doing some getting ready to take a trip when she apparently suddenly passed out.  It was unwitnessed.  She lives by her self.  She had no prodrome.  She got up and she had trauma to the side of her face.  She went to the doctor next day.  Prior to this she had no prior syncope other than when she was pregnant.  She has not had any since then.  She has not felt any palpitations.  She denies any orthostatic symptoms.  Has had no chest pressure, neck or arm discomfort.  She has no bleeding or edema.      Past Medical History:  Diagnosis Date   Anxiety    Arthritis    Hypertension    Irregular heart beat     Past Surgical History:  Procedure Laterality Date   ABDOMINAL HYSTERECTOMY     KNEE ARTHROSCOPY     TONSILLECTOMY       Current Outpatient Medications  Medication Sig Dispense Refill   amLODipine (NORVASC) 5 MG tablet TAKE 1 TABLET BY MOUTH  DAILY 90 tablet 0   aspirin  325 MG tablet Take 325 mg by mouth daily.     Cholecalciferol (VITAMIN D3) 125 MCG (5000 UT) CAPS Take 5,000 Units by mouth.     ferrous sulfate 325 (65 FE) MG tablet Take 325 mg by mouth daily with breakfast.     flecainide (TAMBOCOR) 100 MG tablet Take 100 mg by mouth 2 (two) times daily.      irbesartan (AVAPRO) 300 MG tablet TAKE 1 TABLET BY MOUTH  DAILY 90 tablet 1   metoprolol succinate (TOPROL-XL) 50 MG 24 hr tablet Take 50 mg by mouth daily.      Multiple Vitamins-Minerals (MULTIVITAMIN WITH MINERALS) tablet Take 1 tablet by mouth daily.     pravastatin (PRAVACHOL) 20 MG tablet TAKE 1 TABLET BY MOUTH  DAILY 90 tablet 1   No current facility-administered medications for this visit.    Allergies:   Fosamax [alendronate sodium], Sertraline, and Sulfa antibiotics    Social History:  The patient  reports that she has never smoked. She has never used smokeless tobacco. She reports current alcohol use of about 7.0 standard drinks per week. She reports that she does not use drugs.   Family History:  The patient's family history includes Arthritis in her mother and paternal grandmother; CAD (age of onset: 35) in her mother; Heart disease in her maternal grandfather; Hyperlipidemia in her mother; Hypertension in her brother and mother; Kidney disease in her mother; Stroke in her maternal grandmother.    ROS:  Please see the history of present illness.   Otherwise, review of systems are positive for none.   All other systems are reviewed and negative.    PHYSICAL EXAM: VS:  BP 126/76    Pulse 65    Ht 5' 3.5" (1.613 m)    Wt 111 lb (50.3 kg)    SpO2 99%    BMI 19.35 kg/m  , BMI Body mass index is 19.35 kg/m. GENERAL:  Well appearing HEENT:  Pupils equal round and reactive, fundi not visualized, oral mucosa unremarkable NECK:  No jugular venous distention, waveform within normal limits, carotid upstroke brisk and symmetric, no bruits, no thyromegaly LYMPHATICS:  No cervical, inguinal  adenopathy LUNGS:  Clear to auscultation bilaterally BACK:  No CVA tenderness CHEST:  Unremarkable HEART:  PMI not displaced or sustained,S1 and S2 within normal limits, no S3, no S4, no clicks, no rubs, no murmurs ABD:  Flat, positive bowel sounds normal in frequency in pitch, no bruits, no rebound, no guarding, no midline pulsatile mass, no hepatomegaly, no splenomegaly EXT:  2 plus pulses throughout, no edema, no cyanosis no clubbing SKIN:  No rashes no nodules NEURO:  Cranial nerves II through XII grossly intact, motor grossly intact throughout PSYCH:  Cognitively intact, oriented to person place and time    EKG:  EKG is not ordered today. The ekg ordered 02/13/2021 demonstrates sinus rhythm, rate 76, axis within normal limits, first-degree AV block, poor anterior R wave progression, no acute ST-T wave changes.   Recent Labs: 09/01/2020: TSH 2.460 02/13/2021: ALT 13; BUN 8; Creatinine, Ser 0.55; Hemoglobin 11.7; Platelets 270; Potassium 3.8; Sodium 131    Lipid Panel    Component Value Date/Time   CHOL 151 09/01/2020 1121   TRIG 26 09/01/2020 1121   HDL 101 09/01/2020 1121   CHOLHDL 1.5 09/01/2020 1121   LDLCALC 42 09/01/2020 1121      Wt Readings from Last 3 Encounters:  04/29/21 111 lb (50.3 kg)  04/10/21 108 lb 6.4 oz (49.2 kg)  02/13/21 111 lb (50.3 kg)      Other studies Reviewed: Additional studies/ records that were reviewed today include: [facility] Records. Review of the above records demonstrates:  Please see elsewhere in the note.     ASSESSMENT AND PLAN:  PAF: Suspected that she has been having this.  However, I am going to be screening as below.  She has not wanted to take a DOAC.  Further management based on the monitor.  HTN: Her blood pressure is controlled.  Change in therapy.  SYNCOPE: I am concerned that this is a cardiac etiology and will start for monitor. She has been advised that she should not be driving.     Current medicines are  reviewed at length with the patient today.  The patient does not have concerns regarding medicines.  The following changes have been made:  no change  Labs/ tests ordered today include:   Orders Placed This Encounter  Procedures   CARDIAC EVENT MONITOR     Disposition:   FU with me after the monitor.      Signed, Minus Breeding, MD  04/29/2021 2:41 PM    [facility]

## 2021-04-29 ENCOUNTER — Ambulatory Visit (INDEPENDENT_AMBULATORY_CARE_PROVIDER_SITE_OTHER): Payer: Medicare Other | Admitting: Cardiology

## 2021-04-29 ENCOUNTER — Other Ambulatory Visit: Payer: Self-pay

## 2021-04-29 ENCOUNTER — Encounter: Payer: Self-pay | Admitting: Cardiology

## 2021-04-29 VITALS — BP 126/76 | HR 65 | Ht 63.5 in | Wt 111.0 lb

## 2021-04-29 DIAGNOSIS — R55 Syncope and collapse: Secondary | ICD-10-CM

## 2021-04-29 DIAGNOSIS — I480 Paroxysmal atrial fibrillation: Secondary | ICD-10-CM

## 2021-04-29 DIAGNOSIS — I10 Essential (primary) hypertension: Secondary | ICD-10-CM | POA: Diagnosis not present

## 2021-04-29 NOTE — Patient Instructions (Signed)
Medication Instructions:  The current medical regimen is effective;  continue present plan and medications.  *If you need a refill on your cardiac medications before your next appointment, please call your pharmacy*  Testing/Procedures: Your physician has recommended that you wear an event monitor for 30 days. Event monitors are medical devices that record the hearts electrical activity. Doctors most often [place] these monitors to diagnose arrhythmias. Arrhythmias are problems with the speed or rhythm of the heartbeat. The monitor is a small, portable device. You can wear one while you do your normal daily activities. This is usually used to diagnose what is causing palpitations/syncope (passing out).  Follow-Up: At [facility], you and your health needs are our priority.  As part of our continuing mission to provide you with exceptional heart care, we have created designated Provider Care Teams.  These Care Teams include your primary Cardiologist (physician) and Advanced Practice Providers (APPs -  Physician Assistants and Nurse Practitioners) who all work together to provide you with the care you need, when you need it.  We recommend signing up for the patient portal called "MyChart".  Sign up information is provided on this After Visit Summary.  MyChart is used to connect with patients for Virtual Visits (Telemedicine).  Patients are able to view lab/test results, encounter notes, upcoming appointments, etc.  Non-urgent messages can be sent to your provider as well.   To learn more about what you can do with MyChart, go to [email or web address].    Your next appointment:   2 month(s)  The format for your next appointment:   In Person  Provider:   Minus Breeding, MD   Thank you for choosing [facility]!!

## 2021-05-05 ENCOUNTER — Ambulatory Visit (INDEPENDENT_AMBULATORY_CARE_PROVIDER_SITE_OTHER): Payer: Medicare Other

## 2021-05-05 DIAGNOSIS — R55 Syncope and collapse: Secondary | ICD-10-CM | POA: Diagnosis not present

## 2021-05-05 DIAGNOSIS — I480 Paroxysmal atrial fibrillation: Secondary | ICD-10-CM | POA: Diagnosis not present

## 2021-05-06 ENCOUNTER — Telehealth: Payer: Self-pay | Admitting: Cardiology

## 2021-05-06 NOTE — Telephone Encounter (Signed)
Forward note to [facility] monitoring dept-  to follow up with patient

## 2021-05-06 NOTE — Telephone Encounter (Signed)
Patient has poor network service for the cell phone used to transmit her monitor data to [employer].  [employer] did reboot her monitor without improvement.  Informed patient that her monitor would still record her heart rate and rhythm data, but would not transmit the data to [employer] until she travelled to any area with network service.  Suggested she travel to an area with network service occasionally and prior to her EOS date of 06/03/21 so monitor could transmit any data to [employer].  [employer] will process recorders upon return to scan for any data not transmitted. Cell phone will need to be charged more frequently because battery will deplete from phone attempting to transmit multiple times.

## 2021-05-06 NOTE — Telephone Encounter (Signed)
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 She wanted to ask what she needs to do

## 2021-05-19 ENCOUNTER — Telehealth: Payer: Self-pay | Admitting: *Deleted

## 2021-05-19 NOTE — Telephone Encounter (Signed)
Patient having issues with cardiac event monitor.  Monitor keeps reading poor skin contact.  Her skin is raw and irritated so the strip is not sticking well.  Patient called [employer] and they are sending her sensitive skin or hydrocolloid strips.  Instructed patient to take monitor off today for a couple of days to allow her skin to heal before applying sensitvie skin electrodes.  Patient will turn off both monitor batteries and charge.  Patient will put phone on do not disturb.  Mitch from [employer] to request 3 day extension to allow new supplies to reach patient.

## 2021-05-27 ENCOUNTER — Ambulatory Visit (INDEPENDENT_AMBULATORY_CARE_PROVIDER_SITE_OTHER): Payer: Medicare Other

## 2021-05-27 VITALS — Wt 110.0 lb

## 2021-05-27 DIAGNOSIS — Z0000 Encounter for general adult medical examination without abnormal findings: Secondary | ICD-10-CM

## 2021-05-27 NOTE — Progress Notes (Signed)
Subjective:   Mary [name] is a 76 y.o. female who presents for Medicare Annual (Subsequent) preventive examination.  Virtual Visit via Telephone Note  I connected with  [name] on 05/27/21 at  2:45 PM EST by telephone and verified that I am speaking with the correct person using two identifiers.  Location: Patient: Home Provider: [facility] Persons participating in the virtual visit: patient/Nurse Health Advisor   I discussed the limitations, risks, security and privacy concerns of performing an evaluation and management service by telephone and the availability of in person appointments. The patient expressed understanding and agreed to proceed.  Interactive audio and video telecommunications were attempted between this nurse and patient, however failed, due to patient having technical difficulties OR patient did not have access to video capability.  We continued and completed visit with audio only.  Some vital signs may be absent or patient reported.   Mary Yi E Atley Neubert, LPN   Review of Systems     Cardiac Risk Factors include: advanced age (>67men, >56 women);dyslipidemia;hypertension;Other (see comment), Risk factor comments: A.fib.     Objective:    Today's Vitals   05/27/21 1434 05/27/21 1436  Weight: 110 lb (49.9 kg)   PainSc:  3    Body mass index is 19.18 kg/m.  Advanced Directives 05/27/2021 05/26/2020 07/08/2019 01/22/2019 01/20/2018  Does Patient Have a Medical Advance Directive? Yes No No Yes Yes  Type of Paramedic of [place];Living will - - [place];Living will Living will;Healthcare Power of Attorney  Does patient want to make changes to medical advance directive? - - - No - Patient declined No - Patient declined  Copy of [place] in Chart? No - copy requested - - No - copy requested No - copy requested  Would patient like information on creating a medical advance directive? - No - Patient  declined No - Patient declined - -    Current Medications (verified) Outpatient Encounter Medications as of 05/27/2021  Medication Sig   amLODipine (NORVASC) 5 MG tablet TAKE 1 TABLET BY MOUTH  DAILY   aspirin 325 MG tablet Take 325 mg by mouth daily.   Cholecalciferol (VITAMIN D3) 125 MCG (5000 UT) CAPS Take 5,000 Units by mouth.   ferrous sulfate 325 (65 FE) MG tablet Take 325 mg by mouth daily with breakfast.   flecainide (TAMBOCOR) 100 MG tablet Take 100 mg by mouth 2 (two) times daily.    irbesartan (AVAPRO) 300 MG tablet TAKE 1 TABLET BY MOUTH  DAILY   metoprolol succinate (TOPROL-XL) 50 MG 24 hr tablet Take 50 mg by mouth daily.    Multiple Vitamins-Minerals (MULTIVITAMIN WITH MINERALS) tablet Take 1 tablet by mouth daily.   pravastatin (PRAVACHOL) 20 MG tablet TAKE 1 TABLET BY MOUTH  DAILY   No facility-administered encounter medications on file as of 05/27/2021.    Allergies (verified) Fosamax [alendronate sodium], Sertraline, and Sulfa antibiotics   History: Past Medical History:  Diagnosis Date   Anemia    Anxiety    Arthritis    Cataract    Hypertension    Irregular heart beat    Past Surgical History:  Procedure Laterality Date   ABDOMINAL HYSTERECTOMY     APPENDECTOMY     KNEE ARTHROSCOPY     TONSILLECTOMY     Family History  Problem Relation Age of Onset   Arthritis Mother    CAD Mother 71   Hypertension Mother    Hyperlipidemia Mother  Kidney disease Mother    Heart disease Mother    Hypertension Brother    Stroke Maternal Grandmother    Heart disease Maternal Grandfather    Arthritis Paternal Grandmother    Breast cancer Neg Hx    Social History   Socioeconomic History   Marital status: Widowed    Spouse name: Not on file   Number of children: 3   Years of education: 54   Highest education level: 12th grade  Occupational History   Occupation: retired  Tobacco Use   Smoking status: Never   Smokeless tobacco: Never  Vaping Use   Vaping  Use: Never used  Substance and Sexual Activity   Alcohol use: Yes    Alcohol/week: 7.0 standard drinks    Types: 7 Glasses of wine per week   Drug use: No   Sexual activity: Not Currently  Other Topics Concern   Not on file  Social History Narrative   Lives alone. Two sons and a daughter.  Widow.     Social Determinants of Health   Financial Resource Strain: Low Risk    Difficulty of Paying Living Expenses: Not hard at all  Food Insecurity: No Food Insecurity   Worried About Charity fundraiser in the Last Year: Never true   [place] in the Last Year: Never true  Transportation Needs: No Transportation Needs   Lack of Transportation (Medical): No   Lack of Transportation (Non-Medical): No  Physical Activity: Sufficiently Active   Days of Exercise per Week: 7 days   Minutes of Exercise per Session: 30 min  Stress: No Stress Concern Present   Feeling of Stress : Not at all  Social Connections: Moderately Integrated   Frequency of Communication with Friends and Family: More than three times a week   Frequency of Social Gatherings with Friends and Family: Once a week   Attends Religious Services: More than 4 times per year   Active Member of [employer] or Organizations: Yes   Attends Archivist Meetings: More than 4 times per year   Marital Status: Widowed    Tobacco Counseling Counseling given: Not Answered   Clinical Intake:  Pre-visit preparation completed: Yes  Pain : 0-10 Pain Score: 3  Pain Type: Chronic pain Pain Location: Generalized Pain Radiating Towards: joints Pain Descriptors / Indicators: Aching, Discomfort, Sore Pain Onset: More than a month ago Pain Frequency: Intermittent     BMI - recorded: 19.18 Nutritional Status: BMI of 19-24  Normal Nutritional Risks: None Diabetes: No  How often do you need to have someone help you when you read instructions, pamphlets, or other written materials from your doctor or pharmacy?: 1 -  Never  Diabetic? no  Interpreter Needed?: No  Information entered by :: Vivianne Carles, LPN   Activities of Daily Living In your present state of health, do you have any difficulty performing the following activities: 05/27/2021 05/26/2021  Hearing? N N  Vision? N N  Difficulty concentrating or making decisions? N N  Walking or climbing stairs? N N  Dressing or bathing? N N  Doing errands, shopping? N N  Preparing Food and eating ? N N  Using the Toilet? N N  In the past six months, have you accidently leaked urine? N N  Do you have problems with loss of bowel control? N N  Managing your Medications? N N  Managing your Finances? N N  Housekeeping or managing your Housekeeping? N N  Some recent data  might be hidden    Patient Care Team: Janora Norlander, DO as PCP - General (Family Medicine)  Indicate any recent Medical Services you may have received from other than [facility] providers in the past year (date may be approximate).     Assessment:   This is a routine wellness examination for Robie.  Hearing/Vision screen Hearing Screening - Comments:: Denies hearing difficulties   Vision Screening - Comments:: Wears otc reading glasses prn only - up to date with routine eye exams with [facility]  Dietary issues and exercise activities discussed: Current Exercise Habits: Home exercise routine, Type of exercise: walking;strength training/weights;stretching;yoga, Time (Minutes): 30, Frequency (Times/Week): 7, Weekly Exercise (Minutes/Week): 210, Intensity: Moderate, Exercise limited by: None identified   Goals Addressed             This Visit's Progress    DIET - INCREASE WATER INTAKE   On track    Try to drink 6-8 glasses of water daily.     Exercise 150 min/wk Moderate Activity   On track    Patient Stated   On track    05/27/2021 AWV Goal: Fall Prevention  Over the next year, patient will decrease their risk for falls by: Using assistive devices, such as a cane or  walker, as needed Identifying fall risks within their home and correcting them by: Removing throw rugs Adding handrails to stairs or ramps Removing clutter and keeping a clear pathway throughout the home Increasing light, especially at night Adding shower handles/bars Raising toilet seat Identifying potential personal risk factors for falls: Medication side effects Incontinence/urgency Vestibular dysfunction Hearing loss Musculoskeletal disorders Neurological disorders Orthostatic hypotension         Depression Screen PHQ 2/9 Scores 05/27/2021 04/10/2021 01/07/2021 10/06/2020 09/18/2020 09/03/2020 05/26/2020  PHQ - 2 Score 0 0 0 0 0 0 0  PHQ- 9 Score - - - - - - -    Fall Risk Fall Risk  05/27/2021 05/26/2021 04/10/2021 02/13/2021 01/07/2021  Falls in the past year? 1 1 1 1  0  Comment - - - - -  Number falls in past yr: 0 0 0 1 -  Injury with Fall? 0 0 1 1 -  Risk for fall due to : History of fall(s) - History of fall(s) Impaired balance/gait -  Follow up Falls prevention discussed - Education provided Education provided -  Comment - - - - -    FALL RISK PREVENTION PERTAINING TO THE HOME:  Any stairs in or around the home? Yes  If so, are there any without handrails? No  Home free of loose throw rugs in walkways, pet beds, electrical cords, etc? Yes  Adequate lighting in your home to reduce risk of falls? Yes   ASSISTIVE DEVICES UTILIZED TO PREVENT FALLS:  Life alert? No  Use of a cane, walker or w/c? No  Grab bars in the bathroom? Yes  Shower chair or bench in shower? Yes  Elevated toilet seat or a handicapped toilet? No   TIMED UP AND GO:  Was the test performed? No . Telephonic visit  Cognitive Function: Normal cognitive status assessed by direct observation by this Nurse Health Advisor. No abnormalities found.    MMSE - Mini Mental State Exam 01/20/2018  Orientation to time 5  Orientation to Place 5  Registration 3  Attention/ Calculation 5  Recall 3  Language-  name 2 objects 2  Language- repeat 1  Language- follow 3 step command 3  Language- read & follow direction  1  Write a sentence 1  Copy design 1  Total score 30     6CIT Screen 05/26/2020 01/22/2019  What Year? 0 points 0 points  What month? 0 points 0 points  What time? 0 points 0 points  Count back from 20 0 points 0 points  Months in reverse 0 points 0 points  Repeat phrase 2 points 2 points  Total Score 2 2    Immunizations Immunization History  Administered Date(s) Administered   Fluad Quad(high Dose 65+) 02/05/2020, 04/10/2021   Influenza Split 06/13/2013   Influenza, High Dose Seasonal PF 06/06/2012   Influenza, Seasonal, Injecte, Preservative Fre 03/31/2015   PFIZER(Purple Top)SARS-COV-2 Vaccination 11/30/2019, 12/21/2019   Pneumococcal Conjugate-13 06/13/2013   Pneumococcal Polysaccharide-23 04/17/2010   Pneumococcal-Unspecified 04/17/2010   Td 04/27/2003   Tdap 06/13/2013    TDAP status: Up to date  Flu Vaccine status: Up to date  Pneumococcal vaccine status: Up to date  Covid-19 vaccine status: Completed vaccines  Qualifies for Shingles Vaccine? Yes   Zostavax completed No   Shingrix Completed?: No.    Education has been provided regarding the importance of this vaccine. Patient has been advised to call insurance company to determine out of pocket expense if they have not yet received this vaccine. Advised may also receive vaccine at local pharmacy or Health Dept. Verbalized acceptance and understanding.  Screening Tests Health Maintenance  Topic Date Due   COVID-19 Vaccine (3 - Pfizer risk series) 01/18/2020   Zoster Vaccines- Shingrix (1 of 2) 07/09/2021 (Originally 02/18/1965)   MAMMOGRAM  03/25/2022   DEXA SCAN  05/08/2022   TETANUS/TDAP  06/14/2023   Pneumonia Vaccine 30+ Years old  Completed   INFLUENZA VACCINE  Completed   Hepatitis C Screening  Completed   HPV VACCINES  Aged Out   COLONOSCOPY (Pts 45-8yrs Insurance coverage will need to be  confirmed)  Discontinued    Health Maintenance  Health Maintenance Due  Topic Date Due   COVID-19 Vaccine (3 - Pfizer risk series) 01/18/2020    Colorectal cancer screening: Type of screening: Cologuard. Completed 2/8/220. Repeat every 3 years - thinks she doesn't need another since she is 76 yo - to discuss with PCP  Mammogram status: Completed 03/25/2021. Repeat every year  Bone Density status: Completed 05/08/2020. Results reflect: Bone density results: OSTEOPOROSIS. Repeat every 2 years.  Lung Cancer Screening: (Low Dose CT Chest recommended if Age 50-80 years, 30 pack-year currently smoking OR have quit w/in 15years.) does not qualify  Additional Screening:  Hepatitis C Screening: does qualify; Completed 04/28/2018  Vision Screening: Recommended annual ophthalmology exams for early detection of glaucoma and other disorders of the eye. Is the patient up to date with their annual eye exam?  Yes  Who is the provider or what is the name of the office in which the patient attends annual eye exams? [facility] If pt is not established with a provider, would they like to be referred to a provider to establish care? No .   Dental Screening: Recommended annual dental exams for proper oral hygiene  Community Resource Referral / Chronic Care Management: CRR required this visit?  No   CCM required this visit?  No      Plan:     I have personally reviewed and noted the following in the patients chart:   Medical and social history Use of alcohol, tobacco or illicit drugs  Current medications and supplements including opioid prescriptions.  Functional ability and status Nutritional status Physical  activity Advanced directives List of other physicians Hospitalizations, surgeries, and ER visits in previous 12 months Vitals Screenings to include cognitive, depression, and falls Referrals and appointments  In addition, I have reviewed and discussed with patient certain  preventive protocols, quality metrics, and best practice recommendations. A written personalized care plan for preventive services as well as general preventive health recommendations were provided to patient.     Sandrea Hammond, LPN   5/0/0370   Nurse Notes: None

## 2021-05-27 NOTE — Patient Instructions (Signed)
Mary [name] , Thank you for taking time to come for your Medicare Wellness Visit. I appreciate your ongoing commitment to your health goals. Please review the following plan we discussed and let me know if I can assist you in the future.   Screening recommendations/referrals: Colonoscopy: Cologuard done 06/03/2018 - ask Dr [name] if you need to repeat this (usually every 3 years) Mammogram: Done 03/25/2021 - Repeat annually  Bone Density: Done 05/08/2020 - Repeat every 2 years  Recommended yearly ophthalmology/optometry visit for glaucoma screening and checkup Recommended yearly dental visit for hygiene and checkup  Vaccinations: Influenza vaccine: Done 04/10/2021 - Repeat annually  Pneumococcal vaccine: Done 04/17/2010 & 06/13/2013 Tdap vaccine: Done 06/13/2013 - Repeat in 10 years Shingles vaccine: Due - get at next visit, then second dose 2-6 months later   Covid-19: Done 8/6/221 & 12/21/2019 - declines boosters  Advanced directives: Please bring a copy of your health care power of attorney and living will to the office to be added to your chart at your convenience.   Conditions/risks identified: Keep up the great work!   Next appointment: Follow up in one year for your annual wellness visit    Preventive Care 65 Years and Older, Female Preventive care refers to lifestyle choices and visits with your health care provider that can promote health and wellness. What does preventive care include? A yearly physical exam. This is also called an annual well check. Dental exams once or twice a year. Routine eye exams. Ask your health care provider how often you should have your eyes checked. Personal lifestyle choices, including: Daily care of your teeth and gums. Regular physical activity. Eating a healthy diet. Avoiding tobacco and drug use. Limiting alcohol use. Practicing safe sex. Taking low-dose aspirin every day. Taking vitamin and mineral supplements as recommended by your  health care provider. What happens during an annual well check? The services and screenings done by your health care provider during your annual well check will depend on your age, overall health, lifestyle risk factors, and family history of disease. Counseling  Your health care provider may ask you questions about your: Alcohol use. Tobacco use. Drug use. Emotional well-being. Home and relationship well-being. Sexual activity. Eating habits. History of falls. Memory and ability to understand (cognition). Work and work Statistician. Reproductive health. Screening  You may have the following tests or measurements: Height, weight, and BMI. Blood pressure. Lipid and cholesterol levels. These may be checked every 5 years, or more frequently if you are over 54 years old. Skin check. Lung cancer screening. You may have this screening every year starting at age 9 if you have a 30-pack-year history of smoking and currently smoke or have quit within the past 15 years. Fecal occult blood test (FOBT) of the stool. You may have this test every year starting at age 24. Flexible sigmoidoscopy or colonoscopy. You may have a sigmoidoscopy every 5 years or a colonoscopy every 10 years starting at age 13. Hepatitis C blood test. Hepatitis B blood test. Sexually transmitted disease (STD) testing. Diabetes screening. This is done by checking your blood sugar (glucose) after you have not eaten for a while (fasting). You may have this done every 1-3 years. Bone density scan. This is done to screen for osteoporosis. You may have this done starting at age 38. Mammogram. This may be done every 1-2 years. Talk to your health care provider about how often you should have regular mammograms. Talk with your health care provider about your test  results, treatment options, and if necessary, the need for more tests. Vaccines  Your health care provider may recommend certain vaccines, such as: Influenza vaccine.  This is recommended every year. Tetanus, diphtheria, and acellular pertussis (Tdap, Td) vaccine. You may need a Td booster every 10 years. Zoster vaccine. You may need this after age 43. Pneumococcal 13-valent conjugate (PCV13) vaccine. One dose is recommended after age 55. Pneumococcal polysaccharide (PPSV23) vaccine. One dose is recommended after age 45. Talk to your health care provider about which screenings and vaccines you need and how often you need them. This information is not intended to replace advice given to you by your health care provider. Make sure you discuss any questions you have with your health care provider. Document Released: 05/09/2015 Document Revised: 12/31/2015 Document Reviewed: 02/11/2015 Elsevier Interactive Patient Education  2017 [place] Prevention in the Home Falls can cause injuries. They can happen to people of all ages. There are many things you can do to make your home safe and to help prevent falls. What can I do on the outside of my home? Regularly fix the edges of walkways and driveways and fix any cracks. Remove anything that might make you trip as you walk through a door, such as a raised step or threshold. Trim any bushes or trees on the path to your home. Use bright outdoor lighting. Clear any walking paths of anything that might make someone trip, such as rocks or tools. Regularly check to see if handrails are loose or broken. Make sure that both sides of any steps have handrails. Any raised decks and porches should have guardrails on the edges. Have any leaves, snow, or ice cleared regularly. Use sand or salt on walking paths during winter. Clean up any spills in your garage right away. This includes oil or grease spills. What can I do in the bathroom? Use night lights. Install grab bars by the toilet and in the tub and shower. Do not use towel bars as grab bars. Use non-skid mats or decals in the tub or shower. If you need to sit  down in the shower, use a plastic, non-slip stool. Keep the floor dry. Clean up any water that spills on the floor as soon as it happens. Remove soap buildup in the tub or shower regularly. Attach bath mats securely with double-sided non-slip rug tape. Do not have throw rugs and other things on the floor that can make you trip. What can I do in the bedroom? Use night lights. Make sure that you have a light by your bed that is easy to reach. Do not use any sheets or blankets that are too big for your bed. They should not hang down onto the floor. Have a firm chair that has side arms. You can use this for support while you get dressed. Do not have throw rugs and other things on the floor that can make you trip. What can I do in the kitchen? Clean up any spills right away. Avoid walking on wet floors. Keep items that you use a lot in easy-to-reach places. If you need to reach something above you, use a strong step stool that has a grab bar. Keep electrical cords out of the way. Do not use floor polish or wax that makes floors slippery. If you must use wax, use non-skid floor wax. Do not have throw rugs and other things on the floor that can make you trip. What can I do with my stairs?  Do not leave any items on the stairs. Make sure that there are handrails on both sides of the stairs and use them. Fix handrails that are broken or loose. Make sure that handrails are as long as the stairways. Check any carpeting to make sure that it is firmly attached to the stairs. Fix any carpet that is loose or worn. Avoid having throw rugs at the top or bottom of the stairs. If you do have throw rugs, attach them to the floor with carpet tape. Make sure that you have a light switch at the top of the stairs and the bottom of the stairs. If you do not have them, ask someone to add them for you. What else can I do to help prevent falls? Wear shoes that: Do not have high heels. Have rubber bottoms. Are  comfortable and fit you well. Are closed at the toe. Do not wear sandals. If you use a stepladder: Make sure that it is fully opened. Do not climb a closed stepladder. Make sure that both sides of the stepladder are locked into place. Ask someone to hold it for you, if possible. Clearly mark and make sure that you can see: Any grab bars or handrails. First and last steps. Where the edge of each step is. Use tools that help you move around (mobility aids) if they are needed. These include: Canes. Walkers. Scooters. Crutches. Turn on the lights when you go into a dark area. Replace any light bulbs as soon as they burn out. Set up your furniture so you have a clear path. Avoid moving your furniture around. If any of your floors are uneven, fix them. If there are any pets around you, be aware of where they are. Review your medicines with your doctor. Some medicines can make you feel dizzy. This can increase your chance of falling. Ask your doctor what other things that you can do to help prevent falls. This information is not intended to replace advice given to you by your health care provider. Make sure you discuss any questions you have with your health care provider. Document Released: 02/06/2009 Document Revised: 09/18/2015 Document Reviewed: 05/17/2014 Elsevier Interactive Patient Education  2017 [employer].

## 2021-05-30 ENCOUNTER — Other Ambulatory Visit: Payer: Self-pay | Admitting: Family Medicine

## 2021-05-30 DIAGNOSIS — I10 Essential (primary) hypertension: Secondary | ICD-10-CM

## 2021-06-21 NOTE — Progress Notes (Signed)
?  ?Cardiology Office Note ? ? ?Date:  06/24/2021  ? ?ID:  Mary [name], DOB 02/01/1946, MRN [id number] ? ?PCP:  Janora Norlander, DO  ?Cardiologist:   None ?Referring:  Janora Norlander, DO ? ?Chief Complaint  ?Patient presents with  ? Loss of Consciousness  ? ? ?  ?History of Present Illness: ?Mary [name] is a 76 y.o. female who presents for evaluation of syncope.    She has a history of atrial fib.  This has been paroxysmal and is treated with flecainide.   She moved here from [place].  She said she had been on Eliquis at one point but she took her self off of it.  Aside from the beginning when she was diagnosed and treated with flecainide I do not see that she has been in atrial fibrillation since then.  She says maybe she gets occasional brief palpitations.  He does see that she had a negative stress echocardiogram in 2019.  In October 20 she had a syncopal episode.  She wore a monitor but unfortunately, she was not able to wear the pads.  They would not stick.  However, I did get 1 months worth of reading and there were no bradycardia arrhythmias and no etiology for the syncope.  She did not have an event during that time.  Since then she has had no further presyncope or syncope.  She does not feel palpitations.  She does not feel like she has been in fibrillation.  She denies any chest pressure, neck or arm discomfort.  She had no weight gain or edema. ? ? ?Past Medical History:  ?Diagnosis Date  ? Anemia   ? Anxiety   ? Arthritis   ? Cataract   ? Hypertension   ? Irregular heart beat   ? ? ?Past Surgical History:  ?Procedure Laterality Date  ? ABDOMINAL HYSTERECTOMY    ? APPENDECTOMY    ? KNEE ARTHROSCOPY    ? TONSILLECTOMY    ? ? ? ?Current Outpatient Medications  ?Medication Sig Dispense Refill  ? amLODipine (NORVASC) 5 MG tablet TAKE 1 TABLET BY MOUTH DAILY 90 tablet 1  ? aspirin EC 81 MG tablet Take 1 tablet (81 mg total) by mouth daily. Swallow whole. 90 tablet 3  ? Cholecalciferol  (VITAMIN D3) 125 MCG (5000 UT) CAPS Take 5,000 Units by mouth.    ? ferrous sulfate 325 (65 FE) MG tablet Take 325 mg by mouth daily with breakfast.    ? flecainide (TAMBOCOR) 100 MG tablet Take 100 mg by mouth 2 (two) times daily.     ? irbesartan (AVAPRO) 300 MG tablet TAKE 1 TABLET BY MOUTH  DAILY 90 tablet 1  ? metoprolol succinate (TOPROL-XL) 50 MG 24 hr tablet Take 50 mg by mouth daily.     ? Multiple Vitamins-Minerals (MULTIVITAMIN WITH MINERALS) tablet Take 1 tablet by mouth daily.    ? pravastatin (PRAVACHOL) 20 MG tablet TAKE 1 TABLET BY MOUTH  DAILY 90 tablet 1  ? ?No current facility-administered medications for this visit.  ? ? ?Allergies:   Fosamax [alendronate sodium], Sertraline, and Sulfa antibiotics  ? ? ?ROS:  Please see the history of present illness.   Otherwise, review of systems are positive for none.   All other systems are reviewed and negative.  ? ? ?PHYSICAL EXAM: ?VS:  BP (!) 168/80   Pulse 67   Ht 5' 3.5" (1.613 m)   Wt 113 lb (51.3 kg)   BMI 19.70 kg/m?  ,  BMI Body mass index is 19.7 kg/m?. ?GENERAL:  Well appearing ?NECK:  No jugular venous distention, waveform within normal limits, carotid upstroke brisk and symmetric, no bruits, no thyromegaly ?LUNGS:  Clear to auscultation bilaterally ?CHEST:  Unremarkable ?HEART:  PMI not displaced or sustained,S1 and S2 within normal limits, no S3, no S4, no clicks, no rubs, no murmurs ?ABD:  Flat, positive bowel sounds normal in frequency in pitch, no bruits, no rebound, no guarding, no midline pulsatile mass, no hepatomegaly, no splenomegaly ?EXT:  2 plus pulses throughout, no edema, no cyanosis no clubbing ? ? ?EKG:  EKG is  ordered today. ?The ekg ordered today demonstrates sinus rhythm, rate 67, axis within normal limits, first-degree AV block, poor anterior R wave progression, no acute ST-T wave changes. ? ? ?Recent Labs: ?09/01/2020: TSH 2.460 ?02/13/2021: ALT 13; BUN 8; Creatinine, Ser 0.55; Hemoglobin 11.7; Platelets 270; Potassium 3.8;  Sodium 131  ? ? ?Lipid Panel ?   ?Component Value Date/Time  ? CHOL 151 09/01/2020 1121  ? TRIG 26 09/01/2020 1121  ? HDL 101 09/01/2020 1121  ? CHOLHDL 1.5 09/01/2020 1121  ? Stark 42 09/01/2020 1121  ? ?  ? ?Wt Readings from Last 3 Encounters:  ?06/24/21 113 lb (51.3 kg)  ?05/27/21 110 lb (49.9 kg)  ?04/29/21 111 lb (50.3 kg)  ?  ? ? ?Other studies Reviewed: ?Additional studies/ records that were reviewed today include: Monitor strips. ?Review of the above records demonstrates:  Please see elsewhere in the note.   ? ? ?ASSESSMENT AND PLAN: ? ?PAF:   I do not see any evidence that she is having ongoing arrhythmias or that she is having a problem with the flecainide but I will put her on a treadmill to screen for any dysrhythmias that can be associated.  For now she will remain on the meds as listed.  She has not wanted to take a DOAC without recurrent atrial fibrillation documented.  ? ?HTN: Her blood pressure is not controlled.  She is going to keep a blood pressure diary.  She is very anxious and I will respond with med changes based on the results of home readings.  ? ?SYNCOPE:   I will screen for arrhythmias as above with the POET (Plain Old Exercise Treadmill).  However, I do not otherwise have an etiology.  She notes she still has 2 months where she is not supposed to be driving because of unexplained syncope.  She will know if she has any recurrent events.  At that point she might need an implanted monitor.  ? ?Current medicines are reviewed at length with the patient today.  The patient does not have concerns regarding medicines. ? ?The following changes have been made:  no change ? ?Labs/ tests ordered today include:  ? ?Orders Placed This Encounter  ?Procedures  ? EXERCISE TOLERANCE TEST (ETT)  ? EKG 12-Lead  ? ? ? ?Disposition:   FU with me after the monitor.    ? ? ?Signed, ?Minus Breeding, MD  ?06/24/2021 12:17 PM    ?[place] ? ? ? ?

## 2021-06-24 ENCOUNTER — Other Ambulatory Visit: Payer: Self-pay

## 2021-06-24 ENCOUNTER — Encounter: Payer: Self-pay | Admitting: Cardiology

## 2021-06-24 ENCOUNTER — Ambulatory Visit (INDEPENDENT_AMBULATORY_CARE_PROVIDER_SITE_OTHER): Payer: Medicare Other | Admitting: Cardiology

## 2021-06-24 ENCOUNTER — Telehealth: Payer: Self-pay | Admitting: *Deleted

## 2021-06-24 VITALS — BP 168/80 | HR 67 | Ht 63.5 in | Wt 113.0 lb

## 2021-06-24 DIAGNOSIS — R55 Syncope and collapse: Secondary | ICD-10-CM

## 2021-06-24 DIAGNOSIS — I10 Essential (primary) hypertension: Secondary | ICD-10-CM | POA: Diagnosis not present

## 2021-06-24 DIAGNOSIS — I480 Paroxysmal atrial fibrillation: Secondary | ICD-10-CM

## 2021-06-24 MED ORDER — ASPIRIN EC 81 MG PO TBEC: 81.0000 mg | DELAYED_RELEASE_TABLET | Freq: Every day | ORAL | 3 refills | Status: AC

## 2021-06-24 NOTE — Telephone Encounter (Signed)
[place], Malanie [place]ie [place], RN; P [facility]; [facility] ?Called patient and, she said the more she has thought about she doesn't think she is comfortable with doing the treadmill because of her hip bothering here. Told her I would have the nurse reach out to her. thanks   ?  ?   ?Previous Messages ?  ?----- Message -----  ?From: Shellia Cleverly, RN  ?Sent: 06/24/2021  12:53 PM EST  ?To: [facility], [facility]  ?Subject: GXT                                            ? ?Pt has been ordered to have GXT at [facility].  She is aware she will be contacted to be scheduled.  ? ?Thank you   ? ?Dr Percival Spanish is aware of the above information.  No further orders given at this time. ? ? ?

## 2021-06-24 NOTE — Patient Instructions (Signed)
Medication Instructions:  ?Please decrease your Aspirin to 81 mg a day. ?Continue all other medications as listed. ? ?*If you need a refill on your cardiac medications before your next appointment, please call your pharmacy* ? ?Testing/Procedures: ?Your physician has requested that you have an exercise tolerance test. For further information please visit [email or web address]. Please also follow instruction sheet, as given.  This will be completed at [facility].  Please check in at the Main Entrance.  You will be contacted to be scheduled. ? ?Please keep a blood pressure diary. ? ?Follow-Up: ?At [facility], you and your health needs are our priority.  As part of our continuing mission to provide you with exceptional heart care, we have created designated Provider Care Teams.  These Care Teams include your primary Cardiologist (physician) and Advanced Practice Providers (APPs -  Physician Assistants and Nurse Practitioners) who all work together to provide you with the care you need, when you need it. ? ?We recommend signing up for the patient portal called "MyChart".  Sign up information is provided on this After Visit Summary.  MyChart is used to connect with patients for Virtual Visits (Telemedicine).  Patients are able to view lab/test results, encounter notes, upcoming appointments, etc.  Non-urgent messages can be sent to your provider as well.   ?To learn more about what you can do with MyChart, go to [email or web address].   ? ?Your next appointment:   ?6 month(s) ? ?The format for your next appointment:   ?In Person ? ?Provider:   ?Minus Breeding, MD  ? ? ?Thank you for choosing [facility]!! ? ? ? ?

## 2021-06-26 ENCOUNTER — Other Ambulatory Visit: Payer: Self-pay | Admitting: Family Medicine

## 2021-06-26 DIAGNOSIS — I10 Essential (primary) hypertension: Secondary | ICD-10-CM

## 2021-06-26 DIAGNOSIS — E785 Hyperlipidemia, unspecified: Secondary | ICD-10-CM

## 2021-06-30 NOTE — Telephone Encounter (Signed)
Spoke with Mary [name] regarding testing that Dr. Percival Spanish had ordered.  She is aware that another type of stress testing will not be ordered at this time. Explained to the patient that if she changes her mind to please contact our office. We will be happy to re-schedule.  ?

## 2021-07-06 ENCOUNTER — Ambulatory Visit: Payer: Medicare Other | Admitting: Dermatology

## 2021-07-06 ENCOUNTER — Other Ambulatory Visit: Payer: Self-pay

## 2021-07-06 DIAGNOSIS — D692 Other nonthrombocytopenic purpura: Secondary | ICD-10-CM

## 2021-07-06 DIAGNOSIS — L570 Actinic keratosis: Secondary | ICD-10-CM | POA: Diagnosis not present

## 2021-07-06 DIAGNOSIS — L299 Pruritus, unspecified: Secondary | ICD-10-CM

## 2021-07-06 DIAGNOSIS — L309 Dermatitis, unspecified: Secondary | ICD-10-CM | POA: Diagnosis not present

## 2021-07-06 DIAGNOSIS — L821 Other seborrheic keratosis: Secondary | ICD-10-CM | POA: Diagnosis not present

## 2021-07-06 MED ORDER — TRIAMCINOLONE ACETONIDE 0.1 % EX CREA: 1.0000 "application " | TOPICAL_CREAM | Freq: Every day | CUTANEOUS | 2 refills | Status: DC

## 2021-07-06 NOTE — Patient Instructions (Addendum)
Otc- dermend for bruising  ? ?Otc - non cortisone cream (chest) ? ?Otc - pramoxine (Cera-ve) (arms and back) ? ?[facility] ? ?Goodrx  ?

## 2021-07-07 ENCOUNTER — Telehealth: Payer: Self-pay | Admitting: Dermatology

## 2021-07-07 NOTE — Telephone Encounter (Signed)
Patient wanted to know if where she's suppose to apply the Triamcinolone Cream. I informed patient that according to her visit yesterday she can use the cream on her arm and chest. Patient aware.  ?

## 2021-07-07 NOTE — Telephone Encounter (Signed)
Has question about triamcinolon... instructions on avs say nothing about it ?

## 2021-08-06 ENCOUNTER — Ambulatory Visit: Payer: Medicare Other | Admitting: Dermatology

## 2021-08-06 DIAGNOSIS — L259 Unspecified contact dermatitis, unspecified cause: Secondary | ICD-10-CM | POA: Diagnosis not present

## 2021-08-11 ENCOUNTER — Ambulatory Visit (INDEPENDENT_AMBULATORY_CARE_PROVIDER_SITE_OTHER): Payer: Medicare Other | Admitting: Family Medicine

## 2021-08-11 ENCOUNTER — Encounter: Payer: Self-pay | Admitting: Family Medicine

## 2021-08-11 VITALS — BP 162/73 | HR 60 | Temp 98.0°F | Ht 63.5 in | Wt 113.4 lb

## 2021-08-11 DIAGNOSIS — I480 Paroxysmal atrial fibrillation: Secondary | ICD-10-CM | POA: Diagnosis not present

## 2021-08-11 DIAGNOSIS — M25562 Pain in left knee: Secondary | ICD-10-CM | POA: Diagnosis not present

## 2021-08-11 DIAGNOSIS — D509 Iron deficiency anemia, unspecified: Secondary | ICD-10-CM

## 2021-08-11 DIAGNOSIS — I10 Essential (primary) hypertension: Secondary | ICD-10-CM | POA: Diagnosis not present

## 2021-08-11 DIAGNOSIS — G8929 Other chronic pain: Secondary | ICD-10-CM

## 2021-08-11 NOTE — Patient Instructions (Signed)
You had labs performed today.  You will be contacted with the results of the labs once they are available, usually in the next 3 business days for routine lab work.  If you have an active my chart account, they will be released to your MyChart.  If you prefer to have these labs released to you via telephone, please let us know.     

## 2021-08-11 NOTE — Progress Notes (Signed)
? ?Subjective: ?CC: Recheck anemia ?PCP: Janora Norlander, DO ?TOI:Mary [name] is a 76 y.o. female presenting to clinic today for: ? ?1.  Anemia ?Patient reports compliance with her ferrous sulfate daily.  She does not take this with a vitamin C at the same time but does take a vitamin C tablet daily.  She does not report any dizziness, change in exercise tolerance, fatigue. ? ?2.  Hypertension with hyperlipidemia ?Patient is compliant with Norvasc 5 mg daily, flecainide, metoprolol and Pravachol.  She is also on a baby aspirin 81 mg.  This was a change from her previous 325 daily.  She is under the care of Dr. Percival Spanish and plans to have some type of visit in the fall.  No reports of chest pain, shortness of breath, heart palpitations.  Not currently monitoring blood pressures at home. ? ?3.  Knee pain/swelling ?Patient reports chronic knee pain and swelling on the left side.  She has some hip pain as well and these limit her ability to do an exercise stress test.  She is still not sure if this stress test will be converted to a chemical stress test or not.  She is hesitant to use a brace on the left knee because she does not want to exacerbate the bursitis in her left ankle. ? ?ROS: Per HPI ? ?Allergies  ?Allergen Reactions  ? Fosamax [Alendronate Sodium]   ?  Dizziness, elevated blood pressure  ? Sertraline Nausea Only  ?  Insomnia, did not feel well  ? Sulfa Antibiotics Nausea Only  ? ?Past Medical History:  ?Diagnosis Date  ? Anemia   ? Anxiety   ? Arthritis   ? Cataract   ? Hypertension   ? Irregular heart beat   ? ? ?Current Outpatient Medications:  ?  amLODipine (NORVASC) 5 MG tablet, TAKE 1 TABLET BY MOUTH DAILY, Disp: 90 tablet, Rfl: 1 ?  aspirin EC 81 MG tablet, Take 1 tablet (81 mg total) by mouth daily. Swallow whole., Disp: 90 tablet, Rfl: 3 ?  Cholecalciferol (VITAMIN D3) 125 MCG (5000 UT) CAPS, Take 5,000 Units by mouth., Disp: , Rfl:  ?  ferrous sulfate 325 (65 FE) MG tablet, Take 325 mg by  mouth daily with breakfast., Disp: , Rfl:  ?  flecainide (TAMBOCOR) 100 MG tablet, Take 100 mg by mouth 2 (two) times daily. , Disp: , Rfl:  ?  irbesartan (AVAPRO) 300 MG tablet, TAKE 1 TABLET BY MOUTH  DAILY, Disp: 90 tablet, Rfl: 0 ?  metoprolol succinate (TOPROL-XL) 50 MG 24 hr tablet, Take 50 mg by mouth daily. , Disp: , Rfl:  ?  Multiple Vitamins-Minerals (MULTIVITAMIN WITH MINERALS) tablet, Take 1 tablet by mouth daily., Disp: , Rfl:  ?  pravastatin (PRAVACHOL) 20 MG tablet, TAKE 1 TABLET BY MOUTH  DAILY, Disp: 90 tablet, Rfl: 0 ?Social History  ? ?Socioeconomic History  ? Marital status: Widowed  ?  Spouse name: Not on file  ? Number of children: 3  ? Years of education: 57  ? Highest education level: 12th grade  ?Occupational History  ? Occupation: retired  ?Tobacco Use  ? Smoking status: Never  ? Smokeless tobacco: Never  ?Vaping Use  ? Vaping Use: Never used  ?Substance and Sexual Activity  ? Alcohol use: Yes  ?  Alcohol/week: 7.0 standard drinks  ?  Types: 7 Glasses of wine per week  ? Drug use: No  ? Sexual activity: Not Currently  ?Other Topics Concern  ? Not on  file  ?Social History Narrative  ? Lives alone. Two sons and a daughter.  Widow.    ? ?Social Determinants of Health  ? ?Financial Resource Strain: Low Risk   ? Difficulty of Paying Living Expenses: Not hard at all  ?Food Insecurity: No Food Insecurity  ? Worried About Charity fundraiser in the Last Year: Never true  ? Ran Out of Food in the Last Year: Never true  ?Transportation Needs: No Transportation Needs  ? Lack of Transportation (Medical): No  ? Lack of Transportation (Non-Medical): No  ?Physical Activity: Sufficiently Active  ? Days of Exercise per Week: 7 days  ? Minutes of Exercise per Session: 30 min  ?Stress: No Stress Concern Present  ? Feeling of Stress : Not at all  ?Social Connections: Moderately Integrated  ? Frequency of Communication with Friends and Family: More than three times a week  ? Frequency of Social Gatherings with  Friends and Family: Once a week  ? Attends Religious Services: More than 4 times per year  ? Active Member of Clubs or Organizations: Yes  ? Attends Archivist Meetings: More than 4 times per year  ? Marital Status: Widowed  ?Intimate Partner Violence: Not At Risk  ? Fear of Current or Ex-Partner: No  ? Emotionally Abused: No  ? Physically Abused: No  ? Sexually Abused: No  ? ?Family History  ?Problem Relation Age of Onset  ? Arthritis Mother   ? CAD Mother 34  ? Hypertension Mother   ? Hyperlipidemia Mother   ? Kidney disease Mother   ? Heart disease Mother   ? Hypertension Brother   ? Stroke Maternal Grandmother   ? Heart disease Maternal Grandfather   ? Arthritis Paternal Grandmother   ? Breast cancer Neg Hx   ? ? ?Objective: ?Office vital signs reviewed. ?BP (!) 162/73   Pulse 60   Temp 98 ?F (36.7 ?C)   Ht 5' 3.5" (1.613 m)   Wt 113 lb 6.4 oz (51.4 kg)   SpO2 99%   BMI 19.77 kg/m?  ? ?Physical Examination:  ?General: Awake, alert, well-appearing, petite female, No acute distress ?HEENT: Sclera white.  Moist mucous membranes ?Cardio: regular rate and rhythm, S1S2 heard, no murmurs appreciated ?Pulm: clear to auscultation bilaterally, no wheezes, rhonchi or rales; normal work of breathing on room air ?MSK: Normal gait and station ? Left knee: Some mild osteoarthritic changes.  She does not demonstrate gross joint effusion, warmth or heat. ? ?Assessment/ Plan: ?76 y.o. female  ? ?Paroxysmal A-fib ([place]) ? ?Essential hypertension with goal blood pressure less than 140/90 ? ?Iron deficiency anemia, unspecified iron deficiency anemia type ? ?Chronic pain of left knee ? ?A-fib is both rate and rhythm controlled on exam today.   ? ?Blood pressure is not at goal today.  I would like her to monitor blood pressures for the next 2 weeks and if persistently above 150/90, would strongly suggest advancing medications. ? ?Check CBC.  She has a standing order for this. ? ?Offered corticosteroid injection in that  left knee if needed.  She will contact me.  In the meantime, would consider a soft brace ? ?No orders of the defined types were placed in this encounter. ? ?No orders of the defined types were placed in this encounter. ? ? ? ?Janora Norlander, DO ?[place] ?[phone] ? ?

## 2021-08-24 ENCOUNTER — Other Ambulatory Visit: Payer: Self-pay | Admitting: Family Medicine

## 2021-08-24 ENCOUNTER — Encounter: Payer: Self-pay | Admitting: Dermatology

## 2021-08-24 DIAGNOSIS — I10 Essential (primary) hypertension: Secondary | ICD-10-CM

## 2021-08-24 NOTE — Progress Notes (Signed)
? ?  Follow-Up Visit ?  ?Subjective  ?Mary [name] is a 76 y.o. female who presents for the following: Follow-up (Here for 1 month follow up on chest. Had some rash like irritation. We gave her triamcinolone cream. She said it is a lot better but still flaky and dry. ). ? ?Rash and peeling on chest ?Location:  ?Duration:  ?Quality:  ?Associated Signs/Symptoms: ?Modifying Factors:  ?Severity:  ?Timing: ?Context:  ? ?Objective  ?Well appearing patient in no apparent distress; mood and affect are within normal limits. ?Chest (Upper Torso, Anterior) ?Although there is perhaps 75% clearing and active inflammation, clinically this remains an integrative diffuse UV damage plus inflammation, perhaps contact allergic dermatitis.  We discussed scheduling patch testing but have deferred. ? ? ? ?A focused examination was performed including head, neck, chest. Relevant physical exam findings are noted in the Assessment and Plan. ? ? ?Assessment & Plan  ? ? ?Contact dermatitis, unspecified contact dermatitis type, unspecified trigger ?Chest (Upper Torso, Anterior) ? ?She will continue with the use of her triamcinolone and we will decide in late fall or winter weather to target the UV damage. ? ? ? ? ? ?I, Lavonna Monarch, MD, have reviewed all documentation for this visit.  The documentation on 08/24/21 for the exam, diagnosis, procedures, and orders are all accurate and complete. ?

## 2021-09-19 ENCOUNTER — Other Ambulatory Visit: Payer: Self-pay | Admitting: Family Medicine

## 2021-09-19 DIAGNOSIS — E785 Hyperlipidemia, unspecified: Secondary | ICD-10-CM

## 2021-09-19 DIAGNOSIS — I10 Essential (primary) hypertension: Secondary | ICD-10-CM

## 2021-09-29 DIAGNOSIS — H2513 Age-related nuclear cataract, bilateral: Secondary | ICD-10-CM | POA: Diagnosis not present

## 2021-11-06 ENCOUNTER — Ambulatory Visit (INDEPENDENT_AMBULATORY_CARE_PROVIDER_SITE_OTHER): Payer: Medicare Other | Admitting: Family Medicine

## 2021-11-06 DIAGNOSIS — H5711 Ocular pain, right eye: Secondary | ICD-10-CM | POA: Diagnosis not present

## 2021-11-06 NOTE — Progress Notes (Signed)
Telephone visit  Subjective: CC:eye pain PCP: Janora Norlander, DO Mary [name] is a 76 y.o. female calls for telephone consult today. Patient provides verbal consent for consult held via phone.  Due to COVID-19 pandemic this visit was conducted virtually. This visit type was conducted due to national recommendations for restrictions regarding the COVID-19 Pandemic (e.g. social distancing, sheltering in place) in an effort to limit this patient's exposure and mitigate transmission in our community. All issues noted in this document were discussed and addressed.  A physical exam was not performed with this format.   Location of patient: home Location of provider: [facility] Others present for call: none  1. Eye pain Patient reports last eye exam was about 5 weeks.  She denies getting a foreign body in it.  She reports right eye is having sharp pains in the upper eye above the right eyelid.  She reports associated mild headache.  Reading makes it worse. No double vision. No photophobia or excessive tearing reported.  She reports mild discharge in the corner of the right eye when she wakes up in the am.  She is noting some redness in her eye now in the corner but it doesn't look like "pink eye"   ROS: Per HPI  Allergies  Allergen Reactions   Fosamax [Alendronate Sodium]     Dizziness, elevated blood pressure   Sertraline Nausea Only    Insomnia, did not feel well   Sulfa Antibiotics Nausea Only   Past Medical History:  Diagnosis Date   Anemia    Anxiety    Arthritis    Cataract    Hypertension    Irregular heart beat     Current Outpatient Medications:    amLODipine (NORVASC) 5 MG tablet, TAKE 1 TABLET BY MOUTH DAILY, Disp: 100 tablet, Rfl: 1   aspirin EC 81 MG tablet, Take 1 tablet (81 mg total) by mouth daily. Swallow whole., Disp: 90 tablet, Rfl: 3   Cholecalciferol (VITAMIN D3) 125 MCG (5000 UT) CAPS, Take 5,000 Units by mouth., Disp: , Rfl:    ferrous sulfate 325 (65  FE) MG tablet, Take 325 mg by mouth daily with breakfast., Disp: , Rfl:    flecainide (TAMBOCOR) 100 MG tablet, Take 100 mg by mouth 2 (two) times daily. , Disp: , Rfl:    irbesartan (AVAPRO) 300 MG tablet, TAKE 1 TABLET BY MOUTH DAILY, Disp: 90 tablet, Rfl: 0   metoprolol succinate (TOPROL-XL) 50 MG 24 hr tablet, Take 50 mg by mouth daily. , Disp: , Rfl:    Multiple Vitamins-Minerals (MULTIVITAMIN WITH MINERALS) tablet, Take 1 tablet by mouth daily., Disp: , Rfl:    pravastatin (PRAVACHOL) 20 MG tablet, TAKE 1 TABLET BY MOUTH DAILY, Disp: 90 tablet, Rfl: 0  Assessment/ Plan: 76 y.o. female   Discomfort of right eye  Suspect that this is likely some type of abrasion but because this is a limited exam due to telephonic visit I am going to coordinate her care with our local optometrist, Dr. Marin Comment who can assess her tomorrow morning.  I doubt bacterial conjunctivitis or preseptal cellulitis based on her report today.  Given lack of photophobia also unlikely to have some type of ulceration or corneal abrasion.  Verbal permission provided to coordinate care with Dr Marin Comment, so I will also send her a copy of our office visit today.  I discussed with the patient red flag signs and symptoms which would warrant further evaluation in the ER and she voiced good understanding.  Start time: 4:37pm End time: 4:44pm  Total time spent on patient care (including telephone call/ virtual visit): 7 minutes  [place], [place] [phone]

## 2021-11-07 DIAGNOSIS — H10013 Acute follicular conjunctivitis, bilateral: Secondary | ICD-10-CM | POA: Diagnosis not present

## 2021-12-09 ENCOUNTER — Other Ambulatory Visit: Payer: Self-pay | Admitting: Family Medicine

## 2021-12-09 DIAGNOSIS — I10 Essential (primary) hypertension: Secondary | ICD-10-CM

## 2021-12-09 DIAGNOSIS — E785 Hyperlipidemia, unspecified: Secondary | ICD-10-CM

## 2021-12-13 ENCOUNTER — Encounter: Payer: Self-pay | Admitting: Dermatology

## 2021-12-13 NOTE — Progress Notes (Signed)
   Follow-Up Visit   Subjective  Mary [name] is a 76 y.o. female who presents for the following: Skin Problem (Pt here for some spots on the chest L arm x66moand the R hand. Pt states that the spot intensely itch. Pt uses otc cortisone cream but doesn't work ).  Several crusts and itchy areas of concern Location:  Duration:  Quality:  Associated Signs/Symptoms: Modifying Factors:  Severity:  Timing: Context:   Objective  Well appearing patient in no apparent distress; mood and affect are within normal limits. Right Dorsal Hand (2) Hornlike 3 mm pink crust  Left Temporal Scalp, Mid Back Noninflamed light brown 4 to 6 mm brown textured papules, compatible dermoscopy  Left Arm, Right Arm Itching without visible inflamed lesions or rash compatible with brachioradial pruritus.  Chest - Medial (Center) Mild pink scale inflammation chest compatible with seborrheic dermatitis  Left Forearm - Posterior, Right Forearm - Posterior Several 1 cm ecchymoses forearms, no history of abnormal bleeding    All skin waist up examined.  Areas beneath undergarments not fully examined.   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  Destruction of lesion - Right Dorsal Hand Complexity: simple   Destruction method: cryotherapy   Informed consent: discussed and consent obtained   Timeout:  patient name, date of birth, surgical site, and procedure verified Lesion destroyed using liquid nitrogen: Yes   Cryotherapy cycles:  3 Outcome: patient tolerated procedure well with no complications   Post-procedure details: wound care instructions given    Seborrheic keratosis (2) Mid Back; Left Temporal Scalp  Leave if stable  Itching (2) Left Arm; Right Arm  Can initially try an over-the-counter itch lotion containing the ingredient pramoxine.  This can be used as frequently as she wishes if there is relief of her symptoms.  triamcinolone cream (KENALOG) 0.1 % -  Left Arm, Right Arm Apply 1 application. topically daily.  Dermatitis Chest - Medial (Center)  No intervention initiated  Related Medications triamcinolone cream (KENALOG) 0.1 % Apply 1 application. topically daily.  Solar purpura (HZurich (2) Left Forearm - Posterior; Right Forearm - Posterior  May look for over-the-counter Dermend bruise cream.      I, SLavonna Monarch MD, have reviewed all documentation for this visit.  The documentation on 12/13/21 for the exam, diagnosis, procedures, and orders are all accurate and complete.

## 2021-12-22 NOTE — Progress Notes (Unsigned)
Cardiology Office Note   Date:  12/23/2021   ID:  Mary [name] 1945/07/30, MRN [id number]  PCP:  Janora Norlander, DO  Cardiologist:   None Referring:  Janora Norlander, DO  Chief Complaint  Patient presents with   Atrial Fibrillation      History of Present Illness: Mary [name] is a 76 y.o. female who presents for evaluation of syncope.    She has a history of atrial fib.  This has been paroxysmal and is treated with flecainide.   She had a monitor and had no arrhythmias.  I was going to order a POET (Plain Old Exercise Treadmill).    She actually did not think she one of the treadmill or or could necessarily do that some was canceled.  We are doing this in part to evaluate syncope and some palpitations.  She has not had any further symptoms.  She rarely gets palpitations and has a watch and has not had any recorded prolonged episodes of fibrillation. The patient denies any new symptoms such as chest discomfort, neck or arm discomfort. There has been no new shortness of breath, PND or orthopnea. There have been no reported palpitations, presyncope or syncope.    Past Medical History:  Diagnosis Date   Anemia    Anxiety    Arthritis    Cataract    Hypertension    Irregular heart beat     Past Surgical History:  Procedure Laterality Date   ABDOMINAL HYSTERECTOMY     APPENDECTOMY     KNEE ARTHROSCOPY     TONSILLECTOMY       Current Outpatient Medications  Medication Sig Dispense Refill   amLODipine (NORVASC) 5 MG tablet TAKE 1 TABLET BY MOUTH DAILY 100 tablet 1   aspirin EC 81 MG tablet Take 1 tablet (81 mg total) by mouth daily. Swallow whole. 90 tablet 3   Cholecalciferol (VITAMIN D3) 125 MCG (5000 UT) CAPS Take 5,000 Units by mouth.     ferrous sulfate 325 (65 FE) MG tablet Take 325 mg by mouth daily with breakfast.     flecainide (TAMBOCOR) 100 MG tablet Take 100 mg by mouth 2 (two) times daily.      irbesartan (AVAPRO) 300 MG tablet TAKE 1  TABLET BY MOUTH DAILY 100 tablet 0   metoprolol succinate (TOPROL-XL) 50 MG 24 hr tablet Take 50 mg by mouth daily.      Multiple Vitamins-Minerals (MULTIVITAMIN WITH MINERALS) tablet Take 1 tablet by mouth daily.     pravastatin (PRAVACHOL) 20 MG tablet TAKE 1 TABLET BY MOUTH DAILY 100 tablet 0   No current facility-administered medications for this visit.    Allergies:   Fosamax [alendronate sodium], Sertraline, and Sulfa antibiotics    ROS:  Please see the history of present illness.   Otherwise, review of systems are positive for none.   All other systems are reviewed and negative.    PHYSICAL EXAM: VS:  BP (!) 178/80   Pulse 64   Ht 5' 3.5" (1.613 m)   Wt 105 lb (47.6 kg)   BMI 18.31 kg/m  , BMI Body mass index is 18.31 kg/m. GENERAL:  Well appearing NECK:  No jugular venous distention, waveform within normal limits, carotid upstroke brisk and symmetric, no bruits, no thyromegaly LUNGS:  Clear to auscultation bilaterally CHEST:  Unremarkable HEART:  PMI not displaced or sustained,S1 and S2 within normal limits, no S3, no S4, no clicks, no rubs, no murmurs ABD:  Flat, positive bowel sounds normal in frequency in pitch, no bruits, no rebound, no guarding, no midline pulsatile mass, no hepatomegaly, no splenomegaly EXT:  2 plus pulses throughout, no edema, no cyanosis no clubbing   EKG:  EKG is not ordered today. The ekg ordered today demonstrates sinus rhythm, rate 67, axis within normal limits, first-degree AV block, poor anterior R wave progression, no acute ST-T wave changes.  06/25/2021   Recent Labs: 02/13/2021: ALT 13; BUN 8; Creatinine, Ser 0.55; Hemoglobin 11.7; Platelets 270; Potassium 3.8; Sodium 131    Lipid Panel    Component Value Date/Time   CHOL 151 09/01/2020 1121   TRIG 26 09/01/2020 1121   HDL 101 09/01/2020 1121   CHOLHDL 1.5 09/01/2020 1121   LDLCALC 42 09/01/2020 1121      Wt Readings from Last 3 Encounters:  12/23/21 105 lb (47.6 kg)   08/11/21 113 lb 6.4 oz (51.4 kg)  06/24/21 113 lb (51.3 kg)      Other studies Reviewed: Additional studies/ records that were reviewed today include: Labs Review of the above records demonstrates:  Please see elsewhere in the note.     ASSESSMENT AND PLAN:  PAF:    She has a very low burden of atrial fibrillation.  She is well controlled on flecainide.  No change in therapy.  She is up-to-date with follow-up labs and will be having some repeated in October.  HTN:    Her blood pressure is elevated but I did review a blood pressure diary last year and her blood pressure is well controlled at home and she says they are now.  No change in therapy. .   SYNCOPE:   She has had no further syncopal episode.  No further work-up.  Current medicines are reviewed at length with the patient today.  The patient does not have concerns regarding medicines.  The following changes have been made:  no change  Labs/ tests ordered today include:   No orders of the defined types were placed in this encounter.    Disposition:   FU with me after the monitor.      Signed, Minus Breeding, MD  12/23/2021 12:17 PM    [facility]

## 2021-12-23 ENCOUNTER — Ambulatory Visit: Payer: Medicare Other | Admitting: Cardiology

## 2021-12-23 ENCOUNTER — Encounter: Payer: Self-pay | Admitting: Cardiology

## 2021-12-23 VITALS — BP 178/80 | HR 64 | Ht 63.5 in | Wt 105.0 lb

## 2021-12-23 DIAGNOSIS — I10 Essential (primary) hypertension: Secondary | ICD-10-CM | POA: Diagnosis not present

## 2021-12-23 DIAGNOSIS — R55 Syncope and collapse: Secondary | ICD-10-CM

## 2021-12-23 DIAGNOSIS — I480 Paroxysmal atrial fibrillation: Secondary | ICD-10-CM

## 2021-12-23 NOTE — Patient Instructions (Signed)
Medication Instructions:  The current medical regimen is effective;  continue present plan and medications.  *If you need a refill on your cardiac medications before your next appointment, please call your pharmacy*  Follow-Up: At [facility], you and your health needs are our priority.  As part of our continuing mission to provide you with exceptional heart care, we have created designated Provider Care Teams.  These Care Teams include your primary Cardiologist (physician) and Advanced Practice Providers (APPs -  Physician Assistants and Nurse Practitioners) who all work together to provide you with the care you need, when you need it.  We recommend signing up for the patient portal called "MyChart".  Sign up information is provided on this After Visit Summary.  MyChart is used to connect with patients for Virtual Visits (Telemedicine).  Patients are able to view lab/test results, encounter notes, upcoming appointments, etc.  Non-urgent messages can be sent to your provider as well.   To learn more about what you can do with MyChart, go to [email or web address].    Your next appointment:   1 year(s)  The format for your next appointment:   In Person  Provider:   James Hochrein, MD     Important Information About Sugar       

## 2022-01-18 ENCOUNTER — Telehealth: Payer: Self-pay | Admitting: Cardiology

## 2022-01-18 DIAGNOSIS — I10 Essential (primary) hypertension: Secondary | ICD-10-CM

## 2022-01-18 MED ORDER — AMLODIPINE BESYLATE 5 MG PO TABS: 5.0000 mg | ORAL_TABLET | Freq: Every day | ORAL | 3 refills | Status: DC

## 2022-01-18 MED ORDER — METOPROLOL SUCCINATE ER 50 MG PO TB24: 50.0000 mg | ORAL_TABLET | Freq: Every day | ORAL | 3 refills | Status: DC

## 2022-01-18 MED ORDER — FLECAINIDE ACETATE 100 MG PO TABS: 100.0000 mg | ORAL_TABLET | Freq: Two times a day (BID) | ORAL | 3 refills | Status: DC

## 2022-01-18 MED ORDER — IRBESARTAN 300 MG PO TABS: 300.0000 mg | ORAL_TABLET | Freq: Every day | ORAL | 3 refills | Status: DC

## 2022-01-18 NOTE — Telephone Encounter (Signed)
*  STAT* If patient is at the pharmacy, call can be transferred to refill team.   1. Which medications need to be refilled? (please list name of each medication and dose if known)   flecainide (TAMBOCOR) 100 MG tablet metoprolol succinate (TOPROL-XL) 50 MG 24 hr tablet amLODipine (NORVASC) 5 MG tablet irbesartan (AVAPRO) 300 MG tablet  2. Which pharmacy/location (including street and city if local pharmacy) is medication to be sent to?    [facility] ([facility]) - [place], [place]  3. Do they need a 30 day or 90 day supply? 90 day  Patient stated she still has some of this medication left.

## 2022-02-10 ENCOUNTER — Ambulatory Visit (INDEPENDENT_AMBULATORY_CARE_PROVIDER_SITE_OTHER): Payer: Medicare Other | Admitting: Family Medicine

## 2022-02-10 ENCOUNTER — Encounter: Payer: Self-pay | Admitting: Family Medicine

## 2022-02-10 VITALS — BP 169/70 | HR 58 | Temp 97.8°F | Ht 63.5 in | Wt 107.2 lb

## 2022-02-10 DIAGNOSIS — Z23 Encounter for immunization: Secondary | ICD-10-CM | POA: Diagnosis not present

## 2022-02-10 DIAGNOSIS — Z0001 Encounter for general adult medical examination with abnormal findings: Secondary | ICD-10-CM

## 2022-02-10 DIAGNOSIS — E782 Mixed hyperlipidemia: Secondary | ICD-10-CM | POA: Diagnosis not present

## 2022-02-10 DIAGNOSIS — I10 Essential (primary) hypertension: Secondary | ICD-10-CM | POA: Diagnosis not present

## 2022-02-10 DIAGNOSIS — M810 Age-related osteoporosis without current pathological fracture: Secondary | ICD-10-CM | POA: Diagnosis not present

## 2022-02-10 DIAGNOSIS — H9193 Unspecified hearing loss, bilateral: Secondary | ICD-10-CM

## 2022-02-10 DIAGNOSIS — I480 Paroxysmal atrial fibrillation: Secondary | ICD-10-CM

## 2022-02-10 DIAGNOSIS — F419 Anxiety disorder, unspecified: Secondary | ICD-10-CM

## 2022-02-10 DIAGNOSIS — Z0000 Encounter for general adult medical examination without abnormal findings: Secondary | ICD-10-CM

## 2022-02-10 DIAGNOSIS — R4589 Other symptoms and signs involving emotional state: Secondary | ICD-10-CM

## 2022-02-10 DIAGNOSIS — Z862 Personal history of diseases of the blood and blood-forming organs and certain disorders involving the immune mechanism: Secondary | ICD-10-CM | POA: Diagnosis not present

## 2022-02-10 MED ORDER — GABAPENTIN 100 MG PO CAPS: 100.0000 mg | ORAL_CAPSULE | Freq: Two times a day (BID) | ORAL | 3 refills | Status: AC | PRN

## 2022-02-10 MED ORDER — PRAVASTATIN SODIUM 20 MG PO TABS: 20.0000 mg | ORAL_TABLET | Freq: Every day | ORAL | 3 refills | Status: DC

## 2022-02-10 NOTE — Progress Notes (Signed)
Mary [name] is a 76 y.o. female presents to office today for annual physical exam examination.    Concerns today include: 1. Anxiety/ depression She reports ongoing feelings of nervousness sometimes this causes her to shake.  She of course is lonely because she is a widow.  She has 1 son that resides locally and she does see him frequently.  She will be seeing her other son, her eldest, in [place] for her birthday next week and she is really looking forward to this.  She still does not engage really with anyone outside of that.  She has a cat at home named Chloe.  She does a lot of reading which she enjoys and walks up to 2 miles per day  Occupation: retired, Marital status: widowed, Substance use: none Diet: balanced, Exercise: regular Last eye exam: UTD Last dental exam: UTD Last colonoscopy: done Last mammogram: UTD Last pap smear: n/a Refills needed today: all Immunizations needed: flu, shingles, covid Immunization History  Administered Date(s) Administered   Fluad Quad(high Dose 65+) 02/05/2020, 04/10/2021   Influenza Split 06/13/2013   Influenza, High Dose Seasonal PF 06/06/2012   Influenza, Seasonal, Injecte, Preservative Fre 03/31/2015   Influenza,inj,Quad PF,6+ Mos 03/31/2015   Influenza,trivalent, recombinat, inj, PF 06/13/2013   PFIZER(Purple Top)SARS-COV-2 Vaccination 11/30/2019, 12/21/2019   Pneumococcal Conjugate-13 06/13/2013   Pneumococcal Polysaccharide-23 04/17/2010   Pneumococcal-Unspecified 04/17/2010   Td 04/27/2003   Td (Adult), 2 Lf Tetanus Toxid, Preservative Free 04/27/2003   Tdap 06/13/2013     Past Medical History:  Diagnosis Date   Age-related osteoporosis without current pathological fracture    Anemia    Anxiety    Arthritis    Cataract    Hypertension    Hyponatremia 08/20/2014   Related to HCTZ, advised by prior MD to stop the HCTZ, she is convinced that she needs it to control her BP Sodium as low as 127 on 06/21/14   Irregular  heart beat    Social History   Socioeconomic History   Marital status: Widowed    Spouse name: Not on file   Number of children: 3   Years of education: 52   Highest education level: 12th grade  Occupational History   Occupation: retired  Tobacco Use   Smoking status: Never   Smokeless tobacco: Never  Vaping Use   Vaping Use: Never used  Substance and Sexual Activity   Alcohol use: Yes    Alcohol/week: 7.0 standard drinks of alcohol    Types: 7 Glasses of wine per week   Drug use: No   Sexual activity: Not Currently  Other Topics Concern   Not on file  Social History Narrative   Lives alone. Two sons and a daughter.  Widow.     Social Determinants of Health   Financial Resource Strain: Low Risk  (05/27/2021)   Overall Financial Resource Strain (CARDIA)    Difficulty of Paying Living Expenses: Not hard at all  Food Insecurity: No Food Insecurity (05/27/2021)   Hunger Vital Sign    Worried About Running Out of Food in the Last Year: Never true    Ran Out of Food in the Last Year: Never true  Transportation Needs: No Transportation Needs (05/27/2021)   PRAPARE - Hydrologist (Medical): No    Lack of Transportation (Non-Medical): No  Physical Activity: Sufficiently Active (05/27/2021)   Exercise Vital Sign    Days of Exercise per Week: 7 days    Minutes of  Exercise per Session: 30 min  Stress: No Stress Concern Present (05/27/2021)   [place]    Feeling of Stress : Not at all  Social Connections: Moderately Integrated (05/27/2021)   Social Connection and Isolation Panel [NHANES]    Frequency of Communication with Friends and Family: More than three times a week    Frequency of Social Gatherings with Friends and Family: Once a week    Attends Religious Services: More than 4 times per year    Active Member of [employer] or Organizations: Yes    Attends Archivist Meetings: More  than 4 times per year    Marital Status: Widowed  Intimate Partner Violence: Not At Risk (05/27/2021)   Humiliation, Afraid, Rape, and Kick questionnaire    Fear of Current or Ex-Partner: No    Emotionally Abused: No    Physically Abused: No    Sexually Abused: No   Past Surgical History:  Procedure Laterality Date   ABDOMINAL HYSTERECTOMY     APPENDECTOMY     KNEE ARTHROSCOPY     TONSILLECTOMY     Family History  Problem Relation Age of Onset   Arthritis Mother    CAD Mother 74   Hypertension Mother    Hyperlipidemia Mother    Kidney disease Mother    Heart disease Mother    Hypertension Brother    Stroke Maternal Grandmother    Heart disease Maternal Grandfather    Arthritis Paternal Grandmother    Breast cancer Neg Hx     Current Outpatient Medications:    amLODipine (NORVASC) 5 MG tablet, Take 1 tablet (5 mg total) by mouth daily., Disp: 90 tablet, Rfl: 3   aspirin EC 81 MG tablet, Take 1 tablet (81 mg total) by mouth daily. Swallow whole., Disp: 90 tablet, Rfl: 3   Cholecalciferol (VITAMIN D3) 125 MCG (5000 UT) CAPS, Take 5,000 Units by mouth., Disp: , Rfl:    ferrous sulfate 325 (65 FE) MG tablet, Take 325 mg by mouth daily with breakfast., Disp: , Rfl:    flecainide (TAMBOCOR) 100 MG tablet, Take 1 tablet (100 mg total) by mouth 2 (two) times daily., Disp: 180 tablet, Rfl: 3   irbesartan (AVAPRO) 300 MG tablet, Take 1 tablet (300 mg total) by mouth daily., Disp: 90 tablet, Rfl: 3   metoprolol succinate (TOPROL-XL) 50 MG 24 hr tablet, Take 1 tablet (50 mg total) by mouth daily., Disp: 90 tablet, Rfl: 3   Multiple Vitamins-Minerals (MULTIVITAMIN WITH MINERALS) tablet, Take 1 tablet by mouth daily., Disp: , Rfl:    pravastatin (PRAVACHOL) 20 MG tablet, TAKE 1 TABLET BY MOUTH DAILY, Disp: 100 tablet, Rfl: 0  Allergies  Allergen Reactions   Fosamax [Alendronate Sodium]     Dizziness, elevated blood pressure   Sertraline Nausea Only    Insomnia, did not feel well    Sulfa Antibiotics Nausea Only     ROS: Review of Systems Pertinent items noted in HPI and remainder of comprehensive ROS otherwise negative.    Physical exam BP (!) 169/70   Pulse (!) 58   Temp 97.8 F (36.6 C)   Ht 5' 3.5" (1.613 m)   Wt 107 lb 3.2 oz (48.6 kg)   SpO2 99%   BMI 18.69 kg/m  General appearance: alert, cooperative, and appears stated age Head: Normocephalic, without obvious abnormality, atraumatic Eyes: negative findings: lids and lashes normal, conjunctivae and sclerae normal, corneas clear, and pupils equal, round, reactive to  light and accomodation Ears: normal TM's and external ear canals both ears Nose: Nares normal. Septum midline. Mucosa normal. No drainage or sinus tenderness. Throat: lips, mucosa, and tongue normal; teeth and gums normal Neck: no adenopathy, no carotid bruit, supple, symmetrical, trachea midline, and thyroid not enlarged, symmetric, no tenderness/mass/nodules Back: symmetric, no curvature. ROM normal. No CVA tenderness. Lungs: clear to auscultation bilaterally Heart: regular rate and rhythm, S1, S2 normal, no murmur, click, rub or gallop Abdomen: soft, non-tender; bowel sounds normal; no masses,  no organomegaly Extremities: extremities normal, atraumatic, no cyanosis or edema Pulses: 2+ and symmetric Skin: Skin color, texture, turgor normal. No rashes or lesions Lymph nodes: Cervical, supraclavicular, and axillary nodes normal. Neurologic: Alert and oriented X 3, normal strength and tone. Normal symmetric reflexes. Normal coordination and gait    08/11/2021    9:51 AM 05/27/2021    2:47 PM 04/10/2021    1:34 PM  Depression screen PHQ 2/9  Decreased Interest 0 0 0  Down, Depressed, Hopeless 0 0 0  PHQ - 2 Score 0 0 0      08/11/2021    9:52 AM 04/10/2021    1:34 PM 01/07/2021   10:13 AM 10/06/2020   11:01 AM  GAD 7 : Generalized Anxiety Score  Nervous, Anxious, on Edge 0 0 0 0  Control/stop worrying 0 0 0 0  Worry too much -  different things 0 0 0 0  Trouble relaxing 0 0 0 0  Restless 0 0 0 0  Easily annoyed or irritable 0 0 0 0  Afraid - awful might happen 0 0 0 0  Total GAD 7 Score 0 0 0 0  Anxiety Difficulty Not difficult at all Not difficult at all Not difficult at all         Assessment/ Plan: Mary [name] here for annual physical exam.   Annual physical exam  Need for immunization against influenza - Plan: Flu Vaccine QUAD High Dose(Fluad)  Anxiety - Plan: gabapentin (NEURONTIN) 100 MG capsule  Loneliness  Hearing difficulty of both ears - Plan: Ambulatory referral to Audiology  Essential hypertension with goal blood pressure less than 140/90 - Plan: CMP14+EGFR, Lipid Panel  Mixed hyperlipidemia - Plan: Lipid Panel, pravastatin (PRAVACHOL) 20 MG tablet  Paroxysmal atrial fibrillation (HCC) - Plan: CMP14+EGFR, TSH  Age-related osteoporosis without current pathological fracture - Plan: CMP14+EGFR, VITAMIN D 25 Hydroxy (Vit-D Deficiency, Fractures)  History of anemia - Plan: Iron, CBC  Influenza vaccination administered.  We will trial her on gabapentin 111m at bedtime.  There is a postmarketing risk of hyponatremia but she has not had issues with this when in the past.  I gave her handout on our local gym which I think may be a good social outlet for her.  Future BMP to recheck sodium placed.  Referral to audiology  Blood pressure not controlled on recheck. Recheck with nurse in 2 weeks.  She is rate and rhythm controlled on exam today.  Fasting labs collected.  Check vitamin D given history of osteoporosis.  Not yet due for DEXA.  No bleeding.  Check iron and CBC levels  Patient to follow up in 1 year for annual exam or sooner if needed.  Kyarah Enamorado M. GLajuana Ripple DO

## 2022-02-10 NOTE — Patient Instructions (Signed)
You had labs performed today.  You will be contacted with the results of the labs once they are available, usually in the next 3 business days for routine lab work.  If you have an active my chart account, they will be released to your MyChart.  If you prefer to have these labs released to you via telephone, please let us know.  Meds ordered this encounter  Medications   gabapentin (NEURONTIN) 100 MG capsule    Sig: Take 1 capsule (100 mg total) by mouth 2 (two) times daily as needed.    Dispense:  200 capsule    Refill:  3   pravastatin (PRAVACHOL) 20 MG tablet    Sig: Take 1 tablet (20 mg total) by mouth daily.    Dispense:  100 tablet    Refill:  3    Please send a replace/new response with 100-Day Supply if appropriate to maximize member benefit.

## 2022-02-11 LAB — CMP14+EGFR
ALT: 12 IU/L (ref 0–32)
AST: 18 IU/L (ref 0–40)
Albumin/Globulin Ratio: 2.3 — ABNORMAL HIGH (ref 1.2–2.2)
Albumin: 4.6 g/dL (ref 3.8–4.8)
Alkaline Phosphatase: 74 IU/L (ref 44–121)
BUN/Creatinine Ratio: 17 (ref 12–28)
BUN: 10 mg/dL (ref 8–27)
Bilirubin Total: 0.4 mg/dL (ref 0.0–1.2)
CO2: 24 mmol/L (ref 20–29)
Calcium: 9.4 mg/dL (ref 8.7–10.3)
Chloride: 92 mmol/L — ABNORMAL LOW (ref 96–106)
Creatinine, Ser: 0.6 mg/dL (ref 0.57–1.00)
Globulin, Total: 2 g/dL (ref 1.5–4.5)
Glucose: 91 mg/dL (ref 70–99)
Potassium: 4.8 mmol/L (ref 3.5–5.2)
Sodium: 131 mmol/L — ABNORMAL LOW (ref 134–144)
Total Protein: 6.6 g/dL (ref 6.0–8.5)
eGFR: 93 mL/min/{1.73_m2} (ref >59)

## 2022-02-11 LAB — LIPID PANEL
Chol/HDL Ratio: 1.9 ratio (ref 0.0–4.4)
Cholesterol, Total: 175 mg/dL (ref 100–199)
HDL: 92 mg/dL (ref >39)
LDL Chol Calc (NIH): 74 mg/dL (ref 0–99)
Triglycerides: 39 mg/dL (ref 0–149)
VLDL Cholesterol Cal: 9 mg/dL (ref 5–40)

## 2022-02-11 LAB — CBC
Hematocrit: 36.5 % (ref 34.0–46.6)
Hemoglobin: 12.2 g/dL (ref 11.1–15.9)
MCH: 30.8 pg (ref 26.6–33.0)
MCHC: 33.4 g/dL (ref 31.5–35.7)
MCV: 92 fL (ref 79–97)
Platelets: 275 10*3/uL (ref 150–450)
RBC: 3.96 x10E6/uL (ref 3.77–5.28)
RDW: 11.8 % (ref 11.7–15.4)
WBC: 7.2 10*3/uL (ref 3.4–10.8)

## 2022-02-11 LAB — VITAMIN D 25 HYDROXY (VIT D DEFICIENCY, FRACTURES): Vit D, 25-Hydroxy: 98.5 ng/mL (ref 30.0–100.0)

## 2022-02-11 LAB — IRON: Iron: 88 ug/dL (ref 27–139)

## 2022-02-11 LAB — TSH: TSH: 2.75 u[IU]/mL (ref 0.450–4.500)

## 2022-03-03 ENCOUNTER — Ambulatory Visit: Payer: Medicare Other | Admitting: Audiologist

## 2022-03-10 ENCOUNTER — Other Ambulatory Visit: Payer: Self-pay | Admitting: Family Medicine

## 2022-03-10 DIAGNOSIS — Z1231 Encounter for screening mammogram for malignant neoplasm of breast: Secondary | ICD-10-CM

## 2022-04-13 ENCOUNTER — Ambulatory Visit
Admission: RE | Admit: 2022-04-13 | Discharge: 2022-04-13 | Disposition: A | Discharge status: Home / Self Care | Payer: Medicare Other | Source: Ambulatory Visit | Attending: Family Medicine | Admitting: Family Medicine

## 2022-04-13 DIAGNOSIS — Z1231 Encounter for screening mammogram for malignant neoplasm of breast: Secondary | ICD-10-CM | POA: Diagnosis not present

## 2022-05-17 ENCOUNTER — Telehealth: Payer: Self-pay | Admitting: Family Medicine

## 2022-05-17 DIAGNOSIS — M79672 Pain in left foot: Secondary | ICD-10-CM

## 2022-05-17 NOTE — Telephone Encounter (Signed)
REFERRAL REQUEST Telephone Note  Have you been seen at our office for this problem? Pain bottom of left foot (Advise that they may need an appointment with their PCP before a referral can be done)  Reason for Referral: podiatrist-pain bottom of left foot Referral discussed with patient: yes  Best contact number of patient for referral team: [phone]    Has patient been seen by a specialist for this issue before: no  Patient provider preference for referral: [name] if not where ever [name] recommends Patient location preference for referral: [name] if not where ever [name] recommends   Patient notified that referrals can take up to a week or longer to process. If they haven't heard anything within a week they should call back and speak with the referral department.

## 2022-05-21 ENCOUNTER — Encounter: Payer: Self-pay | Admitting: Podiatry

## 2022-05-21 ENCOUNTER — Ambulatory Visit: Payer: Medicare Other | Admitting: Podiatry

## 2022-05-21 ENCOUNTER — Ambulatory Visit (INDEPENDENT_AMBULATORY_CARE_PROVIDER_SITE_OTHER): Payer: Medicare Other

## 2022-05-21 DIAGNOSIS — Q6671 Congenital pes cavus, right foot: Secondary | ICD-10-CM

## 2022-05-21 DIAGNOSIS — Q6672 Congenital pes cavus, left foot: Secondary | ICD-10-CM

## 2022-05-21 DIAGNOSIS — M67472 Ganglion, left ankle and foot: Secondary | ICD-10-CM

## 2022-05-21 DIAGNOSIS — M79672 Pain in left foot: Secondary | ICD-10-CM | POA: Diagnosis not present

## 2022-05-21 DIAGNOSIS — M778 Other enthesopathies, not elsewhere classified: Secondary | ICD-10-CM

## 2022-05-21 DIAGNOSIS — M792 Neuralgia and neuritis, unspecified: Secondary | ICD-10-CM

## 2022-05-21 DIAGNOSIS — L851 Acquired keratosis [keratoderma] palmaris et plantaris: Secondary | ICD-10-CM | POA: Diagnosis not present

## 2022-05-21 NOTE — Progress Notes (Signed)
  Subjective:  Patient ID: Mary [name], female    DOB: 08/21/1945,   MRN: [id number]  Chief Complaint  Patient presents with   Foot Pain    Foot pain, left. Lateral aspect of left arch. Bursa sack to left ankle.    Callouses    Corn to ball of left foot     77 y.o. female presents for concern of pain on the outside of her let foot that started several weeks ago. Does relates she has not had any pain since Sunday. Relates the pain was stabbing and shooing on the lateral part of her left foot in the outside arch. Does relates a history of a cyst on the outside of her ankle that has never caused her pain. Also relates a corn on the bottom of her foot that does give her pain and she tries to trim regularly.  . Denies any other pedal complaints. Denies n/v/f/c.   Past Medical History:  Diagnosis Date   Age-related osteoporosis without current pathological fracture    Anemia    Anxiety    Arthritis    Cataract    Hypertension    Hyponatremia 08/20/2014   Related to HCTZ, advised by prior MD to stop the HCTZ, she is convinced that she needs it to control her BP Sodium as low as 127 on 06/21/14   Irregular heart beat     Objective:  Physical Exam: Vascular: DP/PT pulses 2/4 bilateral. CFT <3 seconds. Normal hair growth on digits. No edema.  Skin. No lacerations or abrasions bilateral feet. Hyperkeratoti cored lesion noted left second plantar metatarsal head.  Latera ankle soft tissue mass fluctuant and non mobile without tenderness to palpation.  Musculoskeletal: MMT 5/5 bilateral lower extremities in DF, PF, Inversion and Eversion. Deceased ROM in DF of ankle joint. No pain to palpation along peroneal tendons or along lateral fifth metatrsal.  Neurological: Sensation intact to light touch.   Assessment:   1. Pes cavus of both feet   2. Neuritis   3. Ganglion cyst of left foot   4. Acquired plantar porokeratosis      Plan:  Patient was evaluated and treated and all questions  answered. -Discussed porokeratosis, ganglion cysts and possible neuritis and pes cavus.   with patient and treatment options.  -Hyperkeratotic tissue was debrided with chisel without incident.  -Applied salycylic acid treatment to area with dressing. Advised to remove bandaging tomorrow.  -Encouraged daily moisturizing -Discussed use of pumice stone -Advised good supportive shoes and inserts -Discussed neuritis and lateral foot pain that likely may have been from irritated the nerve in that area possible the ganglion but as is better advised continuing to monitor and if becomes more of a problem may need to drain ganglion or look for possible other cause of pain. Discussed also pes cavus and strain on outside of foot that could cause issues.  -X-rays reviewed with patient and no acute fractures or dislocatiosn. Pes cavus noted.  -Patient to return to office as needed or sooner if condition worsens.   Lorenda Peck, DPM

## 2022-05-28 ENCOUNTER — Ambulatory Visit (INDEPENDENT_AMBULATORY_CARE_PROVIDER_SITE_OTHER): Payer: Medicare Other

## 2022-05-28 VITALS — Ht 63.0 in | Wt 102.0 lb

## 2022-05-28 DIAGNOSIS — Z0000 Encounter for general adult medical examination without abnormal findings: Secondary | ICD-10-CM | POA: Diagnosis not present

## 2022-05-28 DIAGNOSIS — Z780 Asymptomatic menopausal state: Secondary | ICD-10-CM

## 2022-05-28 NOTE — Progress Notes (Signed)
Subjective:   Mary [name] is a 77 y.o. female who presents for Medicare Annual (Subsequent) preventive examination. I connected with  [name] on 05/28/22 by a audio enabled telemedicine application and verified that I am speaking with the correct person using two identifiers.  Patient Location: Home  Provider Location: Home Office  I discussed the limitations of evaluation and management by telemedicine. The patient expressed understanding and agreed to proceed.  Review of Systems     Cardiac Risk Factors include: advanced age (>20mn, >>106women)     Objective:    Today's Vitals   05/28/22 1320  Weight: 102 lb (46.3 kg)  Height: '5\' 3"'$  (1.6 m)   Body mass index is 18.07 kg/m.     05/28/2022    1:25 PM 05/27/2021    2:49 PM 05/26/2020    2:56 PM 07/08/2019   11:34 AM 01/22/2019    2:26 PM 01/20/2018    3:08 PM  Advanced Directives  Does Patient Have a Medical Advance Directive? Yes Yes No No Yes Yes  Type of AParamedicof [place]   [place] Living will;Healthcare Power of Attorney  Does patient want to make changes to medical advance directive? No - Patient declined    No - Patient declined No - Patient declined  Copy of [place] Chart? Yes - validated most recent copy scanned in chart (See row information) No - copy requested   No - copy requested No - copy requested  Would patient like information on creating a medical advance directive?   No - Patient declined No - Patient declined      Current Medications (verified) Outpatient Encounter Medications as of 05/28/2022  Medication Sig   amLODipine (NORVASC) 5 MG tablet Take 1 tablet (5 mg total) by mouth daily.   aspirin EC 81 MG tablet Take 1 tablet (81 mg total) by mouth daily. Swallow whole.   Cholecalciferol (VITAMIN D3) 125 MCG (5000 UT) CAPS Take 5,000 Units by mouth.   ferrous  sulfate 325 (65 FE) MG tablet Take 325 mg by mouth daily with breakfast.   flecainide (TAMBOCOR) 100 MG tablet Take 1 tablet (100 mg total) by mouth 2 (two) times daily.   gabapentin (NEURONTIN) 100 MG capsule Take 1 capsule (100 mg total) by mouth 2 (two) times daily as needed.   irbesartan (AVAPRO) 300 MG tablet Take 1 tablet (300 mg total) by mouth daily.   metoprolol succinate (TOPROL-XL) 50 MG 24 hr tablet Take 1 tablet (50 mg total) by mouth daily.   Multiple Vitamins-Minerals (MULTIVITAMIN WITH MINERALS) tablet Take 1 tablet by mouth daily.   pravastatin (PRAVACHOL) 20 MG tablet Take 1 tablet (20 mg total) by mouth daily.   No facility-administered encounter medications on file as of 05/28/2022.    Allergies (verified) Fosamax [alendronate sodium], Sertraline, and Sulfa antibiotics   History: Past Medical History:  Diagnosis Date   Age-related osteoporosis without current pathological fracture    Anemia    Anxiety    Arthritis    Cataract    Hypertension    Hyponatremia 08/20/2014   Related to HCTZ, advised by prior MD to stop the HCTZ, she is convinced that she needs it to control her BP Sodium as low as 127 on 06/21/14   Irregular heart beat    Past Surgical History:  Procedure Laterality Date   ABDOMINAL HYSTERECTOMY     APPENDECTOMY  KNEE ARTHROSCOPY     TONSILLECTOMY     Family History  Problem Relation Age of Onset   Arthritis Mother    CAD Mother 75   Hypertension Mother    Hyperlipidemia Mother    Kidney disease Mother    Heart disease Mother    Hypertension Brother    Stroke Maternal Grandmother    Heart disease Maternal Grandfather    Arthritis Paternal Grandmother    Breast cancer Neg Hx    Social History   Socioeconomic History   Marital status: Widowed    Spouse name: Not on file   Number of children: 3   Years of education: 47   Highest education level: 12th grade  Occupational History   Occupation: retired  Tobacco Use   Smoking status:  Never   Smokeless tobacco: Never  Vaping Use   Vaping Use: Never used  Substance and Sexual Activity   Alcohol use: Yes    Alcohol/week: 7.0 standard drinks of alcohol    Types: 7 Glasses of wine per week   Drug use: No   Sexual activity: Not Currently  Other Topics Concern   Not on file  Social History Narrative   Lives alone. Two sons and a daughter.  Widow.     Social Determinants of Health   Financial Resource Strain: Low Risk  (05/28/2022)   Overall Financial Resource Strain (CARDIA)    Difficulty of Paying Living Expenses: Not hard at all  Food Insecurity: No Food Insecurity (05/28/2022)   Hunger Vital Sign    Worried About Running Out of Food in the Last Year: Never true    Ran Out of Food in the Last Year: Never true  Transportation Needs: No Transportation Needs (05/28/2022)   PRAPARE - Hydrologist (Medical): No    Lack of Transportation (Non-Medical): No  Physical Activity: Sufficiently Active (05/28/2022)   Exercise Vital Sign    Days of Exercise per Week: 7 days    Minutes of Exercise per Session: 60 min  Stress: No Stress Concern Present (05/28/2022)   [place]    Feeling of Stress : Not at all  Social Connections: Moderately Isolated (05/28/2022)   Social Connection and Isolation Panel [NHANES]    Frequency of Communication with Friends and Family: More than three times a week    Frequency of Social Gatherings with Friends and Family: More than three times a week    Attends Religious Services: More than 4 times per year    Active Member of [employer] or Organizations: No    Attends Archivist Meetings: Never    Marital Status: Widowed    Tobacco Counseling Counseling given: Not Answered   Clinical Intake:  Pre-visit preparation completed: Yes  Pain : No/denies pain     Nutritional Risks: None Diabetes: No  How often do you need to have someone help you  when you read instructions, pamphlets, or other written materials from your doctor or pharmacy?: 1 - Never  Diabetic?no   Interpreter Needed?: No  Information entered by :: Jadene Pierini, LPN   Activities of Daily Living    05/28/2022    1:24 PM 05/24/2022    9:51 AM  In your present state of health, do you have any difficulty performing the following activities:  Hearing? 0 0  Vision? 0 0  Difficulty concentrating or making decisions? 0 0  Walking or climbing stairs? 0 0  Dressing or bathing? 0 0  Doing errands, shopping? 0 0  Preparing Food and eating ? N N  Using the Toilet? N N  In the past six months, have you accidently leaked urine? N N  Do you have problems with loss of bowel control? N N  Managing your Medications? N N  Managing your Finances? N N  Housekeeping or managing your Housekeeping? N N    Patient Care Team: Janora Norlander, DO as PCP - General (Family Medicine) Lavonna Monarch, MD (Inactive) as Consulting Physician (Dermatology)  Indicate any recent Medical Services you may have received from other than [facility] providers in the past year (date may be approximate).     Assessment:   This is a routine wellness examination for Mary [name].  Hearing/Vision screen Vision Screening - Comments:: Wears rx glasses - up to date with routine eye exams with  Dr.Lee   Dietary issues and exercise activities discussed: Current Exercise Habits: Home exercise routine, Type of exercise: walking, Time (Minutes): 60, Frequency (Times/Week): 7, Weekly Exercise (Minutes/Week): 420, Intensity: Mild, Exercise limited by: None identified   Goals Addressed             This Visit's Progress    DIET - INCREASE WATER INTAKE   On track    Try to drink 6-8 glasses of water daily.       Depression Screen    05/28/2022    1:23 PM 08/11/2021    9:51 AM 05/27/2021    2:47 PM 04/10/2021    1:34 PM 01/07/2021   10:13 AM 10/06/2020   11:01 AM 09/18/2020   12:09 PM  PHQ 2/9 Scores   PHQ - 2 Score 0 0 0 0 0 0 0    Fall Risk    05/28/2022    1:22 PM 05/24/2022    9:51 AM 08/11/2021    9:51 AM 05/27/2021    2:49 PM 05/26/2021    9:31 AM  [place] in the past year? 0 0 '1 1 1  '$ Number falls in past yr: 0 0 0 0 0  Injury with Fall? 0 0 0 0 0  Risk for fall due to : No Fall Risks  History of fall(s) History of fall(s)   Follow up Falls prevention discussed  Falls evaluation completed Falls prevention discussed     FALL RISK PREVENTION PERTAINING TO THE HOME:  Any stairs in or around the home? Yes  If so, are there any without handrails? No  Home free of loose throw rugs in walkways, pet beds, electrical cords, etc? Yes  Adequate lighting in your home to reduce risk of falls? Yes   ASSISTIVE DEVICES UTILIZED TO PREVENT FALLS:  Life alert? No  Use of a cane, walker or w/c? No  Grab bars in the bathroom? Yes  Shower chair or bench in shower? Yes  Elevated toilet seat or a handicapped toilet? Yes       01/20/2018    3:27 PM  MMSE - Mini Mental State Exam  Orientation to time 5  Orientation to Place 5  Registration 3  Attention/ Calculation 5  Recall 3  Language- name 2 objects 2  Language- repeat 1  Language- follow 3 step command 3  Language- read & follow direction 1  Write a sentence 1  Copy design 1  Total score 30        05/28/2022    1:25 PM 05/26/2020    2:58 PM 01/22/2019  2:29 PM  6CIT Screen  What Year? 0 points 0 points 0 points  What month? 0 points 0 points 0 points  What time? 0 points 0 points 0 points  Count back from 20 0 points 0 points 0 points  Months in reverse 0 points 0 points 0 points  Repeat phrase 0 points 2 points 2 points  Total Score 0 points 2 points 2 points    Immunizations Immunization History  Administered Date(s) Administered   Fluad Quad(high Dose 65+) 02/05/2020, 04/10/2021, 02/10/2022   Influenza Split 06/13/2013   Influenza, High Dose Seasonal PF 06/06/2012   Influenza, Seasonal, Injecte,  Preservative Fre 03/31/2015   Influenza,inj,Quad PF,6+ Mos 03/31/2015   Influenza,trivalent, recombinat, inj, PF 06/13/2013   PFIZER(Purple Top)SARS-COV-2 Vaccination 11/30/2019, 12/21/2019   Pneumococcal Conjugate-13 06/13/2013   Pneumococcal Polysaccharide-23 04/17/2010   Pneumococcal-Unspecified 04/17/2010   Td 04/27/2003   Td (Adult), 2 Lf Tetanus Toxid, Preservative Free 04/27/2003   Tdap 06/13/2013    TDAP status: Up to date  Flu Vaccine status: Up to date  Pneumococcal vaccine status: Up to date  Covid-19 vaccine status: Completed vaccines  Qualifies for Shingles Vaccine? Yes   Zostavax completed No   Shingrix Completed?: No.    Education has been provided regarding the importance of this vaccine. Patient has been advised to call insurance company to determine out of pocket expense if they have not yet received this vaccine. Advised may also receive vaccine at local pharmacy or Health Dept. Verbalized acceptance and understanding.  Screening Tests Health Maintenance  Topic Date Due   Zoster Vaccines- Shingrix (1 of 2) Never done   COVID-19 Vaccine (3 - Pfizer risk series) 01/18/2020   DEXA SCAN  05/08/2022   MAMMOGRAM  04/14/2023   Medicare Annual Wellness (AWV)  05/29/2023   DTaP/Tdap/Td (4 - Td or Tdap) 06/14/2023   Pneumonia Vaccine 64+ Years old  Completed   INFLUENZA VACCINE  Completed   Hepatitis C Screening  Completed   HPV VACCINES  Aged Out   COLONOSCOPY (Pts 45-95yr Insurance coverage will need to be confirmed)  [place] Due  Topic Date Due   Zoster Vaccines- Shingrix (1 of 2) Never done   COVID-19 Vaccine (3 - Pfizer risk series) 01/18/2020   DEXA SCAN  05/08/2022    Colorectal cancer screening: No longer required.   Mammogram status: No longer required due to age.  Bone Density status: Ordered 05/28/2022. Pt provided with contact info and advised to call to schedule appt.  Lung Cancer Screening:  (Low Dose CT Chest recommended if Age 78-80years, 30 pack-year currently smoking OR have quit w/in 15years.) does not qualify.   Lung Cancer Screening Referral: n/a  Additional Screening:  Hepatitis C Screening: does not qualify;   Vision Screening: Recommended annual ophthalmology exams for early detection of glaucoma and other disorders of the eye. Is the patient up to date with their annual eye exam?  Yes  Who is the provider or what is the name of the office in which the patient attends annual eye exams? Dr.Lee  If pt is not established with a provider, would they like to be referred to a provider to establish care? No .   Dental Screening: Recommended annual dental exams for proper oral hygiene  Community Resource Referral / Chronic Care Management: CRR required this visit?  No   CCM required this visit?  No      Plan:     I  have personally reviewed and noted the following in the patient's chart:   Medical and social history Use of alcohol, tobacco or illicit drugs  Current medications and supplements including opioid prescriptions. Patient is not currently taking opioid prescriptions. Functional ability and status Nutritional status Physical activity Advanced directives List of other physicians Hospitalizations, surgeries, and ER visits in previous 12 months Vitals Screenings to include cognitive, depression, and falls Referrals and appointments  In addition, I have reviewed and discussed with patient certain preventive protocols, quality metrics, and best practice recommendations. A written personalized care plan for preventive services as well as general preventive health recommendations were provided to patient.     Daphane Shepherd, LPN   0/0/7622   Nurse Notes: referral DEXA 05/28/2022

## 2022-05-28 NOTE — Patient Instructions (Signed)
Mary [name] , Thank you for taking time to come for your Medicare Wellness Visit. I appreciate your ongoing commitment to your health goals. Please review the following plan we discussed and let me know if I can assist you in the future.   These are the goals we discussed:  Goals      DIET - INCREASE WATER INTAKE     Try to drink 6-8 glasses of water daily.     Exercise 150 min/wk Moderate Activity     Patient Stated     05/27/2021 AWV Goal: Fall Prevention  Over the next year, patient will decrease their risk for falls by: Using assistive devices, such as a cane or walker, as needed Identifying fall risks within their home and correcting them by: Removing throw rugs Adding handrails to stairs or ramps Removing clutter and keeping a clear pathway throughout the home Increasing light, especially at night Adding shower handles/bars Raising toilet seat Identifying potential personal risk factors for falls: Medication side effects Incontinence/urgency Vestibular dysfunction Hearing loss Musculoskeletal disorders Neurological disorders Orthostatic hypotension          This is a list of the screening recommended for you and due dates:  Health Maintenance  Topic Date Due   Zoster (Shingles) Vaccine (1 of 2) Never done   COVID-19 Vaccine (3 - Pfizer risk series) 01/18/2020   DEXA scan (bone density measurement)  05/08/2022   Mammogram  04/14/2023   Medicare Annual Wellness Visit  05/29/2023   DTaP/Tdap/Td vaccine (4 - Td or Tdap) 06/14/2023   Pneumonia Vaccine  Completed   Flu Shot  Completed   Hepatitis C Screening: USPSTF Recommendation to screen - Ages 66-79 yo.  Completed   HPV Vaccine  Aged Out   Colon Cancer Screening  Discontinued    Advanced directives: In Chart   Conditions/risks identified: Aim for 30 minutes of exercise or brisk walking, 6-8 glasses of water, and 5 servings of fruits and vegetables each day.   Next appointment: Follow up in one year for your  annual wellness visit    Preventive Care 65 Years and Older, Female Preventive care refers to lifestyle choices and visits with your health care provider that can promote health and wellness. What does preventive care include? A yearly physical exam. This is also called an annual well check. Dental exams once or twice a year. Routine eye exams. Ask your health care provider how often you should have your eyes checked. Personal lifestyle choices, including: Daily care of your teeth and gums. Regular physical activity. Eating a healthy diet. Avoiding tobacco and drug use. Limiting alcohol use. Practicing safe sex. Taking low-dose aspirin every day. Taking vitamin and mineral supplements as recommended by your health care provider. What happens during an annual well check? The services and screenings done by your health care provider during your annual well check will depend on your age, overall health, lifestyle risk factors, and family history of disease. Counseling  Your health care provider may ask you questions about your: Alcohol use. Tobacco use. Drug use. Emotional well-being. Home and relationship well-being. Sexual activity. Eating habits. History of falls. Memory and ability to understand (cognition). Work and work Statistician. Reproductive health. Screening  You may have the following tests or measurements: Height, weight, and BMI. Blood pressure. Lipid and cholesterol levels. These may be checked every 5 years, or more frequently if you are over 43 years old. Skin check. Lung cancer screening. You may have this screening every year starting at  age 54 if you have a 30-pack-year history of smoking and currently smoke or have quit within the past 15 years. Fecal occult blood test (FOBT) of the stool. You may have this test every year starting at age 55. Flexible sigmoidoscopy or colonoscopy. You may have a sigmoidoscopy every 5 years or a colonoscopy every 10 years  starting at age 13. Hepatitis C blood test. Hepatitis B blood test. Sexually transmitted disease (STD) testing. Diabetes screening. This is done by checking your blood sugar (glucose) after you have not eaten for a while (fasting). You may have this done every 1-3 years. Bone density scan. This is done to screen for osteoporosis. You may have this done starting at age 28. Mammogram. This may be done every 1-2 years. Talk to your health care provider about how often you should have regular mammograms. Talk with your health care provider about your test results, treatment options, and if necessary, the need for more tests. Vaccines  Your health care provider may recommend certain vaccines, such as: Influenza vaccine. This is recommended every year. Tetanus, diphtheria, and acellular pertussis (Tdap, Td) vaccine. You may need a Td booster every 10 years. Zoster vaccine. You may need this after age 30. Pneumococcal 13-valent conjugate (PCV13) vaccine. One dose is recommended after age 63. Pneumococcal polysaccharide (PPSV23) vaccine. One dose is recommended after age 71. Talk to your health care provider about which screenings and vaccines you need and how often you need them. This information is not intended to replace advice given to you by your health care provider. Make sure you discuss any questions you have with your health care provider. Document Released: 05/09/2015 Document Revised: 12/31/2015 Document Reviewed: 02/11/2015 Elsevier Interactive Patient Education  2017 [place] Prevention in the Home Falls can cause injuries. They can happen to people of all ages. There are many things you can do to make your home safe and to help prevent falls. What can I do on the outside of my home? Regularly fix the edges of walkways and driveways and fix any cracks. Remove anything that might make you trip as you walk through a door, such as a raised step or threshold. Trim any bushes or  trees on the path to your home. Use bright outdoor lighting. Clear any walking paths of anything that might make someone trip, such as rocks or tools. Regularly check to see if handrails are loose or broken. Make sure that both sides of any steps have handrails. Any raised decks and porches should have guardrails on the edges. Have any leaves, snow, or ice cleared regularly. Use sand or salt on walking paths during winter. Clean up any spills in your garage right away. This includes oil or grease spills. What can I do in the bathroom? Use night lights. Install grab bars by the toilet and in the tub and shower. Do not use towel bars as grab bars. Use non-skid mats or decals in the tub or shower. If you need to sit down in the shower, use a plastic, non-slip stool. Keep the floor dry. Clean up any water that spills on the floor as soon as it happens. Remove soap buildup in the tub or shower regularly. Attach bath mats securely with double-sided non-slip rug tape. Do not have throw rugs and other things on the floor that can make you trip. What can I do in the bedroom? Use night lights. Make sure that you have a light by your bed that is easy to  reach. Do not use any sheets or blankets that are too big for your bed. They should not hang down onto the floor. Have a firm chair that has side arms. You can use this for support while you get dressed. Do not have throw rugs and other things on the floor that can make you trip. What can I do in the kitchen? Clean up any spills right away. Avoid walking on wet floors. Keep items that you use a lot in easy-to-reach places. If you need to reach something above you, use a strong step stool that has a grab bar. Keep electrical cords out of the way. Do not use floor polish or wax that makes floors slippery. If you must use wax, use non-skid floor wax. Do not have throw rugs and other things on the floor that can make you trip. What can I do with my  stairs? Do not leave any items on the stairs. Make sure that there are handrails on both sides of the stairs and use them. Fix handrails that are broken or loose. Make sure that handrails are as long as the stairways. Check any carpeting to make sure that it is firmly attached to the stairs. Fix any carpet that is loose or worn. Avoid having throw rugs at the top or bottom of the stairs. If you do have throw rugs, attach them to the floor with carpet tape. Make sure that you have a light switch at the top of the stairs and the bottom of the stairs. If you do not have them, ask someone to add them for you. What else can I do to help prevent falls? Wear shoes that: Do not have high heels. Have rubber bottoms. Are comfortable and fit you well. Are closed at the toe. Do not wear sandals. If you use a stepladder: Make sure that it is fully opened. Do not climb a closed stepladder. Make sure that both sides of the stepladder are locked into place. Ask someone to hold it for you, if possible. Clearly mark and make sure that you can see: Any grab bars or handrails. First and last steps. Where the edge of each step is. Use tools that help you move around (mobility aids) if they are needed. These include: Canes. Walkers. Scooters. Crutches. Turn on the lights when you go into a dark area. Replace any light bulbs as soon as they burn out. Set up your furniture so you have a clear path. Avoid moving your furniture around. If any of your floors are uneven, fix them. If there are any pets around you, be aware of where they are. Review your medicines with your doctor. Some medicines can make you feel dizzy. This can increase your chance of falling. Ask your doctor what other things that you can do to help prevent falls. This information is not intended to replace advice given to you by your health care provider. Make sure you discuss any questions you have with your health care provider. Document  Released: 02/06/2009 Document Revised: 09/18/2015 Document Reviewed: 05/17/2014 Elsevier Interactive Patient Education  2017 [employer].

## 2022-08-09 ENCOUNTER — Ambulatory Visit: Payer: Medicare Other | Admitting: Dermatology

## 2022-09-17 ENCOUNTER — Other Ambulatory Visit: Payer: Self-pay | Admitting: Cardiology

## 2022-11-19 ENCOUNTER — Encounter: Payer: Self-pay | Admitting: Family Medicine

## 2022-11-19 ENCOUNTER — Ambulatory Visit: Payer: Medicare Other | Admitting: Family Medicine

## 2022-11-19 VITALS — BP 153/75 | HR 63 | Temp 98.5°F | Ht 63.0 in | Wt 101.0 lb

## 2022-11-19 DIAGNOSIS — R5383 Other fatigue: Secondary | ICD-10-CM

## 2022-11-19 DIAGNOSIS — R634 Abnormal weight loss: Secondary | ICD-10-CM | POA: Diagnosis not present

## 2022-11-19 DIAGNOSIS — R7309 Other abnormal glucose: Secondary | ICD-10-CM | POA: Diagnosis not present

## 2022-11-19 DIAGNOSIS — R6889 Other general symptoms and signs: Secondary | ICD-10-CM | POA: Diagnosis not present

## 2022-11-19 DIAGNOSIS — R0989 Other specified symptoms and signs involving the circulatory and respiratory systems: Secondary | ICD-10-CM | POA: Diagnosis not present

## 2022-11-19 LAB — CMP14+EGFR
ALT: 11 IU/L (ref 0–32)
AST: 19 IU/L (ref 0–40)
Albumin: 4.7 g/dL (ref 3.8–4.8)
Alkaline Phosphatase: 74 IU/L (ref 44–121)
BUN/Creatinine Ratio: 17 (ref 12–28)
BUN: 10 mg/dL (ref 8–27)
Bilirubin Total: 0.5 mg/dL (ref 0.0–1.2)
CO2: 24 mmol/L (ref 20–29)
Calcium: 9.5 mg/dL (ref 8.7–10.3)
Chloride: 92 mmol/L — ABNORMAL LOW (ref 96–106)
Creatinine, Ser: 0.58 mg/dL (ref 0.57–1.00)
Globulin, Total: 1.9 g/dL (ref 1.5–4.5)
Glucose: 90 mg/dL (ref 70–99)
Potassium: 4 mmol/L (ref 3.5–5.2)
Sodium: 131 mmol/L — ABNORMAL LOW (ref 134–144)
Total Protein: 6.6 g/dL (ref 6.0–8.5)
eGFR: 93 mL/min/{1.73_m2} (ref >59)

## 2022-11-19 LAB — CBC WITH DIFFERENTIAL/PLATELET
Basophils Absolute: 0.1 10*3/uL (ref 0.0–0.2)
Basos: 1 %
EOS (ABSOLUTE): 0.1 10*3/uL (ref 0.0–0.4)
Eos: 2 %
Hematocrit: 37.6 % (ref 34.0–46.6)
Hemoglobin: 12.6 g/dL (ref 11.1–15.9)
Immature Grans (Abs): 0 10*3/uL (ref 0.0–0.1)
Immature Granulocytes: 0 %
Lymphocytes Absolute: 1.2 10*3/uL (ref 0.7–3.1)
MCH: 30.3 pg (ref 26.6–33.0)
MCHC: 33.5 g/dL (ref 31.5–35.7)
MCV: 90 fL (ref 79–97)
Monocytes Absolute: 1 10*3/uL — ABNORMAL HIGH (ref 0.1–0.9)
Monocytes: 15 %
Neutrophils Absolute: 4.2 10*3/uL (ref 1.4–7.0)
Neutrophils: 63 %
Platelets: 283 10*3/uL (ref 150–450)
RBC: 4.16 x10E6/uL (ref 3.77–5.28)
RDW: 11.6 % — ABNORMAL LOW (ref 11.7–15.4)
WBC: 6.6 10*3/uL (ref 3.4–10.8)

## 2022-11-19 LAB — IRON,TIBC AND FERRITIN PANEL

## 2022-11-19 LAB — URINALYSIS, ROUTINE W REFLEX MICROSCOPIC
Bilirubin, UA: NEGATIVE
Glucose, UA: NEGATIVE
Ketones, UA: NEGATIVE
Leukocytes,UA: NEGATIVE
Nitrite, UA: NEGATIVE
Protein,UA: NEGATIVE
Specific Gravity, UA: 1.015 (ref 1.005–1.030)
Urobilinogen, Ur: 0.2 mg/dL (ref 0.2–1.0)
pH, UA: 6.5 (ref 5.0–7.5)

## 2022-11-19 LAB — TSH: TSH: 2.53 u[IU]/mL (ref 0.450–4.500)

## 2022-11-19 LAB — MICROSCOPIC EXAMINATION: Renal Epithel, UA: NONE SEEN /hpf

## 2022-11-19 LAB — T4, FREE

## 2022-11-19 LAB — BAYER DCA HB A1C WAIVED: HB A1C (BAYER DCA - WAIVED): 5 % (ref 4.8–5.6)

## 2022-11-19 NOTE — Progress Notes (Signed)
Subjective: CC: Weight loss, generalized fatigue PCP: Raliegh Ip, DO ZOX:[id number] Dauenhauer is a 77 y.o. female presenting to clinic today for:  1.  Unexpected weight loss, generalized fatigue Patient reports that over the last few weeks she simply has just been feeling worn out.  She does not report any chest pain, shortness of breath, heart palpitations, lower extremity edema, visual disturbance, balance issues etc.  She normally is able to walk and exercise regularly but she notes she simply gets tired now.  She reports good appetite and has a very balanced diet.  However she continues to lose weight.  She reports no hemoptysis, night sweats.  No rectal bleeding.  No breast masses.  No skin lesions of concern.  Her blood pressure has been fluctuating some where she will be systolics 114 but as high as 188.  She in fact she had a blood pressure of 188/100 recently from simply waking up.  She is compliant with Norvasc 5 mg daily, Tambocor 100 mg twice daily, a VaPro 300 mg daily, metoprolol 50 mg daily.  Next OV with cardiology is in August   ROS: Per HPI  Allergies  Allergen Reactions   Fosamax [Alendronate Sodium]     Dizziness, elevated blood pressure   Sertraline Nausea Only    Insomnia, did not feel well   Sulfa Antibiotics Nausea Only   Past Medical History:  Diagnosis Date   Age-related osteoporosis without current pathological fracture    Anemia    Anxiety    Arthritis    Cataract    Hypertension    Hyponatremia 08/20/2014   Related to HCTZ, advised by prior MD to stop the HCTZ, she is convinced that she needs it to control her BP Sodium as low as 127 on 06/21/14   Irregular heart beat     Current Outpatient Medications:    amLODipine (NORVASC) 5 MG tablet, Take 1 tablet (5 mg total) by mouth daily., Disp: 90 tablet, Rfl: 3   aspirin EC 81 MG tablet, Take 1 tablet (81 mg total) by mouth daily. Swallow whole., Disp: 90 tablet, Rfl: 3   Cholecalciferol (VITAMIN  D3) 125 MCG (5000 UT) CAPS, Take 5,000 Units by mouth., Disp: , Rfl:    ferrous sulfate 325 (65 FE) MG tablet, Take 325 mg by mouth daily with breakfast., Disp: , Rfl:    flecainide (TAMBOCOR) 100 MG tablet, TAKE 1 TABLET BY MOUTH TWICE  DAILY, Disp: 200 tablet, Rfl: 2   gabapentin (NEURONTIN) 100 MG capsule, Take 1 capsule (100 mg total) by mouth 2 (two) times daily as needed., Disp: 200 capsule, Rfl: 3   irbesartan (AVAPRO) 300 MG tablet, Take 1 tablet (300 mg total) by mouth daily., Disp: 90 tablet, Rfl: 3   metoprolol succinate (TOPROL-XL) 50 MG 24 hr tablet, Take 1 tablet (50 mg total) by mouth daily., Disp: 90 tablet, Rfl: 3   Multiple Vitamins-Minerals (MULTIVITAMIN WITH MINERALS) tablet, Take 1 tablet by mouth daily., Disp: , Rfl:    pravastatin (PRAVACHOL) 20 MG tablet, Take 1 tablet (20 mg total) by mouth daily., Disp: 100 tablet, Rfl: 3 Social History   Socioeconomic History   Marital status: Widowed    Spouse name: Not on file   Number of children: 3   Years of education: 12   Highest education level: Associate degree: occupational, Scientist, product/process development, or vocational program  Occupational History   Occupation: retired  Tobacco Use   Smoking status: Never   Smokeless tobacco: Never  Advertising account planner  Vaping status: Never Used  Substance and Sexual Activity   Alcohol use: Yes    Alcohol/week: 7.0 standard drinks of alcohol    Types: 7 Glasses of wine per week   Drug use: No   Sexual activity: Not Currently  Other Topics Concern   Not on file  Social History Narrative   Lives alone. Two sons and a daughter.  Widow.     Social Determinants of Health   Financial Resource Strain: Low Risk  (11/18/2022)   Overall Financial Resource Strain (CARDIA)    Difficulty of Paying Living Expenses: Not hard at all  Food Insecurity: No Food Insecurity (11/18/2022)   Hunger Vital Sign    Worried About Running Out of Food in the Last Year: Never true    Ran Out of Food in the Last Year: Never true   Transportation Needs: No Transportation Needs (11/18/2022)   PRAPARE - Administrator, Civil Service (Medical): No    Lack of Transportation (Non-Medical): No  Physical Activity: Sufficiently Active (11/18/2022)   Exercise Vital Sign    Days of Exercise per Week: 7 days    Minutes of Exercise per Session: 50 min  Stress: No Stress Concern Present (11/18/2022)   [employer] of Occupational Health - Occupational Stress Questionnaire    Feeling of Stress : Only a little  Social Connections: Moderately Isolated (11/18/2022)   Social Connection and Isolation Panel [NHANES]    Frequency of Communication with Friends and Family: More than three times a week    Frequency of Social Gatherings with Friends and Family: Once a week    Attends Religious Services: More than 4 times per year    Active Member of [employer] or Organizations: No    Attends Banker Meetings: Never    Marital Status: Widowed  Intimate Partner Violence: Not At Risk (05/28/2022)   Humiliation, Afraid, Rape, and Kick questionnaire    Fear of Current or Ex-Partner: No    Emotionally Abused: No    Physically Abused: No    Sexually Abused: No   Family History  Problem Relation Age of Onset   Arthritis Mother    CAD Mother 42   Hypertension Mother    Hyperlipidemia Mother    Kidney disease Mother    Heart disease Mother    Hypertension Brother    Stroke Maternal Grandmother    Heart disease Maternal Grandfather    Arthritis Paternal Grandmother    Breast cancer Neg Hx     Objective: Office vital signs reviewed. BP (!) 153/75   Pulse 63   Temp 98.5 F (36.9 C)   Ht 5\' 3"  (1.6 m)   Wt 101 lb (45.8 kg)   SpO2 99%   BMI 17.89 kg/m   Physical Examination:  General: Awake, alert, thin female, No acute distress HEENT: No thyromegaly.  No thyroid nodules.  No cervical lymphadenopathy, supraclavicular lymphadenopathy or axillary lymphadenopathy. Cardio: regular rate and rhythm, S1S2 heard, no  murmurs appreciated Pulm: clear to auscultation bilaterally, no wheezes, rhonchi or rales; normal work of breathing on room air GI: soft, non-tender, non-distended, bowel sounds present x4, no hepatomegaly, no splenomegaly, no masses GU: No suprapubic tenderness palpation.  No palpable abdominal masses MSK: Normal gait and station     11/19/2022    8:20 AM 05/28/2022    1:23 PM 08/11/2021    9:51 AM  Depression screen PHQ 2/9  Decreased Interest 0 0 0  Down, Depressed, Hopeless 0 0 0  PHQ - 2 Score 0 0 0  Altered sleeping 0    Tired, decreased energy 0    Change in appetite 0    Feeling bad or failure about yourself  0    Trouble concentrating 0    Moving slowly or fidgety/restless 0    Suicidal thoughts 0    PHQ-9 Score 0    Difficult doing work/chores Not difficult at all        11/19/2022    8:20 AM 08/11/2021    9:52 AM 04/10/2021    1:34 PM 01/07/2021   10:13 AM  GAD 7 : Generalized Anxiety Score  Nervous, Anxious, on Edge 0 0 0 0  Control/stop worrying 0 0 0 0  Worry too much - different things 0 0 0 0  Trouble relaxing 2 0 0 0  Restless 0 0 0 0  Easily annoyed or irritable 0 0 0 0  Afraid - awful might happen 0 0 0 0  Total GAD 7 Score 2 0 0 0  Anxiety Difficulty Not difficult at all Not difficult at all Not difficult at all Not difficult at all    Assessment/ Plan: 77 y.o. female   Unexplained weight loss - Plan: TSH, CMP14+EGFR, T4, Free, CBC with Differential, Fecal occult blood, imunochemical([facility]), DG Chest 2 View, Iron, TIBC and Ferritin Panel, Urinalysis, Routine w reflex microscopic, Bayer DCA Hb A1c Waived  Fatigue, unspecified type - Plan: TSH, CMP14+EGFR, T4, Free, CBC with Differential, Fecal occult blood, imunochemical([facility]), DG Chest 2 View, Iron, TIBC and Ferritin Panel, Urinalysis, Routine w reflex microscopic, Bayer DCA Hb A1c Waived  Labile blood pressure  Uncertain etiology of unexplained weight loss.  She has suffered  with some loneliness and depression in the past but this does not seem to be the cause of her symptoms.  She is always been able to self manage that.  Going to look for metabolic etiology.  I like plain films of the chest as well as FOBT to ensure that we do not have an insidious bleed.  So far labs are unrevealing with normal urine and normal A1c.  Blood pressure is mildly elevated today but given reports of BPs of 114/85, I hesitate to advance her amlodipine for fear of medication induced hypotension.  Instead, would like her to be seen again in the next couple of weeks for recheck blood pressure and weight check  No orders of the defined types were placed in this encounter.  No orders of the defined types were placed in this encounter.    Raliegh Ip, DO [facility] [phone]

## 2022-11-20 LAB — CBC WITH DIFFERENTIAL/PLATELET: Lymphs: 19 %

## 2022-11-22 ENCOUNTER — Other Ambulatory Visit: Payer: Self-pay | Admitting: Family Medicine

## 2022-11-22 ENCOUNTER — Ambulatory Visit (INDEPENDENT_AMBULATORY_CARE_PROVIDER_SITE_OTHER): Payer: Medicare Other

## 2022-11-22 DIAGNOSIS — R5383 Other fatigue: Secondary | ICD-10-CM

## 2022-11-22 DIAGNOSIS — R634 Abnormal weight loss: Secondary | ICD-10-CM

## 2022-11-22 DIAGNOSIS — I700 Atherosclerosis of aorta: Secondary | ICD-10-CM | POA: Diagnosis not present

## 2022-11-22 DIAGNOSIS — I77810 Thoracic aortic ectasia: Secondary | ICD-10-CM | POA: Diagnosis not present

## 2022-11-22 DIAGNOSIS — R0989 Other specified symptoms and signs involving the circulatory and respiratory systems: Secondary | ICD-10-CM

## 2022-12-02 ENCOUNTER — Ambulatory Visit (HOSPITAL_COMMUNITY)
Admission: RE | Admit: 2022-12-02 | Discharge: 2022-12-02 | Disposition: A | Discharge status: Home / Self Care | Payer: Medicare Other | Source: Ambulatory Visit | Attending: Family Medicine | Admitting: Family Medicine

## 2022-12-02 DIAGNOSIS — R0989 Other specified symptoms and signs involving the circulatory and respiratory systems: Secondary | ICD-10-CM | POA: Diagnosis not present

## 2022-12-02 DIAGNOSIS — R634 Abnormal weight loss: Secondary | ICD-10-CM

## 2022-12-02 DIAGNOSIS — R5383 Other fatigue: Secondary | ICD-10-CM

## 2022-12-02 DIAGNOSIS — I700 Atherosclerosis of aorta: Secondary | ICD-10-CM | POA: Diagnosis not present

## 2022-12-07 ENCOUNTER — Other Ambulatory Visit: Payer: Self-pay | Admitting: Cardiology

## 2022-12-07 DIAGNOSIS — I10 Essential (primary) hypertension: Secondary | ICD-10-CM

## 2022-12-28 ENCOUNTER — Other Ambulatory Visit: Payer: Self-pay | Admitting: Cardiology

## 2022-12-28 DIAGNOSIS — I10 Essential (primary) hypertension: Secondary | ICD-10-CM

## 2023-01-07 IMAGING — CT CT MAXILLOFACIAL W/O CM
3 of 4 series · 15 of 47 positions shown, 18 images · non-contrast
Comparison: None.

CLINICAL DATA: Trauma

EXAM:
CT MAXILLOFACIAL WITHOUT CONTRAST
TECHNIQUE: Multidetector CT imaging of the maxillofacial structures was
performed. Multiplanar CT image reconstructions were also generated.

[Series 2: max soft · axial · 0.38mm/px · z∈[-47,+105]mm · 9 of 90 slices shown, 12 images]
[im 7/90  brain]
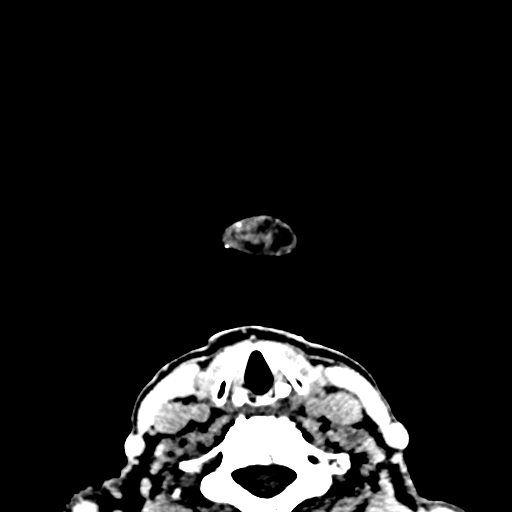
[im 7/90  bone]
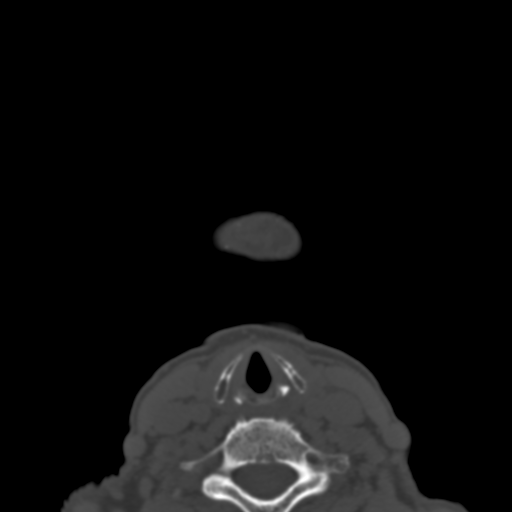
[im 20/90  bone]
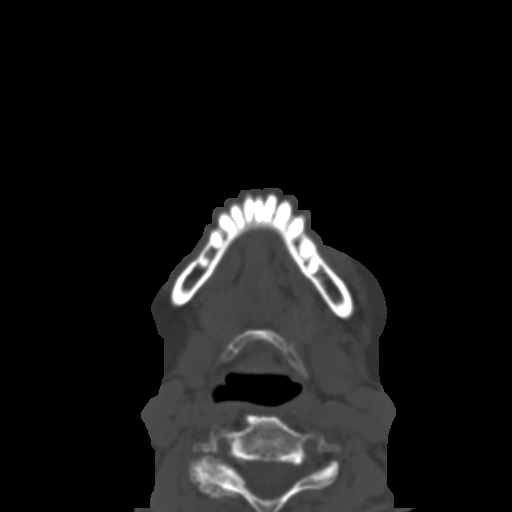
[im 26/90  bone]
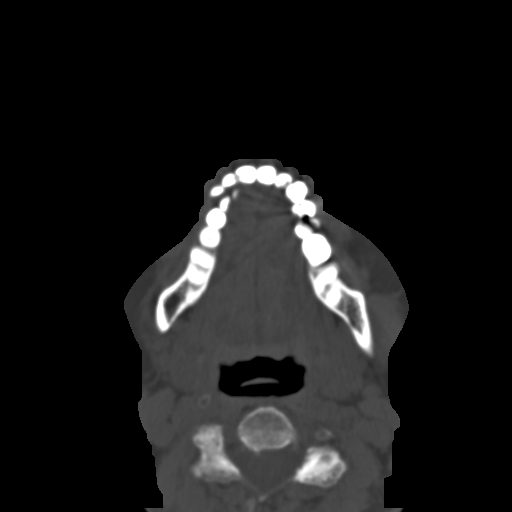
[im 39/90  bone]
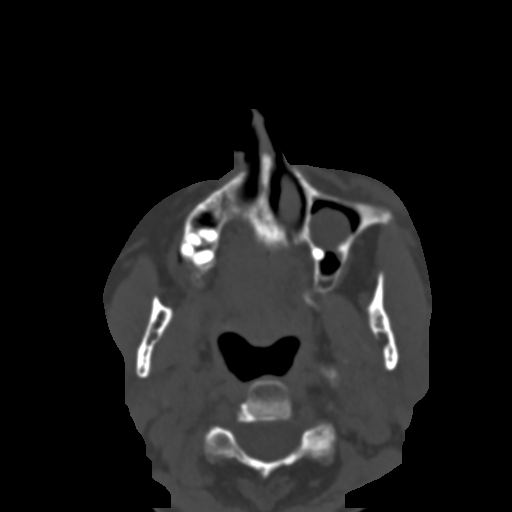
[im 45/90  brain]
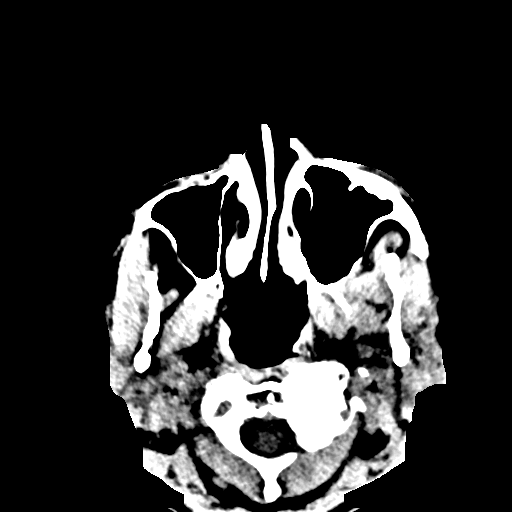
[im 45/90  bone]
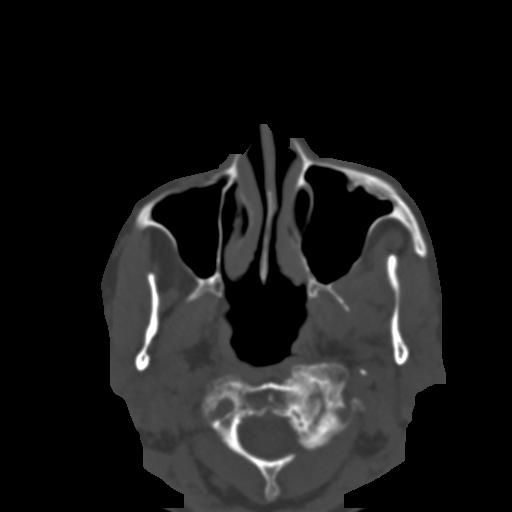
[im 51/90  bone]
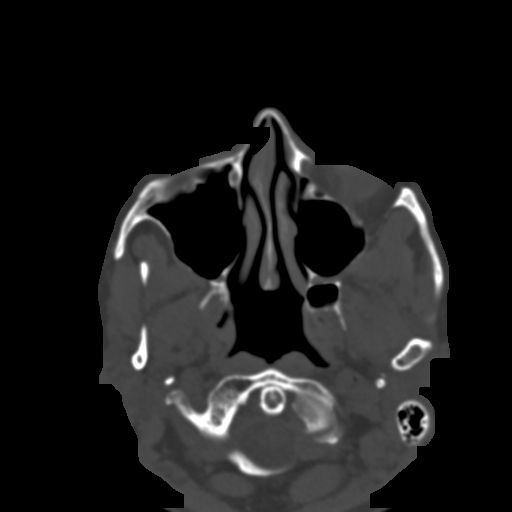
[im 64/90  bone]
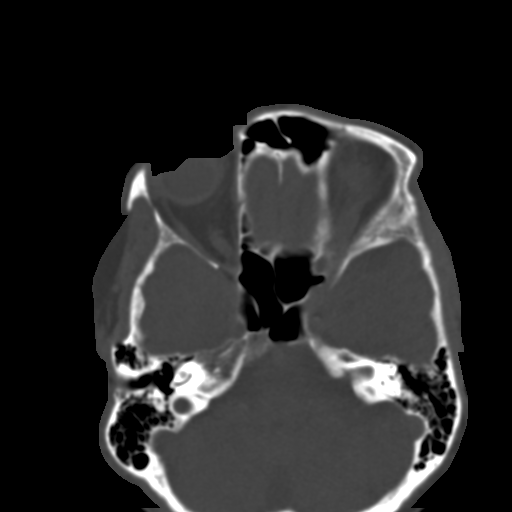
[im 70/90  bone]
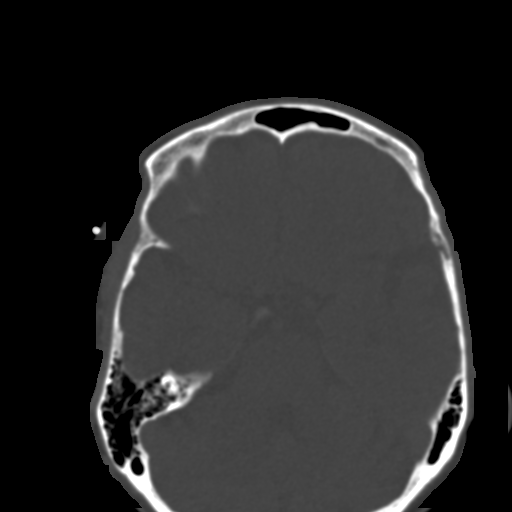
[im 83/90  brain]
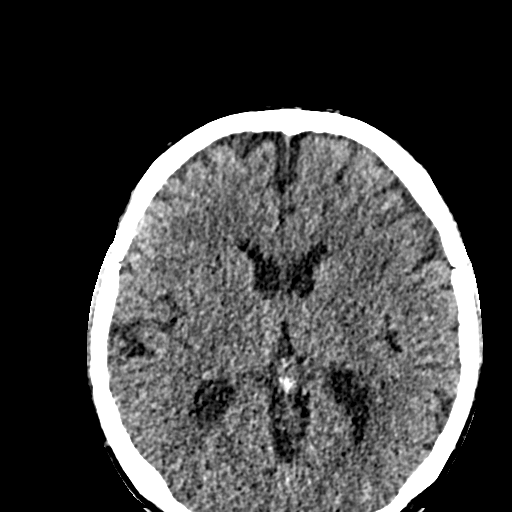
[im 83/90  bone]
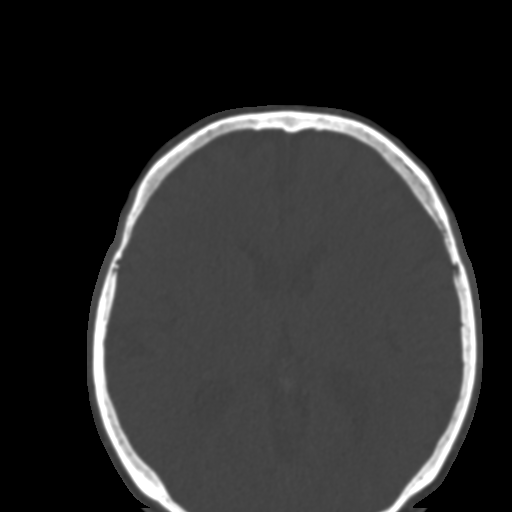

[Series 6: coronal soft · coronal · 0.35mm/px · 3 of 68 slices shown]
[im 23/68  bone]
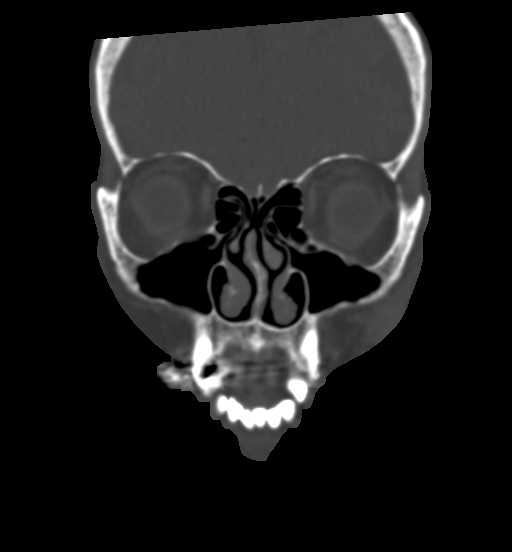
[im 30/68  bone]
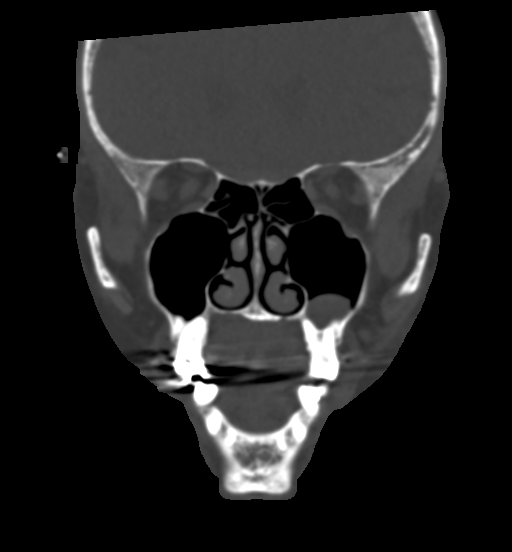
[im 38/68  bone]
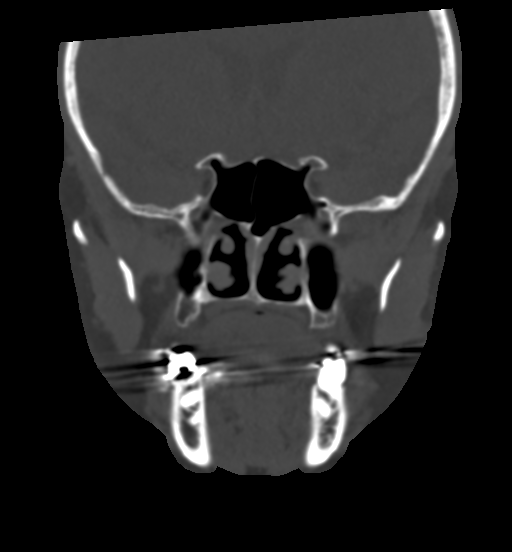

[Series 7: sagittal soft · sagittal · 0.26mm/px · 3 of 87 slices shown]
[im 29/87  bone]
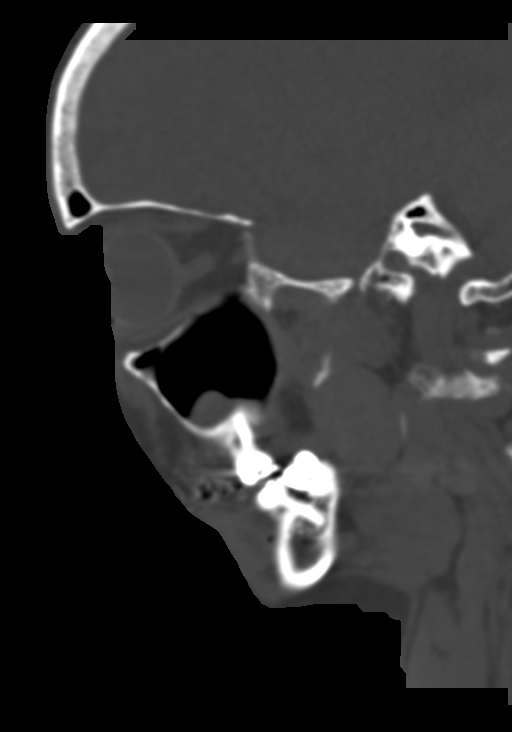
[im 44/87  bone]
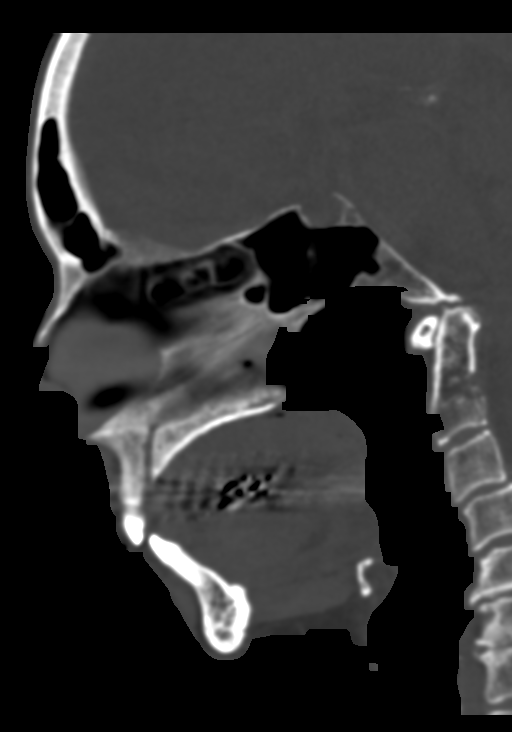
[im 58/87  bone]
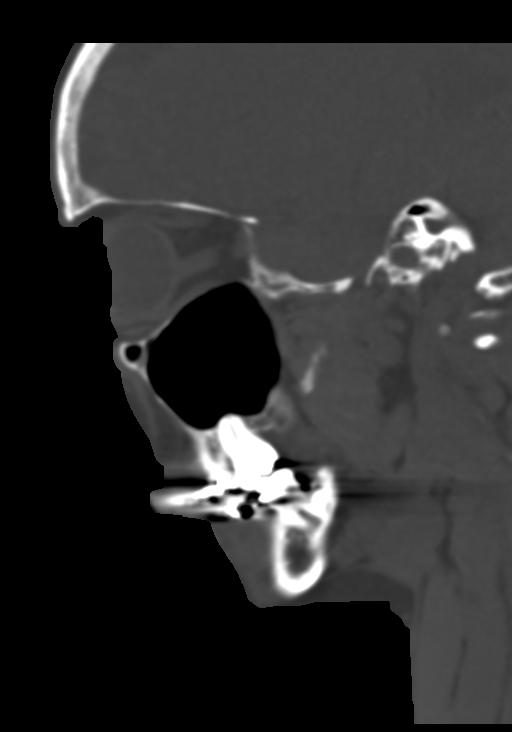

[15 of 47 positions shown; findings below may reference images not displayed]

FINDINGS: Osseous: No displaced fracture is seen.

Orbits: Optic globes are symmetrical. Retrobulbar soft tissues are
unremarkable.

Sinuses: There is mucous retention cyst in the left maxillary sinus.
There are no air-fluid levels.

Soft tissues: There is subcutaneous contusion/hematoma in the left
side of face. There are no opaque foreign bodies.

Limited intracranial: Unremarkable.

Degenerative changes are noted in the cervical spine more prominent
at C1-C2, C5-C6 and C6-C7 levels.
IMPRESSION: No recent fracture is seen in the facial bones. No focal abnormality
is seen in the orbits. There are no air-fluid levels in the
paranasal sinuses. There is mucous retention cyst in the left
maxillary sinus suggesting chronic sinusitis. Cervical spondylosis.

## 2023-01-12 IMAGING — MG MM DIGITAL SCREENING BILAT W/ TOMO AND CAD
8 series · 9 of 24 positions shown · non-contrast
Comparison: Previous exam(s).

CLINICAL DATA: Screening.

EXAM:
DIGITAL SCREENING BILATERAL MAMMOGRAM WITH TOMOSYNTHESIS AND CAD
TECHNIQUE: Bilateral screening digital craniocaudal and mediolateral oblique
mammograms were obtained. Bilateral screening digital breast
tomosynthesis was performed. The images were evaluated with
computer-aided detection.

[L MLO synth-2D]
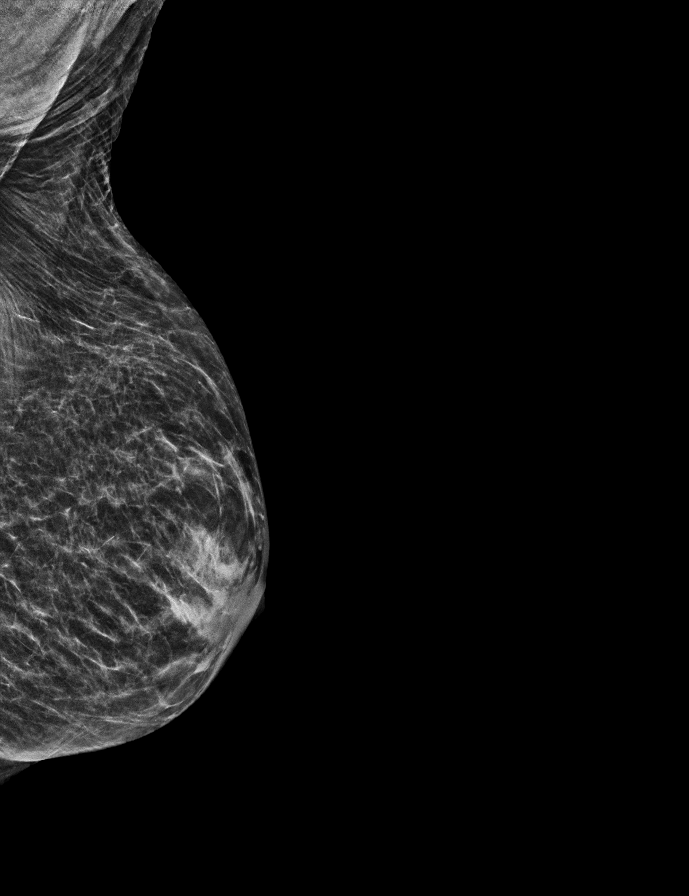

[L CC synth-2D]
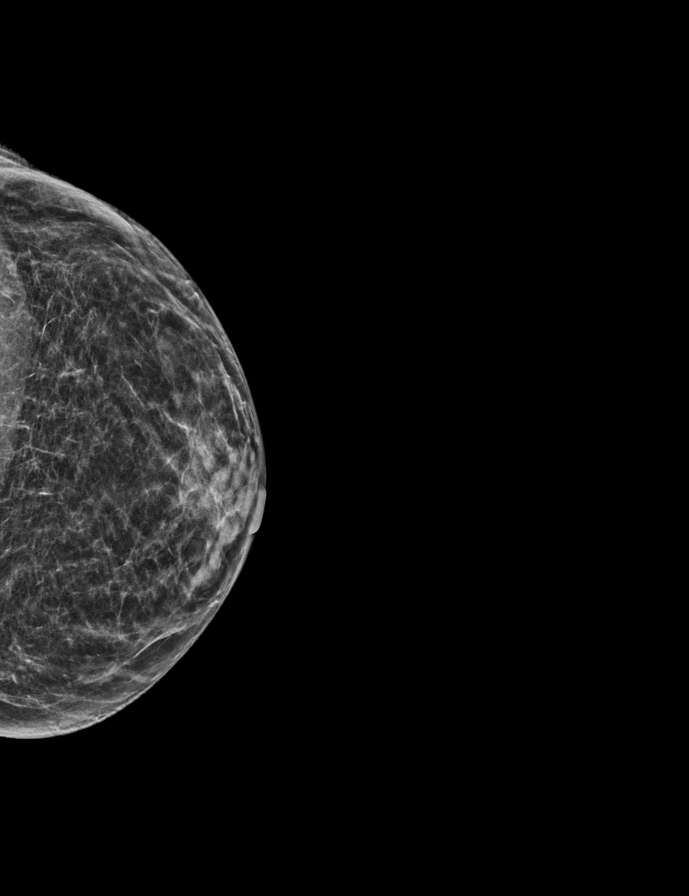

[R MLO synth-2D]
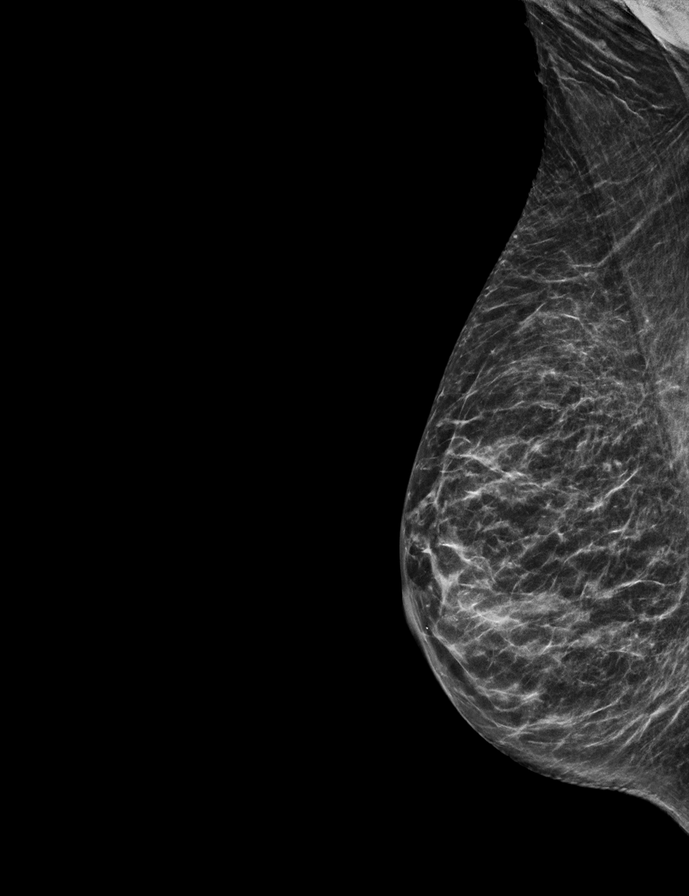

[R CC synth-2D]
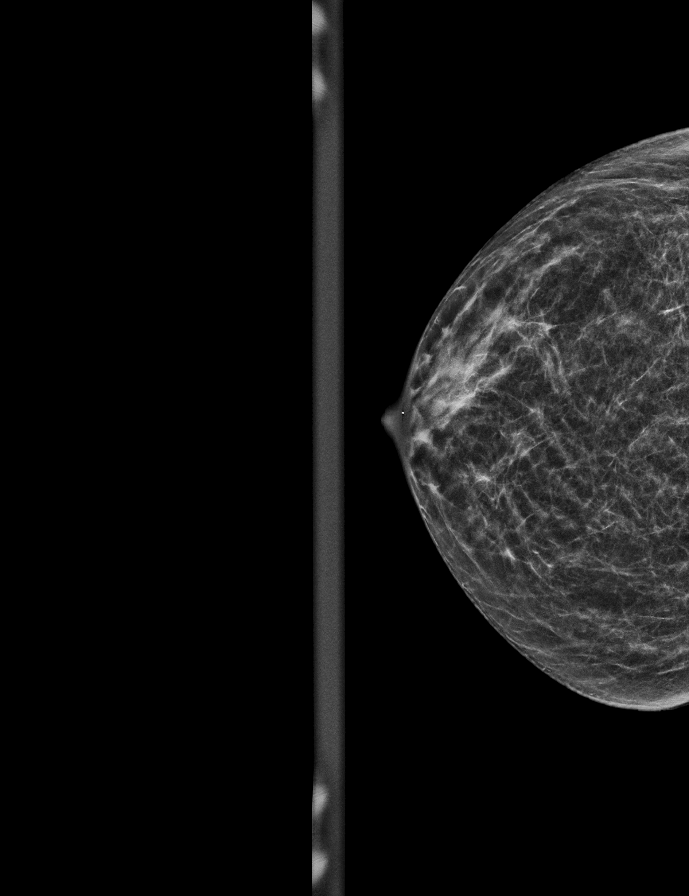

[R MLO tomo · 2 of 47 frames shown]
[frame 16/47]
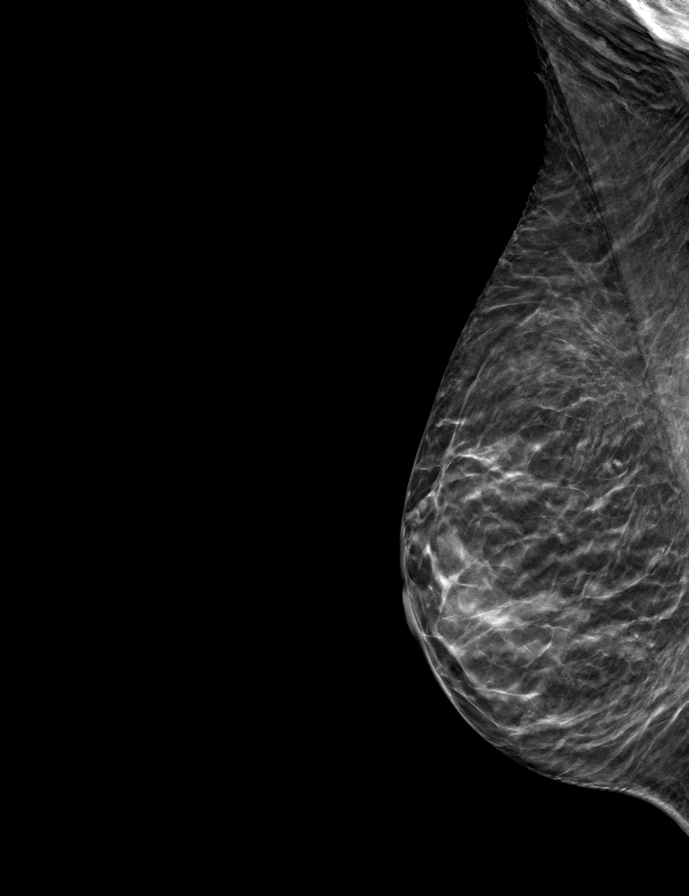
[frame 24/47]
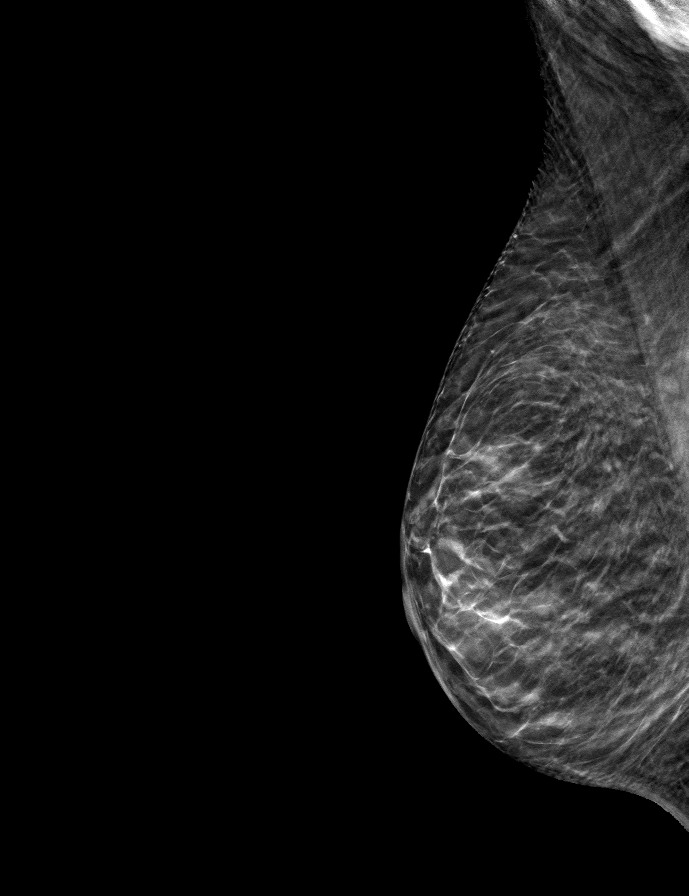

[L MLO tomo · tomo slice 21/40.0]
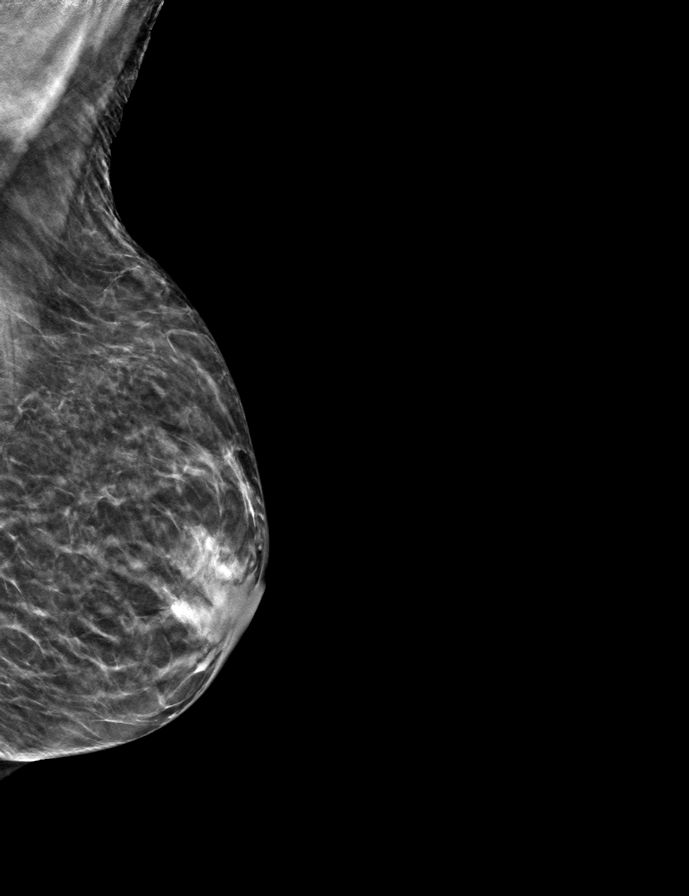

[R CC tomo · tomo slice 23/44.0]
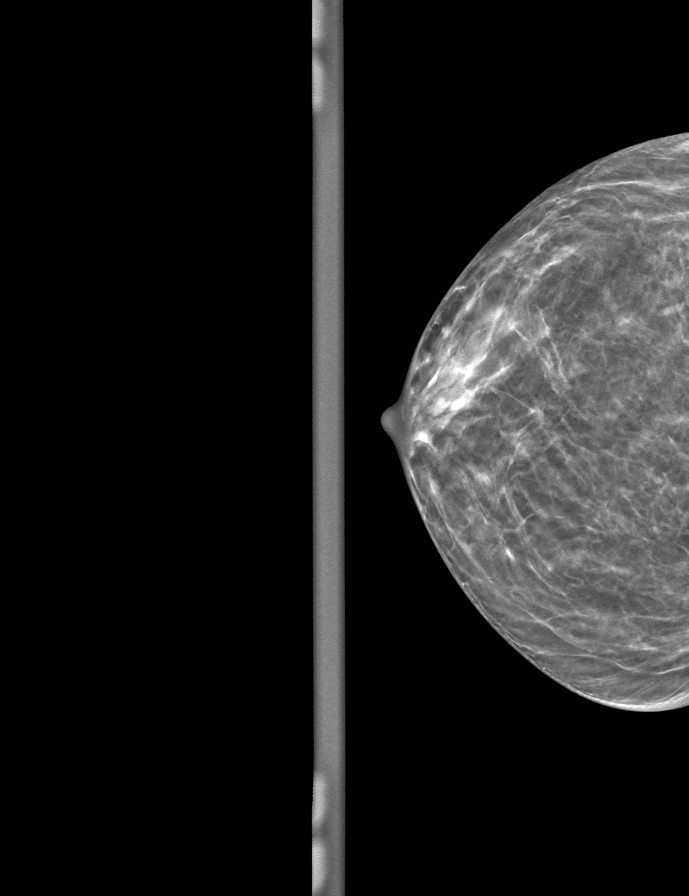

[L CC tomo · tomo slice 23/44.0]
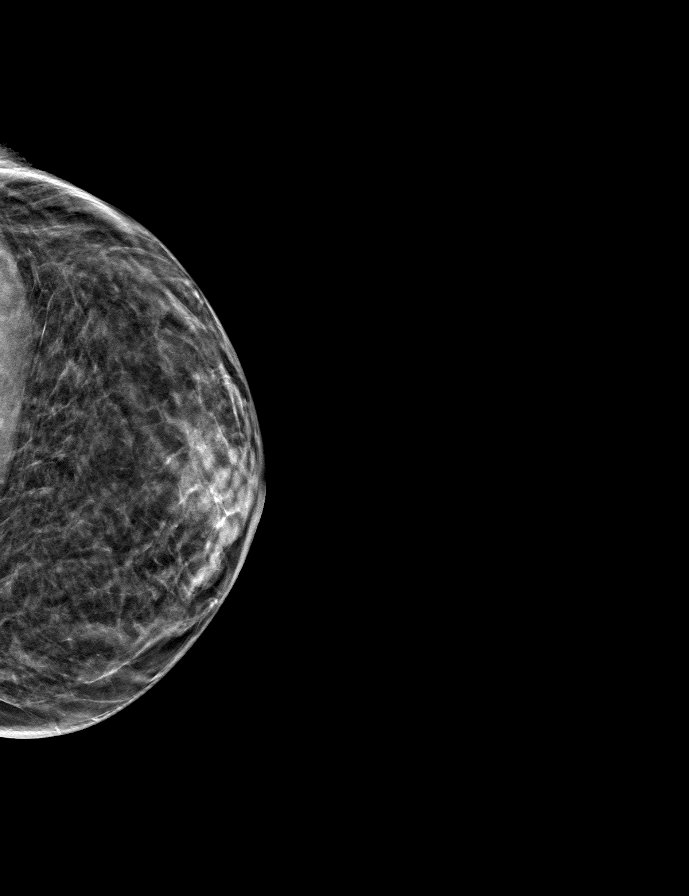

[9 of 24 positions shown; findings below may reference images not displayed]

ACR Breast Density Category b: There are scattered areas of
fibroglandular density.
FINDINGS: There are no findings suspicious for malignancy.
IMPRESSION: No mammographic evidence of malignancy. A result letter of this
screening mammogram will be mailed directly to the patient.

RECOMMENDATION:
Screening mammogram in one year. (Code:51-O-LD2)

BI-RADS CATEGORY  1: Negative.

## 2023-02-09 ENCOUNTER — Other Ambulatory Visit: Payer: Self-pay | Admitting: *Deleted

## 2023-02-09 DIAGNOSIS — I10 Essential (primary) hypertension: Secondary | ICD-10-CM

## 2023-02-09 MED ORDER — IRBESARTAN 300 MG PO TABS: 300.0000 mg | ORAL_TABLET | Freq: Every day | ORAL | 0 refills | Status: DC

## 2023-02-14 ENCOUNTER — Ambulatory Visit: Payer: Medicare Other | Admitting: Family Medicine

## 2023-02-14 ENCOUNTER — Encounter: Payer: Self-pay | Admitting: Family Medicine

## 2023-02-14 ENCOUNTER — Other Ambulatory Visit: Payer: Self-pay | Admitting: Family Medicine

## 2023-02-14 ENCOUNTER — Other Ambulatory Visit (INDEPENDENT_AMBULATORY_CARE_PROVIDER_SITE_OTHER): Payer: Medicare Other

## 2023-02-14 VITALS — BP 133/62 | HR 70 | Temp 98.4°F | Ht 63.0 in | Wt 101.8 lb

## 2023-02-14 DIAGNOSIS — Z0001 Encounter for general adult medical examination with abnormal findings: Secondary | ICD-10-CM

## 2023-02-14 DIAGNOSIS — Z780 Asymptomatic menopausal state: Secondary | ICD-10-CM

## 2023-02-14 DIAGNOSIS — R634 Abnormal weight loss: Secondary | ICD-10-CM

## 2023-02-14 DIAGNOSIS — I10 Essential (primary) hypertension: Secondary | ICD-10-CM | POA: Diagnosis not present

## 2023-02-14 DIAGNOSIS — M810 Age-related osteoporosis without current pathological fracture: Secondary | ICD-10-CM | POA: Diagnosis not present

## 2023-02-14 DIAGNOSIS — E782 Mixed hyperlipidemia: Secondary | ICD-10-CM | POA: Diagnosis not present

## 2023-02-14 DIAGNOSIS — E871 Hypo-osmolality and hyponatremia: Secondary | ICD-10-CM

## 2023-02-14 DIAGNOSIS — Z23 Encounter for immunization: Secondary | ICD-10-CM | POA: Diagnosis not present

## 2023-02-14 DIAGNOSIS — I480 Paroxysmal atrial fibrillation: Secondary | ICD-10-CM | POA: Diagnosis not present

## 2023-02-14 DIAGNOSIS — Z0000 Encounter for general adult medical examination without abnormal findings: Secondary | ICD-10-CM

## 2023-02-14 NOTE — Progress Notes (Signed)
Analyce [name] is a 77 y.o. female presents to office today for annual physical exam examination.    Concerns today include: 1.  Protein intake She has been trying to increase her vegetable protein.  She eats some chicken but admits that she does not intake quite as much as she should.  She has been actively working on increasing portion sizes but notes that she is always had a small appetite.  Her son is concerned that she is not getting enough protein and wants her to eat red meat but she does not like red meat.  She is gained a couple of pounds since her last visit she does feel better  Occupation: Retired, Marital status: Widowed, Substance use: None Health Maintenance Due  Topic Date Due   Zoster Vaccines- Shingrix (1 of 2) Never done   COVID-19 Vaccine (3 - Pfizer risk series) 01/18/2020   DEXA SCAN  05/08/2022   INFLUENZA VACCINE  11/25/2022   Refills needed today: None.  Immunization History  Administered Date(s) Administered   Fluad Quad(high Dose 65+) 02/05/2020, 04/10/2021, 02/10/2022   Influenza Split 06/13/2013   Influenza, High Dose Seasonal PF 06/06/2012   Influenza, Seasonal, Injecte, Preservative Fre 03/31/2015   Influenza,inj,Quad PF,6+ Mos 03/31/2015   Influenza,trivalent, recombinat, inj, PF 06/13/2013   PFIZER(Purple Top)SARS-COV-2 Vaccination 11/30/2019, 12/21/2019   Pneumococcal Conjugate-13 06/13/2013   Pneumococcal Polysaccharide-23 04/17/2010   Pneumococcal-Unspecified 04/17/2010   Td 04/27/2003   Td (Adult), 2 Lf Tetanus Toxid, Preservative Free 04/27/2003   Tdap 06/13/2013   Past Medical History:  Diagnosis Date   Age-related osteoporosis without current pathological fracture    Anemia    Anxiety    Arthritis    Cataract    Hypertension    Hyponatremia 08/20/2014   Related to HCTZ, advised by prior MD to stop the HCTZ, she is convinced that she needs it to control her BP Sodium as low as 127 on 06/21/14   Irregular heart beat    Social  History   Socioeconomic History   Marital status: Widowed    Spouse name: Not on file   Number of children: 3   Years of education: 12   Highest education level: Associate degree: occupational, Scientist, product/process development, or vocational program  Occupational History   Occupation: retired  Tobacco Use   Smoking status: Never   Smokeless tobacco: Never  Vaping Use   Vaping status: Never Used  Substance and Sexual Activity   Alcohol use: Yes    Alcohol/week: 7.0 standard drinks of alcohol    Types: 7 Glasses of wine per week   Drug use: No   Sexual activity: Not Currently  Other Topics Concern   Not on file  Social History Narrative   Lives alone. Two sons and a daughter.  Widow.     Social Determinants of Health   Financial Resource Strain: Low Risk  (11/18/2022)   Overall Financial Resource Strain (CARDIA)    Difficulty of Paying Living Expenses: Not hard at all  Food Insecurity: No Food Insecurity (11/18/2022)   Hunger Vital Sign    Worried About Running Out of Food in the Last Year: Never true    Ran Out of Food in the Last Year: Never true  Transportation Needs: No Transportation Needs (11/18/2022)   PRAPARE - Administrator, Civil Service (Medical): No    Lack of Transportation (Non-Medical): No  Physical Activity: Sufficiently Active (11/18/2022)   Exercise Vital Sign    Days of Exercise per Week:  7 days    Minutes of Exercise per Session: 50 min  Stress: No Stress Concern Present (11/18/2022)   [employer] of Occupational Health - Occupational Stress Questionnaire    Feeling of Stress : Only a little  Social Connections: Moderately Isolated (11/18/2022)   Social Connection and Isolation Panel [NHANES]    Frequency of Communication with Friends and Family: More than three times a week    Frequency of Social Gatherings with Friends and Family: Once a week    Attends Religious Services: More than 4 times per year    Active Member of [employer] or Organizations: No    Attends  Banker Meetings: Never    Marital Status: Widowed  Intimate Partner Violence: Not At Risk (05/28/2022)   Humiliation, Afraid, Rape, and Kick questionnaire    Fear of Current or Ex-Partner: No    Emotionally Abused: No    Physically Abused: No    Sexually Abused: No   Past Surgical History:  Procedure Laterality Date   ABDOMINAL HYSTERECTOMY     APPENDECTOMY     KNEE ARTHROSCOPY     TONSILLECTOMY     Family History  Problem Relation Age of Onset   Arthritis Mother    CAD Mother 53   Hypertension Mother    Hyperlipidemia Mother    Kidney disease Mother    Heart disease Mother    Hypertension Brother    Stroke Maternal Grandmother    Heart disease Maternal Grandfather    Arthritis Paternal Grandmother    Breast cancer Neg Hx     Current Outpatient Medications:    amLODipine (NORVASC) 5 MG tablet, TAKE 1 TABLET BY MOUTH DAILY, Disp: 100 tablet, Rfl: 2   aspirin EC 81 MG tablet, Take 1 tablet (81 mg total) by mouth daily. Swallow whole., Disp: 90 tablet, Rfl: 3   Cholecalciferol (VITAMIN D3) 125 MCG (5000 UT) CAPS, Take 5,000 Units by mouth., Disp: , Rfl:    ferrous sulfate 325 (65 FE) MG tablet, Take 325 mg by mouth daily with breakfast., Disp: , Rfl:    flecainide (TAMBOCOR) 100 MG tablet, TAKE 1 TABLET BY MOUTH TWICE  DAILY, Disp: 200 tablet, Rfl: 2   gabapentin (NEURONTIN) 100 MG capsule, Take 1 capsule (100 mg total) by mouth 2 (two) times daily as needed., Disp: 200 capsule, Rfl: 3   irbesartan (AVAPRO) 300 MG tablet, Take 1 tablet (300 mg total) by mouth daily., Disp: 90 tablet, Rfl: 0   metoprolol succinate (TOPROL-XL) 50 MG 24 hr tablet, TAKE 1 TABLET BY MOUTH DAILY, Disp: 100 tablet, Rfl: 2   Multiple Vitamins-Minerals (MULTIVITAMIN WITH MINERALS) tablet, Take 1 tablet by mouth daily., Disp: , Rfl:    pravastatin (PRAVACHOL) 20 MG tablet, Take 1 tablet (20 mg total) by mouth daily., Disp: 100 tablet, Rfl: 3  Allergies  Allergen Reactions   Fosamax  [Alendronate Sodium]     Dizziness, elevated blood pressure   Sertraline Nausea Only    Insomnia, did not feel well   Sulfa Antibiotics Nausea Only     ROS: Review of Systems A comprehensive review of systems was negative except for: Gastrointestinal: positive for constipation Musculoskeletal: positive for clavicle deformity    Physical exam BP 133/62   Pulse 70   Temp 98.4 F (36.9 C)   Ht 5\' 3"  (1.6 m)   Wt 101 lb 12.8 oz (46.2 kg)   SpO2 98%   BMI 18.03 kg/m  General appearance: alert, cooperative, appears stated age, and  no distress Head: Normocephalic, without obvious abnormality, atraumatic Eyes: negative findings: lids and lashes normal, conjunctivae and sclerae normal, corneas clear, and pupils equal, round, reactive to light and accomodation Ears: normal TM's and external ear canals both ears Nose: Nares normal. Septum midline. Mucosa normal. No drainage or sinus tenderness. Throat: lips, mucosa, and tongue normal; left front tooth with crack down the front. Otherwise, teeth and gums normal Neck: no adenopathy, no carotid bruit, supple, symmetrical, trachea midline, thyroid not enlarged, symmetric, no tenderness/mass/nodules, and prominent proximal clavicle on right Back:  Slight scoliotic curve noted Lungs: clear to auscultation bilaterally Heart: regular rate and rhythm, S1, S2 normal, no murmur, click, rub or gallop Abdomen: soft, non-tender; bowel sounds normal; no masses,  no organomegaly Extremities: extremities normal, atraumatic, no cyanosis or edema Pulses: 2+ and symmetric Skin: Skin color, texture, turgor normal. No rashes or lesions Lymph nodes: Cervical, supraclavicular, and axillary nodes normal. Neurologic: Grossly normal      02/14/2023    1:02 PM 11/19/2022    8:20 AM 05/28/2022    1:23 PM  Depression screen PHQ 2/9  Decreased Interest 0 0 0  Down, Depressed, Hopeless 0 0 0  PHQ - 2 Score 0 0 0  Altered sleeping 0 0   Tired, decreased energy 0 0    Change in appetite 0 0   Feeling bad or failure about yourself  0 0   Trouble concentrating 0 0   Moving slowly or fidgety/restless 0 0   Suicidal thoughts 0 0   PHQ-9 Score 0 0   Difficult doing work/chores Not difficult at all Not difficult at all       02/14/2023    1:02 PM 11/19/2022    8:20 AM 08/11/2021    9:52 AM 04/10/2021    1:34 PM  GAD 7 : Generalized Anxiety Score  Nervous, Anxious, on Edge 0 0 0 0  Control/stop worrying 0 0 0 0  Worry too much - different things 0 0 0 0  Trouble relaxing 0 2 0 0  Restless 0 0 0 0  Easily annoyed or irritable 0 0 0 0  Afraid - awful might happen 0 0 0 0  Total GAD 7 Score 0 2 0 0  Anxiety Difficulty Not difficult at all Not difficult at all Not difficult at all Not difficult at all     Assessment/ Plan: Venetia Night here for annual physical exam.   Annual physical exam  Paroxysmal atrial fibrillation (HCC)  Age-related osteoporosis without current pathological fracture - Plan: VITAMIN D 25 Hydroxy (Vit-D Deficiency, Fractures)  Essential hypertension with goal blood pressure less than 140/90 - Plan: Basic metabolic panel  Mixed hyperlipidemia - Plan: Lipid panel  Unexplained weight loss  Chronic hyponatremia - Plan: Basic metabolic panel  Rate and rhythm controlled today.  Future order for vitamin D given history of osteoporosis placed.  She will get DEXA scan done today  Blood pressure well-controlled.  Check sodium level along with fasting lipid.  She will schedule this  Weight loss has halted and she has gained a little bit since her last visit.  Encouraged increased volume of food and increased protein even if it is not animal protein  Counseled on healthy lifestyle choices, including diet (rich in fruits, vegetables and lean meats and low in salt and simple carbohydrates) and exercise (at least 30 minutes of moderate physical activity daily).  Patient to follow up 46m for repeat Shingrix and weight  check  Rico Massar M.  Nadine Counts, DO

## 2023-02-18 DIAGNOSIS — M810 Age-related osteoporosis without current pathological fracture: Secondary | ICD-10-CM | POA: Diagnosis not present

## 2023-02-18 DIAGNOSIS — Z780 Asymptomatic menopausal state: Secondary | ICD-10-CM | POA: Diagnosis not present

## 2023-02-21 ENCOUNTER — Other Ambulatory Visit: Payer: Medicare Other

## 2023-02-21 ENCOUNTER — Telehealth: Payer: Self-pay | Admitting: Family Medicine

## 2023-02-21 DIAGNOSIS — E871 Hypo-osmolality and hyponatremia: Secondary | ICD-10-CM

## 2023-02-21 DIAGNOSIS — M810 Age-related osteoporosis without current pathological fracture: Secondary | ICD-10-CM | POA: Diagnosis not present

## 2023-02-21 DIAGNOSIS — I10 Essential (primary) hypertension: Secondary | ICD-10-CM

## 2023-02-21 DIAGNOSIS — E782 Mixed hyperlipidemia: Secondary | ICD-10-CM | POA: Diagnosis not present

## 2023-02-21 NOTE — Telephone Encounter (Signed)
Unfortunately, this can occur. It typically subsides after 48 hours though. If she is still feeling flu like then I recommend she be checked out, as she may have come in contact with something viral before she got the shot that had not yet manifested.

## 2023-02-21 NOTE — Telephone Encounter (Signed)
Pt has been notified , pt states she got this on Monday of last week - she has been having a low grade fever states she has felt bad all week states she has been taking tylenol cold and flu OTC

## 2023-02-21 NOTE — Telephone Encounter (Signed)
Highly recommend she do an at home COVID test if negative. Recommend she be seen by DOD for triple swab/ exam

## 2023-02-22 ENCOUNTER — Telehealth: Payer: Self-pay | Admitting: Family Medicine

## 2023-02-22 LAB — VITAMIN D 25 HYDROXY (VIT D DEFICIENCY, FRACTURES): Vit D, 25-Hydroxy: 148 ng/mL — ABNORMAL HIGH (ref 30.0–100.0)

## 2023-02-22 LAB — LIPID PANEL
Chol/HDL Ratio: 1.8 ratio (ref 0.0–4.4)
Cholesterol, Total: 142 mg/dL (ref 100–199)
HDL: 78 mg/dL (ref >39)
LDL Chol Calc (NIH): 54 mg/dL (ref 0–99)
Triglycerides: 45 mg/dL (ref 0–149)
VLDL Cholesterol Cal: 10 mg/dL (ref 5–40)

## 2023-02-22 LAB — BASIC METABOLIC PANEL
BUN/Creatinine Ratio: 12 (ref 12–28)
BUN: 6 mg/dL — ABNORMAL LOW (ref 8–27)
CO2: 21 mmol/L (ref 20–29)
Calcium: 8.9 mg/dL (ref 8.7–10.3)
Chloride: 86 mmol/L — ABNORMAL LOW (ref 96–106)
Creatinine, Ser: 0.51 mg/dL — ABNORMAL LOW (ref 0.57–1.00)
Glucose: 95 mg/dL (ref 70–99)
Potassium: 4.3 mmol/L (ref 3.5–5.2)
Sodium: 129 mmol/L — ABNORMAL LOW (ref 134–144)
eGFR: 95 mL/min/{1.73_m2} (ref >59)

## 2023-02-22 NOTE — Telephone Encounter (Signed)
So again, the recommendation was that she come in for triple swab for flu testing.

## 2023-02-22 NOTE — Telephone Encounter (Signed)
Scheduled pt to be seen tomorrow and she is aware of appt.

## 2023-02-22 NOTE — Telephone Encounter (Signed)
Pt notified , going to take test and call back

## 2023-02-23 ENCOUNTER — Other Ambulatory Visit: Payer: Self-pay | Admitting: Family Medicine

## 2023-02-23 ENCOUNTER — Encounter: Payer: Self-pay | Admitting: Family Medicine

## 2023-02-23 ENCOUNTER — Ambulatory Visit: Payer: Medicare Other | Admitting: Family Medicine

## 2023-02-23 VITALS — BP 137/61 | HR 89 | Temp 100.5°F | Ht 63.0 in | Wt 103.1 lb

## 2023-02-23 DIAGNOSIS — E871 Hypo-osmolality and hyponatremia: Secondary | ICD-10-CM

## 2023-02-23 DIAGNOSIS — R509 Fever, unspecified: Secondary | ICD-10-CM | POA: Diagnosis not present

## 2023-02-23 DIAGNOSIS — I10 Essential (primary) hypertension: Secondary | ICD-10-CM

## 2023-02-23 NOTE — Progress Notes (Unsigned)
   Acute Office Visit  Subjective:     Patient ID: Mary [name], female    DOB: [birth year], 77 y.o.   MRN: [id number]  Chief Complaint  Patient presents with   Fever    Fever  This is a new problem. Episode onset: since 02/16/23. The problem has been gradually worsening. The maximum temperature noted was 100 to 100.9 F. Associated symptoms include ear pain, headaches, muscle aches and nausea. Pertinent negatives include no abdominal pain, chest pain, congestion, coughing, diarrhea, rash, sore throat, urinary pain, vomiting or wheezing. She has tried acetaminophen for the symptoms. The treatment provided mild relief.  Risk factors: no recent travel and no sick contacts   Had labs done 2 days ago. Has chronic hyponatremia. Tries to put a lot of salt on her food. Denies changes in vision, dizziness, confusion, tremor, changes in gait, cramping.   Review of Systems  Constitutional:  Positive for fever.  HENT:  Positive for ear pain. Negative for congestion and sore throat.   Respiratory:  Negative for cough and wheezing.   Cardiovascular:  Negative for chest pain.  Gastrointestinal:  Positive for nausea. Negative for abdominal pain, diarrhea and vomiting.  Genitourinary:  Negative for dysuria.  Skin:  Negative for rash.  Neurological:  Positive for headaches.        Objective:    BP 137/61   Pulse 89   Temp (!) 100.5 F (38.1 C) (Oral)   Ht 5\' 3"  (1.6 m)   Wt 103 lb 2 oz (46.8 kg)   SpO2 97%   BMI 18.27 kg/m  Wt Readings from Last 3 Encounters:  02/23/23 103 lb 2 oz (46.8 kg)  02/14/23 101 lb 12.8 oz (46.2 kg)  11/19/22 101 lb (45.8 kg)      Physical Exam  No results found for any visits on 02/23/23.      Assessment & Plan:   Problem List Items Addressed This Visit   None Visit Diagnoses     Fever in adult    -  Primary   Relevant Orders   COVID-19, Flu A+B and RSV       No orders of the defined types were placed in this encounter.   No follow-ups  on file.  Gabriel Earing, FNP

## 2023-02-24 ENCOUNTER — Encounter: Payer: Self-pay | Admitting: Family Medicine

## 2023-02-24 ENCOUNTER — Other Ambulatory Visit: Payer: Medicare Other

## 2023-02-24 DIAGNOSIS — R509 Fever, unspecified: Secondary | ICD-10-CM | POA: Diagnosis not present

## 2023-02-24 LAB — URINALYSIS, ROUTINE W REFLEX MICROSCOPIC
Bilirubin, UA: NEGATIVE
Glucose, UA: NEGATIVE
Leukocytes,UA: NEGATIVE
Nitrite, UA: NEGATIVE
Protein,UA: NEGATIVE
Specific Gravity, UA: 1.01 (ref 1.005–1.030)
Urobilinogen, Ur: 0.2 mg/dL (ref 0.2–1.0)
pH, UA: 6 (ref 5.0–7.5)

## 2023-02-24 LAB — MICROSCOPIC EXAMINATION
Renal Epithel, UA: NONE SEEN /[HPF]
Yeast, UA: NONE SEEN

## 2023-02-24 LAB — COVID-19, FLU A+B AND RSV
Influenza A, NAA: NOT DETECTED
Influenza B, NAA: NOT DETECTED
RSV, NAA: NOT DETECTED
SARS-CoV-2, NAA: NOT DETECTED

## 2023-02-24 MED ORDER — CEPHALEXIN 500 MG PO CAPS: 500.0000 mg | ORAL_CAPSULE | Freq: Two times a day (BID) | ORAL | 0 refills | Status: DC

## 2023-02-28 LAB — URINE CULTURE

## 2023-03-02 ENCOUNTER — Other Ambulatory Visit: Payer: Self-pay | Admitting: Family Medicine

## 2023-03-02 DIAGNOSIS — N3000 Acute cystitis without hematuria: Secondary | ICD-10-CM

## 2023-03-02 MED ORDER — CIPROFLOXACIN HCL 500 MG PO TABS: 500.0000 mg | ORAL_TABLET | Freq: Two times a day (BID) | ORAL | 0 refills | Status: DC

## 2023-03-07 ENCOUNTER — Telehealth: Payer: Self-pay | Admitting: Family Medicine

## 2023-03-07 NOTE — Telephone Encounter (Signed)
Spoke with patient, she finishes antibiotic today and is still not feeling better, still has urinary symptoms and sporadic fever.  Appointment scheduled tomorrow at 8:05 am with Dr Darlyn Read.

## 2023-03-07 NOTE — Telephone Encounter (Signed)
Copied from CRM [id number]. Topic: Clinical - Medication Question >> Mar 07, 2023  8:14 AM Larwance Sachs wrote: Reason for CRM: Patient requested nurse call back when available regarding the medication prescribed by doctor, patient stated she does not believe the medication is working. Call Back at [phone]

## 2023-03-08 ENCOUNTER — Encounter: Payer: Self-pay | Admitting: Family Medicine

## 2023-03-08 ENCOUNTER — Ambulatory Visit (INDEPENDENT_AMBULATORY_CARE_PROVIDER_SITE_OTHER): Payer: Medicare Other | Admitting: Family Medicine

## 2023-03-08 VITALS — BP 116/62 | HR 92 | Temp 98.3°F | Ht 63.0 in | Wt 97.2 lb

## 2023-03-08 DIAGNOSIS — E871 Hypo-osmolality and hyponatremia: Secondary | ICD-10-CM | POA: Diagnosis not present

## 2023-03-08 DIAGNOSIS — R5383 Other fatigue: Secondary | ICD-10-CM | POA: Diagnosis not present

## 2023-03-08 DIAGNOSIS — R300 Dysuria: Secondary | ICD-10-CM | POA: Diagnosis not present

## 2023-03-08 DIAGNOSIS — R6889 Other general symptoms and signs: Secondary | ICD-10-CM | POA: Diagnosis not present

## 2023-03-08 LAB — URINALYSIS, COMPLETE
Bilirubin, UA: NEGATIVE
Glucose, UA: NEGATIVE
Leukocytes,UA: NEGATIVE
Nitrite, UA: NEGATIVE
Specific Gravity, UA: >1.03 — ABNORMAL HIGH (ref 1.005–1.030)
Urobilinogen, Ur: 0.2 mg/dL (ref 0.2–1.0)
pH, UA: 7 (ref 5.0–7.5)

## 2023-03-08 LAB — MICROSCOPIC EXAMINATION
WBC, UA: >30 /[HPF] — AB (ref 0–5)
Yeast, UA: NONE SEEN

## 2023-03-08 MED ORDER — LEVOFLOXACIN 500 MG PO TABS: 500.0000 mg | ORAL_TABLET | Freq: Every day | ORAL | 0 refills | Status: DC

## 2023-03-08 MED ORDER — PREDNISONE 10 MG PO TABS: 10.0000 mg | ORAL_TABLET | Freq: Every day | ORAL | 1 refills | Status: DC

## 2023-03-08 NOTE — Progress Notes (Signed)
Subjective:  Patient ID: Mary [name], female    DOB: 11-11-1945  Age: 77 y.o. MRN: [id number]  CC: Urinary Tract Infection   HPI Mary [name] presents for sx onset 10/21. Hx of bladder infections frequently. Flu like sx. Decreased appetite. Doesn't feel like drinking either. Achy and weak. Once last week she was too weak to lift herself out of the bath tub. Losing weight. Down 7 lb in 2 weeks.      03/08/2023    8:04 AM 02/14/2023    1:02 PM 11/19/2022    8:20 AM  Depression screen PHQ 2/9  Decreased Interest 0 0 0  Down, Depressed, Hopeless 0 0 0  PHQ - 2 Score 0 0 0  Altered sleeping  0 0  Tired, decreased energy  0 0  Change in appetite  0 0  Feeling bad or failure about yourself   0 0  Trouble concentrating  0 0  Moving slowly or fidgety/restless  0 0  Suicidal thoughts  0 0  PHQ-9 Score  0 0  Difficult doing work/chores  Not difficult at all Not difficult at all    History Mary [name] has a past medical history of Age-related osteoporosis without current pathological fracture, Anemia, Anxiety, Arthritis, Cataract, Hypertension, Hyponatremia (08/20/2014), and Irregular heart beat.   She has a past surgical history that includes Abdominal hysterectomy; Knee arthroscopy; Tonsillectomy; and Appendectomy.   Her family history includes Arthritis in her mother and paternal grandmother; CAD (age of onset: 73) in her mother; Heart disease in her maternal grandfather and mother; Hyperlipidemia in her mother; Hypertension in her brother and mother; Kidney disease in her mother; Stroke in her maternal grandmother.She reports that she has never smoked. She has never used smokeless tobacco. She reports current alcohol use of about 7.0 standard drinks of alcohol per week. She reports that she does not use drugs.    ROS Review of Systems  Constitutional: Negative.   HENT: Negative.    Eyes:  Negative for visual disturbance.  Respiratory:  Negative for shortness of breath.    Cardiovascular:  Negative for chest pain.  Gastrointestinal:  Negative for abdominal pain.  Musculoskeletal:  Negative for arthralgias.    Objective:  BP 116/62   Pulse 92   Temp 98.3 F (36.8 C)   Ht 5\' 3"  (1.6 m)   Wt 97 lb 3.2 oz (44.1 kg)   SpO2 100%   BMI 17.22 kg/m   BP Readings from Last 3 Encounters:  03/08/23 116/62  02/23/23 137/61  02/14/23 133/62    Wt Readings from Last 3 Encounters:  03/08/23 97 lb 3.2 oz (44.1 kg)  02/23/23 103 lb 2 oz (46.8 kg)  02/14/23 101 lb 12.8 oz (46.2 kg)     Physical Exam Constitutional:      General: She is not in acute distress.    Appearance: She is well-developed.  Cardiovascular:     Rate and Rhythm: Normal rate and regular rhythm.  Pulmonary:     Breath sounds: Normal breath sounds.  Musculoskeletal:        General: Normal range of motion.  Skin:    General: Skin is warm and dry.  Neurological:     Mental Status: She is alert and oriented to person, place, and time.       Assessment & Plan:   Mary [name] was seen today for urinary tract infection.  Diagnoses and all orders for this visit:  Dysuria -     Urinalysis, Complete  Hyponatremia -  Cortisol-am, blood -     CBC with Differential/Platelet -     CMP14+EGFR -     TSH -     Osmolality  Fatigue, unspecified type -     Cortisol-am, blood -     CBC with Differential/Platelet -     CMP14+EGFR -     TSH -     Osmolality  Other orders -     predniSONE (DELTASONE) 10 MG tablet; Take 1 tablet (10 mg total) by mouth daily with breakfast. -     Microscopic Examination -     levofloxacin (LEVAQUIN) 500 MG tablet; Take 1 tablet (500 mg total) by mouth daily. For 10 days       I have discontinued Mary [name]'s cephALEXin and ciprofloxacin. I am also having her start on predniSONE and levofloxacin. Additionally, I am having her maintain her Vitamin D3, multivitamin with minerals, ferrous sulfate, aspirin EC, gabapentin, pravastatin, flecainide,  metoprolol succinate, amLODipine, and irbesartan.  Allergies as of 03/08/2023       Reactions   Fosamax [alendronate Sodium]    Dizziness, elevated blood pressure   Sertraline Nausea Only   Insomnia, did not feel well   Sulfa Antibiotics Nausea Only        Medication List        Accurate as of March 08, 2023  6:36 PM. If you have any questions, ask your nurse or doctor.          STOP taking these medications    cephALEXin 500 MG capsule Commonly known as: Keflex Stopped by: Mary [name]   ciprofloxacin 500 MG tablet Commonly known as: Cipro Stopped by: Mary [name]       TAKE these medications    amLODipine 5 MG tablet Commonly known as: NORVASC TAKE 1 TABLET BY MOUTH DAILY   aspirin EC 81 MG tablet Take 1 tablet (81 mg total) by mouth daily. Swallow whole.   ferrous sulfate 325 (65 FE) MG tablet Take 325 mg by mouth daily with breakfast.   flecainide 100 MG tablet Commonly known as: TAMBOCOR TAKE 1 TABLET BY MOUTH TWICE  DAILY   gabapentin 100 MG capsule Commonly known as: NEURONTIN Take 1 capsule (100 mg total) by mouth 2 (two) times daily as needed.   irbesartan 300 MG tablet Commonly known as: AVAPRO Take 1 tablet (300 mg total) by mouth daily.   levofloxacin 500 MG tablet Commonly known as: LEVAQUIN Take 1 tablet (500 mg total) by mouth daily. For 10 days Started by: Mary [name]   metoprolol succinate 50 MG 24 hr tablet Commonly known as: TOPROL-XL TAKE 1 TABLET BY MOUTH DAILY   multivitamin with minerals tablet Take 1 tablet by mouth daily.   pravastatin 20 MG tablet Commonly known as: PRAVACHOL Take 1 tablet (20 mg total) by mouth daily.   predniSONE 10 MG tablet Commonly known as: DELTASONE Take 1 tablet (10 mg total) by mouth daily with breakfast. Started by: Broadus John Whit Bruni   Vitamin D3 125 MCG (5000 UT) Caps Take 5,000 Units by mouth.         Follow-up: Return in about 2 weeks (around 03/22/2023), or if symptoms  worsen or fail to improve with PCP.  Mary [name], M.D.

## 2023-03-10 LAB — SPECIMEN STATUS REPORT

## 2023-03-11 LAB — IRON AND TIBC
Iron Saturation: 8 % — CL (ref 15–55)
Iron: 14 ug/dL — ABNORMAL LOW (ref 27–139)
Total Iron Binding Capacity: 175 ug/dL — ABNORMAL LOW (ref 250–450)
UIBC: 161 ug/dL (ref 118–369)

## 2023-03-11 LAB — SPECIMEN STATUS REPORT

## 2023-03-11 LAB — FERRITIN: Ferritin: 809 ng/mL — ABNORMAL HIGH (ref 15–150)

## 2023-03-11 LAB — TRANSFERRIN: Transferrin: 142 mg/dL — ABNORMAL LOW (ref 192–364)

## 2023-03-14 ENCOUNTER — Telehealth: Payer: Self-pay

## 2023-03-14 LAB — CBC WITH DIFFERENTIAL/PLATELET
Basophils Absolute: 0 10*3/uL (ref 0.0–0.2)
Basos: 0 %
EOS (ABSOLUTE): 0 10*3/uL (ref 0.0–0.4)
Eos: 0 %
Hematocrit: 30.8 % — ABNORMAL LOW (ref 34.0–46.6)
Hemoglobin: 10.1 g/dL — ABNORMAL LOW (ref 11.1–15.9)
Immature Grans (Abs): 0.1 10*3/uL (ref 0.0–0.1)
Immature Granulocytes: 1 %
Lymphocytes Absolute: 0.6 10*3/uL — ABNORMAL LOW (ref 0.7–3.1)
Lymphs: 4 %
MCH: 28.3 pg (ref 26.6–33.0)
MCHC: 32.8 g/dL (ref 31.5–35.7)
MCV: 86 fL (ref 79–97)
Monocytes Absolute: 1.3 10*3/uL — ABNORMAL HIGH (ref 0.1–0.9)
Monocytes: 9 %
Neutrophils Absolute: 12.7 10*3/uL — ABNORMAL HIGH (ref 1.4–7.0)
Neutrophils: 86 %
Platelets: 701 10*3/uL — ABNORMAL HIGH (ref 150–450)
RBC: 3.57 x10E6/uL — ABNORMAL LOW (ref 3.77–5.28)
RDW: 12.2 % (ref 11.7–15.4)
WBC: 14.8 10*3/uL — ABNORMAL HIGH (ref 3.4–10.8)

## 2023-03-14 LAB — CMP14+EGFR
ALT: 16 [IU]/L (ref 0–32)
AST: 22 [IU]/L (ref 0–40)
Albumin: 3.4 g/dL — ABNORMAL LOW (ref 3.8–4.8)
Alkaline Phosphatase: 107 [IU]/L (ref 44–121)
BUN/Creatinine Ratio: 12 (ref 12–28)
BUN: 6 mg/dL — ABNORMAL LOW (ref 8–27)
Bilirubin Total: 0.4 mg/dL (ref 0.0–1.2)
CO2: 28 mmol/L (ref 20–29)
Calcium: 8.9 mg/dL (ref 8.7–10.3)
Chloride: 81 mmol/L — ABNORMAL LOW (ref 96–106)
Creatinine, Ser: 0.49 mg/dL — ABNORMAL LOW (ref 0.57–1.00)
Globulin, Total: 2.5 g/dL (ref 1.5–4.5)
Glucose: 103 mg/dL — ABNORMAL HIGH (ref 70–99)
Potassium: 3.4 mmol/L — ABNORMAL LOW (ref 3.5–5.2)
Sodium: 127 mmol/L — ABNORMAL LOW (ref 134–144)
Total Protein: 5.9 g/dL — ABNORMAL LOW (ref 6.0–8.5)
eGFR: 96 mL/min/{1.73_m2} (ref >59)

## 2023-03-14 LAB — CORTISOL-AM, BLOOD: Cortisol - AM: 25.5 ug/dL — ABNORMAL HIGH (ref 6.2–19.4)

## 2023-03-14 LAB — TSH: TSH: 2.98 u[IU]/mL (ref 0.450–4.500)

## 2023-03-14 LAB — OSMOLALITY: Osmolality Meas: 251 mosm/kg — ABNORMAL LOW (ref 280–301)

## 2023-03-14 NOTE — Telephone Encounter (Signed)
Copied from CRM [id number]. Topic: Clinical - Lab/Test Results >> Mar 11, 2023  3:55 PM Roswell Nickel wrote: Reason for CRM: Patient is returning call regarding test result, can be reach at [phone]

## 2023-03-14 NOTE — Telephone Encounter (Signed)
Refer to lab results- patient has already been contacted.

## 2023-03-25 ENCOUNTER — Other Ambulatory Visit: Payer: Self-pay | Admitting: Family Medicine

## 2023-03-25 DIAGNOSIS — E782 Mixed hyperlipidemia: Secondary | ICD-10-CM

## 2023-03-30 ENCOUNTER — Encounter: Payer: Self-pay | Admitting: Nurse Practitioner

## 2023-03-30 ENCOUNTER — Ambulatory Visit: Payer: Medicare Other | Admitting: Nurse Practitioner

## 2023-03-30 VITALS — BP 155/71 | HR 73 | Temp 97.7°F | Ht 63.0 in | Wt 96.0 lb

## 2023-03-30 DIAGNOSIS — R300 Dysuria: Secondary | ICD-10-CM

## 2023-03-30 LAB — URINALYSIS, ROUTINE W REFLEX MICROSCOPIC
Bilirubin, UA: NEGATIVE
Glucose, UA: NEGATIVE
Ketones, UA: NEGATIVE
Leukocytes,UA: NEGATIVE
Nitrite, UA: NEGATIVE
Protein,UA: NEGATIVE
Specific Gravity, UA: 1.015 (ref 1.005–1.030)
Urobilinogen, Ur: 0.2 mg/dL (ref 0.2–1.0)
pH, UA: 6 (ref 5.0–7.5)

## 2023-03-30 LAB — MICROSCOPIC EXAMINATION
Bacteria, UA: NONE SEEN
Epithelial Cells (non renal): NONE SEEN /[HPF] (ref 0–10)
Renal Epithel, UA: NONE SEEN /[HPF]
Yeast, UA: NONE SEEN

## 2023-03-30 MED ORDER — PHENAZOPYRIDINE HCL 100 MG PO TABS: 100.0000 mg | ORAL_TABLET | Freq: Three times a day (TID) | ORAL | 0 refills | Status: DC | PRN

## 2023-03-30 NOTE — Progress Notes (Signed)
Acute Office Visit  Subjective:     Patient ID: Mary [name], female    DOB: 1945/12/20, 77 y.o.   MRN: [id number]  Chief Complaint  Patient presents with   Flank Pain    Left kidney pain and bladder pain, has been going on for a month. Would like referral to urologist     Flank Pain Pertinent negatives include no chest pain, fever or headaches.   Mary [name] is a 77 y.o. female who complains of urinary frequency, urgency and dysuria x 1 days, with flank pain, Denies fever, chills, or abnormal vaginal discharge or bleeding. She was just treated for UTI on 03/08/2023 with Levaquin ad symptoms came back last night Will not dispensed ATB today, will order culture and wait for results Active Ambulatory Problems    Diagnosis Date Noted   Anxiety 08/20/2014   Atrial fibrillation (HCC) 09/28/2016   Essential hypertension with goal blood pressure less than 140/90 11/11/2015   Age-related osteoporosis without current pathological fracture 06/18/2015   Hyperlipidemia 09/28/2016   Loss of hair 01/31/2019   Syncope and collapse 04/28/2021   Resolved Ambulatory Problems    Diagnosis Date Noted   Hyponatremia 08/20/2014   Abscess 11/15/2019   Past Medical History:  Diagnosis Date   Anemia    Arthritis    Cataract    Hypertension    Irregular heart beat     Review of Systems  Constitutional:  Negative for chills and fever.  Respiratory:  Negative for cough and shortness of breath.   Cardiovascular:  Negative for chest pain and leg swelling.  Gastrointestinal:  Negative for diarrhea, nausea and vomiting.  Genitourinary:  Positive for flank pain.  Musculoskeletal:  Negative for joint pain and myalgias.  Skin:  Negative for itching and rash.  Neurological:  Negative for dizziness and headaches.  Endo/Heme/Allergies:  Negative for environmental allergies and polydipsia. Does not bruise/bleed easily.   Negative unless indicated in HPI    Objective:    BP (!) 155/71    Pulse 73   Temp 97.7 F (36.5 C) (Temporal)   Ht 5\' 3"  (1.6 m)   Wt 96 lb (43.5 kg)   SpO2 100%   BMI 17.01 kg/m  BP Readings from Last 3 Encounters:  03/30/23 (!) 155/71  03/08/23 116/62  02/23/23 137/61   Wt Readings from Last 3 Encounters:  03/30/23 96 lb (43.5 kg)  03/08/23 97 lb 3.2 oz (44.1 kg)  02/23/23 103 lb 2 oz (46.8 kg)      Physical Exam Vitals and nursing note reviewed.  Constitutional:      General: She is not in acute distress.    Appearance: Normal appearance.  HENT:     Head: Normocephalic and atraumatic.  Eyes:     General: No scleral icterus.    Extraocular Movements: Extraocular movements intact.     Conjunctiva/sclera: Conjunctivae normal.     Pupils: Pupils are equal, round, and reactive to light.  Cardiovascular:     Rate and Rhythm: Normal rate and regular rhythm.  Pulmonary:     Effort: Pulmonary effort is normal.     Breath sounds: Normal breath sounds.  Abdominal:     General: There is no distension.     Tenderness: There is no abdominal tenderness. There is no right CVA tenderness, left CVA tenderness, guarding or rebound.  Musculoskeletal:        General: Normal range of motion.     Right lower leg: No edema.  Left lower leg: No edema.  Skin:    General: Skin is warm and dry.     Findings: No rash.  Neurological:     Mental Status: She is alert and oriented to person, place, and time. Mental status is at baseline.  Psychiatric:        Mood and Affect: Mood normal.        Behavior: Behavior normal.        Thought Content: Thought content normal.        Judgment: Judgment normal.    Urine dipstick shows Blo trace-lysed.   Micro exam: 0-5 WBC's per HPF and 3-10 RBC's per HPF.  Results for orders placed or performed in visit on 03/30/23  Microscopic Examination   Urine  Result Value Ref Range   WBC, UA 0-5 0 - 5 /hpf   RBC, Urine 3-10 (A) 0 - 2 /hpf   Epithelial Cells (non renal) None seen 0 - 10 /hpf   Renal Epithel, UA  None seen None seen /hpf   Bacteria, UA None seen None seen/Few   Yeast, UA None seen None seen  Urinalysis, Routine w reflex microscopic  Result Value Ref Range   Specific Gravity, UA 1.015 1.005 - 1.030   pH, UA 6.0 5.0 - 7.5   Color, UA Yellow Yellow   Appearance Ur Clear Clear   Leukocytes,UA Negative Negative   Protein,UA Negative Negative/Trace   Glucose, UA Negative Negative   Ketones, UA Negative Negative   RBC, UA Trace (A) Negative   Bilirubin, UA Negative Negative   Urobilinogen, Ur 0.2 0.2 - 1.0 mg/dL   Nitrite, UA Negative Negative   Microscopic Examination See below:         Assessment & Plan:  Dysuria -     Urinalysis, Routine w reflex microscopic -     Urine Culture -     Phenazopyridine HCl; Take 1 tablet (100 mg total) by mouth 3 (three) times daily as needed for pain.  Dispense: 10 tablet; Refill: 0  Other orders -     Microscopic Examination    [name] 78 year old Caucasian female, no acute distress Dysuria: Will order urine urine culture since client just  got done with a  14 days course of Levaquin will wait for culture to come back before prescribing any antibiotic.   Pyridium 1 tablet 3 times daily as needed as needed while waiting for culture result Increase hydration Usually call or return to clinic prn if these symptoms worsen or fail to improve as anticipated.  Return if symptoms worsen or fail to improve.  [name], [facility] [address] [place], [place] [address] [phone]  Note: This document was prepared by [employer] voice dictation technology and any errors that results from this process are unintentional.

## 2023-04-05 ENCOUNTER — Other Ambulatory Visit: Payer: Self-pay | Admitting: Nurse Practitioner

## 2023-04-05 DIAGNOSIS — R300 Dysuria: Secondary | ICD-10-CM

## 2023-04-05 DIAGNOSIS — N3000 Acute cystitis without hematuria: Secondary | ICD-10-CM

## 2023-04-05 LAB — URINE CULTURE

## 2023-04-05 MED ORDER — NITROFURANTOIN MONOHYD MACRO 100 MG PO CAPS: 100.0000 mg | ORAL_CAPSULE | Freq: Two times a day (BID) | ORAL | 0 refills | Status: AC

## 2023-04-06 ENCOUNTER — Other Ambulatory Visit: Payer: Self-pay | Admitting: Family Medicine

## 2023-04-06 ENCOUNTER — Telehealth: Payer: Self-pay | Admitting: Family Medicine

## 2023-04-06 DIAGNOSIS — N390 Urinary tract infection, site not specified: Secondary | ICD-10-CM

## 2023-04-06 DIAGNOSIS — I10 Essential (primary) hypertension: Secondary | ICD-10-CM

## 2023-04-06 NOTE — Telephone Encounter (Signed)
done

## 2023-04-06 NOTE — Telephone Encounter (Signed)
Copied from CRM [id number]. Topic: Clinical - Lab/Test Results >> Apr 06, 2023  9:16 AM Orinda Kenner C wrote: Reason for CRM: Pt is rtc nurse call about test results and antibiotics. Pt c/o ongoing UTI end of October beginning Nov and is having a lot of issues, losing weight. Pt wants to see an urologist- [facility] needs a referral. Pt does not want to do another round of antibiotics and wants to get the bottom of this issues. Pls c/b [phone]

## 2023-04-06 NOTE — Addendum Note (Signed)
Addended by: Hessie Diener on: 04/06/2023 10:05 AM   Modules accepted: Orders

## 2023-04-06 NOTE — Telephone Encounter (Signed)
I have the referral pending if ok to place please sign and I will let pt know.

## 2023-04-06 NOTE — Telephone Encounter (Signed)
Pt aware referral placed

## 2023-04-06 NOTE — Addendum Note (Signed)
Addended by: Raliegh Ip on: 04/06/2023 10:12 AM   Modules accepted: Orders

## 2023-04-12 ENCOUNTER — Telehealth: Payer: Self-pay | Admitting: Family Medicine

## 2023-04-12 NOTE — Telephone Encounter (Signed)
I'd like her seen by GI to further explore the unexplained weight loss. Imaging has been unrevealing so far.  Is the "fourth antibiotic" for urinary tract infection?  If so will refer to urology as well.

## 2023-04-12 NOTE — Telephone Encounter (Signed)
Copied from CRM [id number]. Topic: Clinical - Medical Advice >> Apr 12, 2023  8:43 AM Orinda Kenner C wrote: Reason for CRM: Pt is not getting well and has lost 10 lbs, feeling weak but pt has been eating, pt feels like her food is not assimalating well, food is not being use by her body. Pt is on her fourth antibiotics. Pt feels scared about her health. Pls advise and c/b [phone].

## 2023-04-13 ENCOUNTER — Ambulatory Visit: Payer: Medicare Other | Admitting: Cardiology

## 2023-04-14 ENCOUNTER — Telehealth: Payer: Self-pay | Admitting: Family Medicine

## 2023-04-14 DIAGNOSIS — N3021 Other chronic cystitis with hematuria: Secondary | ICD-10-CM | POA: Diagnosis not present

## 2023-04-14 NOTE — Telephone Encounter (Signed)
Copied from CRM [id number]. Topic: General - Other >> Apr 14, 2023 11:35 AM Mary [name] wrote: Reason for CRM: returning call, patient went to urologist this morning and everything is good.  Just need to complete antibiotic. She states she does not need appointment because she is feeling better.  Please [phone] if any further questions.

## 2023-04-14 NOTE — Telephone Encounter (Signed)
Lmtcb.

## 2023-04-14 NOTE — Telephone Encounter (Signed)
Patient called back went to urology this morning and was better.

## 2023-04-30 NOTE — Progress Notes (Signed)
  Cardiology Office Note:   Date:  05/03/2023  ID:  Mary [name], DOB 1945-07-19, MRN [id number] PCP: Jolinda Norene HERO, DO  [facility] Providers Cardiologist:  None {  History of Present Illness:   Mary [name] is a 78 y.o. female who presents for evaluation of syncope.    She has a history of atrial fib.  This has been paroxysmal and is treated with flecainide .   She had a monitor and had no arrhythmias.  I was going to order a POET (Plain Old Exercise Treadmill).  However, she could not do this because of orthopedic issues.   She has had no further syncopal episodes.  The last one was a couple years ago. The patient denies any new symptoms such as chest discomfort, neck or arm discomfort. There has been no new shortness of breath, PND or orthopnea. There have been no reported palpitations, presyncope or syncope.   ROS: As stated in the HPI and negative for all other systems.  Studies Reviewed:    EKG:   EKG Interpretation Date/Time:  Tuesday May 03 2023 15:50:19 EST Ventricular Rate:  71 PR Interval:    QRS Duration:  92 QT Interval:  402 QTC Calculation: 436 R Axis:   124  Text Interpretation: Normal sinus rhythm first degree AV block Right axis deviation Nonspecific ST abnormality compared to previous the PR interval is prolonged Confirmed by Lavona Agent ([id number]) on 05/03/2023 4:21:24 PM    Risk Assessment/Calculations:              Physical Exam:   VS:  BP 122/80   Pulse 71   Ht 5' 3.51 (1.613 m)   Wt 99 lb (44.9 kg)   BMI 17.26 kg/m    Wt Readings from Last 3 Encounters:  05/03/23 99 lb (44.9 kg)  03/30/23 96 lb (43.5 kg)  03/08/23 97 lb 3.2 oz (44.1 kg)     GEN: Well nourished, well developed in no acute distress NECK: No JVD; No carotid bruits CARDIAC: RRR, no murmurs, rubs, gallops RESPIRATORY:  Clear to auscultation without rales, wheezing or rhonchi  ABDOMEN: Soft, non-tender, non-distended EXTREMITIES:  No edema; No deformity    ASSESSMENT AND PLAN:   PAF:    She is having no symptomatic paroxysms on the flecainide .  She has an Scientist, Physiological that would alert us  potentially to fibrillation.     HTN:    Her blood pressure is at target.  No change in therapy.   SYNCOPE:   She has had no further syncopal episodes.  No change in therapy.  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  She is tolerating the meds as listed.  She will let me know if she ever has any symptoms that might be consistent with bradycardia arrhythmia.  Follow up with me in one year.   Signed, Agent Lavona, MD

## 2023-05-03 ENCOUNTER — Ambulatory Visit: Payer: Medicare Other | Admitting: Cardiology

## 2023-05-03 ENCOUNTER — Encounter: Payer: Self-pay | Admitting: Cardiology

## 2023-05-03 VITALS — BP 122/80 | HR 71 | Ht 63.51 in | Wt 99.0 lb

## 2023-05-03 DIAGNOSIS — I10 Essential (primary) hypertension: Secondary | ICD-10-CM

## 2023-05-03 DIAGNOSIS — I480 Paroxysmal atrial fibrillation: Secondary | ICD-10-CM | POA: Diagnosis not present

## 2023-05-03 NOTE — Patient Instructions (Signed)
 Medication Instructions:  The current medical regimen is effective;  continue present plan and medications.  *If you need a refill on your cardiac medications before your next appointment, please call your pharmacy*  Follow-Up: At [facility], you and your health needs are our priority.  As part of our continuing mission to provide you with exceptional heart care, we have created designated Provider Care Teams.  These Care Teams include your primary Cardiologist (physician) and Advanced Practice Providers (APPs -  Physician Assistants and Nurse Practitioners) who all work together to provide you with the care you need, when you need it.  We recommend signing up for the patient portal called MyChart.  Sign up information is provided on this After Visit Summary.  MyChart is used to connect with patients for Virtual Visits (Telemedicine).  Patients are able to view lab/test results, encounter notes, upcoming appointments, etc.  Non-urgent messages can be sent to your provider as well.   To learn more about what you can do with MyChart, go to [email or web address].    Your next appointment:   1 year(s)  Provider:   Dr Lynwood Schilling

## 2023-05-05 ENCOUNTER — Telehealth: Payer: Self-pay

## 2023-05-05 ENCOUNTER — Ambulatory Visit (INDEPENDENT_AMBULATORY_CARE_PROVIDER_SITE_OTHER): Payer: Medicare Other | Admitting: Family Medicine

## 2023-05-05 VITALS — BP 152/67 | HR 71 | Temp 97.9°F | Ht 63.5 in | Wt 98.6 lb

## 2023-05-05 DIAGNOSIS — I10 Essential (primary) hypertension: Secondary | ICD-10-CM

## 2023-05-05 DIAGNOSIS — R300 Dysuria: Secondary | ICD-10-CM

## 2023-05-05 LAB — MICROSCOPIC EXAMINATION
Bacteria, UA: NONE SEEN
Renal Epithel, UA: NONE SEEN /[HPF]
Yeast, UA: NONE SEEN

## 2023-05-05 LAB — URINALYSIS, ROUTINE W REFLEX MICROSCOPIC
Bilirubin, UA: NEGATIVE
Glucose, UA: NEGATIVE
Ketones, UA: NEGATIVE
Nitrite, UA: NEGATIVE
Protein,UA: NEGATIVE
Specific Gravity, UA: 1.01 (ref 1.005–1.030)
Urobilinogen, Ur: 0.2 mg/dL (ref 0.2–1.0)
pH, UA: 5.5 (ref 5.0–7.5)

## 2023-05-05 NOTE — Progress Notes (Signed)
 Acute Office Visit  Subjective:     Patient ID: Mary [name], female    DOB: 1945-07-28, 78 y.o.   MRN: [id number]  Chief Complaint  Patient presents with   Dysuria    Dysuria  This is a new problem. Episode onset: 2 days. The problem has been unchanged. The quality of the pain is described as aching. There has been no fever. Associated symptoms include frequency and urgency. Pertinent negatives include no chills, discharge, flank pain, hematuria, nausea, possible pregnancy or vomiting. She has tried increased fluids (cranberry, women's health probiotic) for the symptoms. The treatment provided no relief. Her past medical history is significant for recurrent UTIs.   Has appt with urology on 05/11/22 for follow up. Had positive urine culture on 03/30/23 and 02/24/23.  Review of Systems  Constitutional:  Negative for chills.  Gastrointestinal:  Negative for nausea and vomiting.  Genitourinary:  Positive for dysuria, frequency and urgency. Negative for flank pain and hematuria.        Objective:    BP (!) 152/67   Pulse 71   Temp 97.9 F (36.6 C) (Temporal)   Ht 5' 3.5 (1.613 m)   Wt 98 lb 9.6 oz (44.7 kg)   SpO2 100%   BMI 17.19 kg/m  BP Readings from Last 3 Encounters:  05/05/23 (!) 152/67  05/03/23 122/80  03/30/23 (!) 155/71      Physical Exam Vitals and nursing note reviewed.  Constitutional:      General: She is not in acute distress.    Appearance: Normal appearance. She is not ill-appearing, toxic-appearing or diaphoretic.  HENT:     Mouth/Throat:     Mouth: Mucous membranes are moist.     Pharynx: Oropharynx is clear.  Cardiovascular:     Rate and Rhythm: Normal rate and regular rhythm.     Heart sounds: Normal heart sounds. No murmur heard. Pulmonary:     Effort: Pulmonary effort is normal. No respiratory distress.     Breath sounds: Normal breath sounds.  Abdominal:     General: There is no distension.     Palpations: Abdomen is soft.      Tenderness: There is abdominal tenderness in the suprapubic area. There is no right CVA tenderness, left CVA tenderness, guarding or rebound.  Musculoskeletal:     Right lower leg: No edema.     Left lower leg: No edema.  Skin:    General: Skin is warm and dry.  Neurological:     General: No focal deficit present.     Mental Status: She is alert and oriented to person, place, and time.  Psychiatric:        Mood and Affect: Mood normal.        Behavior: Behavior normal.     Urine dipstick shows positive for RBC's and positive for leukocytes.  Micro exam: 0-5 WBC's per HPF, 0-2 RBC's per HPF, and no bacteria.       Assessment & Plan:   [name] was seen today for dysuria.  Diagnoses and all orders for this visit:  Dysuria UA negative today. Culture pending. Discussed to notify office if symptoms worsen while awaiting culture results and will treat empirically if symptoms worsen. Keep follow up with urology.  -     Urinalysis, Routine w reflex microscopic -     Urine Culture -     Microscopic Examination  Essential hypertension with goal blood pressure less than 140/90 BP not at goal today. BP was 122/80  at cardiology visit 2 days ago. Monitor BP at home and notify for elevated readings.   Return to office for new or worsening symptoms, or if symptoms persist.   The patient indicates understanding of these issues and agrees with the plan.  [name], FNP

## 2023-05-05 NOTE — Telephone Encounter (Signed)
 Copied from CRM [id number]. Topic: Clinical - Request for Lab/Test Order >> May 05, 2023 11:16 AM Elle L wrote: Reason for CRM: The patient is requesting to see if Dr. Jolinda could put in an order for a urinalysis and she will bring in a sample. She states that she has a cup available. The patient's call back number is [phone].

## 2023-05-09 ENCOUNTER — Other Ambulatory Visit: Payer: Self-pay | Admitting: Family Medicine

## 2023-05-09 DIAGNOSIS — N3000 Acute cystitis without hematuria: Secondary | ICD-10-CM

## 2023-05-09 MED ORDER — CIPROFLOXACIN HCL 500 MG PO TABS: 500.0000 mg | ORAL_TABLET | Freq: Two times a day (BID) | ORAL | 0 refills | Status: AC

## 2023-05-11 LAB — URINE CULTURE

## 2023-05-12 DIAGNOSIS — N3021 Other chronic cystitis with hematuria: Secondary | ICD-10-CM | POA: Diagnosis not present

## 2023-05-12 DIAGNOSIS — R3982 Chronic bladder pain: Secondary | ICD-10-CM | POA: Diagnosis not present

## 2023-05-12 DIAGNOSIS — R311 Benign essential microscopic hematuria: Secondary | ICD-10-CM | POA: Diagnosis not present

## 2023-05-25 DIAGNOSIS — R311 Benign essential microscopic hematuria: Secondary | ICD-10-CM | POA: Diagnosis not present

## 2023-05-30 ENCOUNTER — Other Ambulatory Visit: Payer: Self-pay | Admitting: Cardiology

## 2023-06-01 ENCOUNTER — Other Ambulatory Visit: Payer: Self-pay | Admitting: Family Medicine

## 2023-06-01 ENCOUNTER — Ambulatory Visit: Payer: Medicare Other

## 2023-06-01 VITALS — Ht 63.5 in | Wt 98.0 lb

## 2023-06-01 DIAGNOSIS — Z0000 Encounter for general adult medical examination without abnormal findings: Secondary | ICD-10-CM

## 2023-06-01 DIAGNOSIS — E782 Mixed hyperlipidemia: Secondary | ICD-10-CM

## 2023-06-01 DIAGNOSIS — R311 Benign essential microscopic hematuria: Secondary | ICD-10-CM | POA: Diagnosis not present

## 2023-06-01 DIAGNOSIS — R3129 Other microscopic hematuria: Secondary | ICD-10-CM | POA: Diagnosis not present

## 2023-06-01 NOTE — Progress Notes (Signed)
 Subjective:   Mary [name] is a 78 y.o. female who presents for Medicare Annual (Subsequent) preventive examination.  Visit Complete: Virtual I connected with  Glendale Solders on 06/01/23 by a audio enabled telemedicine application and verified that I am speaking with the correct person using two identifiers.  Patient Location: Home  Provider Location: Home Office  This patient declined Interactive audio and video telecommunications. Therefore the visit was completed with audio only.  I discussed the limitations of evaluation and management by telemedicine. The patient expressed understanding and agreed to proceed.  Vital Signs: Because this visit was a virtual/telehealth visit, some criteria may be missing or patient reported. Any vitals not documented were not able to be obtained and vitals that have been documented are patient reported.  Patient Medicare AWV questionnaire was completed by the patient on 05/31/23; I have confirmed that all information answered by patient is correct and no changes since this date.  Cardiac Risk Factors include: advanced age (>66men, >47 women);hypertension     Objective:    Today's Vitals   06/01/23 0801  Weight: 98 lb (44.5 kg)  Height: 5' 3.5 (1.613 m)   Body mass index is 17.09 kg/m.     06/01/2023    8:37 AM 05/28/2022    1:25 PM 05/27/2021    2:49 PM 05/26/2020    2:56 PM 07/08/2019   11:34 AM 01/22/2019    2:26 PM 01/20/2018    3:08 PM  Advanced Directives  Does Patient Have a Medical Advance Directive? No Yes Yes No No Yes Yes  Type of Furniture Conservator/restorer;Living will Healthcare Power of [place];Living will   Healthcare Power of [place];Living will Living will;Healthcare Power of Attorney  Does patient want to make changes to medical advance directive?  No - Patient declined    No - Patient declined No - Patient declined  Copy of Healthcare Power of Attorney in Chart?  Yes - validated most recent copy  scanned in chart (See row information) No - copy requested   No - copy requested No - copy requested  Would patient like information on creating a medical advance directive? Yes (MAU/Ambulatory/Procedural Areas - Information given)   No - Patient declined No - Patient declined      Current Medications (verified) Outpatient Encounter Medications as of 06/01/2023  Medication Sig   amLODipine  (NORVASC ) 5 MG tablet TAKE 1 TABLET BY MOUTH DAILY   aspirin  EC 81 MG tablet Take 1 tablet (81 mg total) by mouth daily. Swallow whole.   Cholecalciferol (VITAMIN D3) 125 MCG (5000 UT) CAPS Take 5,000 Units by mouth.   ferrous sulfate 325 (65 FE) MG tablet Take 325 mg by mouth daily with breakfast.   flecainide  (TAMBOCOR ) 100 MG tablet TAKE 1 TABLET BY MOUTH TWICE  DAILY   gabapentin  (NEURONTIN ) 100 MG capsule Take 1 capsule (100 mg total) by mouth 2 (two) times daily as needed.   irbesartan  (AVAPRO ) 300 MG tablet TAKE 1 TABLET BY MOUTH DAILY   metoprolol  succinate (TOPROL -XL) 50 MG 24 hr tablet TAKE 1 TABLET BY MOUTH DAILY   Multiple Vitamins-Minerals (MULTIVITAMIN WITH MINERALS) tablet Take 1 tablet by mouth daily.   pravastatin  (PRAVACHOL ) 20 MG tablet TAKE 1 TABLET BY MOUTH DAILY   No facility-administered encounter medications on file as of 06/01/2023.    Allergies (verified) Fosamax  [alendronate  sodium], Sertraline, and Sulfa antibiotics   History: Past Medical History:  Diagnosis Date   Age-related osteoporosis without current pathological fracture  Anemia    Anxiety    Arthritis    Cataract    Hypertension    Hyponatremia 08/20/2014   Related to HCTZ, advised by prior MD to stop the HCTZ, she is convinced that she needs it to control her BP Sodium as low as 127 on 06/21/14   Irregular heart beat    Past Surgical History:  Procedure Laterality Date   ABDOMINAL HYSTERECTOMY     APPENDECTOMY     KNEE ARTHROSCOPY     TONSILLECTOMY     Family History  Problem Relation Age of Onset    Arthritis Mother    CAD Mother 37   Hypertension Mother    Hyperlipidemia Mother    Kidney disease Mother    Heart disease Mother    Hypertension Brother    Stroke Maternal Grandmother    Heart disease Maternal Grandfather    Arthritis Paternal Grandmother    Breast cancer Neg Hx    Social History   Socioeconomic History   Marital status: Widowed    Spouse name: Not on file   Number of children: 3   Years of education: 33   Highest education level: 12th grade  Occupational History   Occupation: retired  Tobacco Use   Smoking status: Never   Smokeless tobacco: Never  Vaping Use   Vaping status: Never Used  Substance and Sexual Activity   Alcohol use: Yes    Alcohol/week: 7.0 standard drinks of alcohol    Types: 7 Glasses of wine per week   Drug use: No   Sexual activity: Not Currently  Other Topics Concern   Not on file  Social History Narrative   Lives alone. Two sons and a daughter.  Widow.     Social Drivers of Corporate Investment Banker Strain: Low Risk  (06/01/2023)   Overall Financial Resource Strain (CARDIA)    Difficulty of Paying Living Expenses: Not hard at all  Food Insecurity: No Food Insecurity (06/01/2023)   Hunger Vital Sign    Worried About Running Out of Food in the Last Year: Never true    Ran Out of Food in the Last Year: Never true  Transportation Needs: No Transportation Needs (06/01/2023)   PRAPARE - Administrator, Civil Service (Medical): No    Lack of Transportation (Non-Medical): No  Physical Activity: Sufficiently Active (06/01/2023)   Exercise Vital Sign    Days of Exercise per Week: 5 days    Minutes of Exercise per Session: 30 min  Stress: No Stress Concern Present (06/01/2023)   [employer] of Occupational Health - Occupational Stress Questionnaire    Feeling of Stress : Not at all  Social Connections: Moderately Integrated (06/01/2023)   Social Connection and Isolation Panel [NHANES]    Frequency of Communication with  Friends and Family: More than three times a week    Frequency of Social Gatherings with Friends and Family: Three times a week    Attends Religious Services: More than 4 times per year    Active Member of Clubs or Organizations: Yes    Attends Banker Meetings: More than 4 times per year    Marital Status: Widowed    Tobacco Counseling Counseling given: Not Answered   Clinical Intake:  Pre-visit preparation completed: Yes  Pain : No/denies pain     Diabetes: No  How often do you need to have someone help you when you read instructions, pamphlets, or other written materials from your doctor  or pharmacy?: 1 - Never  Interpreter Needed?: No  Information entered by :: Charmaine Bloodgood LPN   Activities of Daily Living    05/31/2023   11:37 AM  In your present state of health, do you have any difficulty performing the following activities:  Hearing? 0  Vision? 0  Difficulty concentrating or making decisions? 0  Walking or climbing stairs? 0  Dressing or bathing? 0  Doing errands, shopping? 0  Preparing Food and eating ? N  Using the Toilet? N  In the past six months, have you accidently leaked urine? N  Do you have problems with loss of bowel control? N  Managing your Medications? N  Managing your Finances? N  Housekeeping or managing your Housekeeping? N    Patient Care Team: Jolinda Norene HERO, DO as PCP - General (Family Medicine) Livingston Rigg, MD (Inactive) as Consulting Physician (Dermatology)  Indicate any recent Medical Services you may have received from other than [facility] providers in the past year (date may be approximate).     Assessment:   This is a routine wellness examination for Danean.  Hearing/Vision screen Hearing Screening - Comments:: Denies hearing difficulties   Vision Screening - Comments:: Wears rx glasses - up to date with routine eye exams with Dr. Ladora     Goals Addressed             This Visit's Progress     COMPLETED: Patient Stated       05/27/2021 AWV Goal: Fall Prevention  Over the next year, patient will decrease their risk for falls by: Using assistive devices, such as a cane or walker, as needed Identifying fall risks within their home and correcting them by: Removing throw rugs Adding handrails to stairs or ramps Removing clutter and keeping a clear pathway throughout the home Increasing light, especially at night Adding shower handles/bars Raising toilet seat Identifying potential personal risk factors for falls: Medication side effects Incontinence/urgency Vestibular dysfunction Hearing loss Musculoskeletal disorders Neurological disorders Orthostatic hypotension       Remain active and independent        Depression Screen    06/01/2023    8:35 AM 05/05/2023    2:17 PM 03/08/2023    8:04 AM 02/14/2023    1:02 PM 11/19/2022    8:20 AM 05/28/2022    1:23 PM 08/11/2021    9:51 AM  PHQ 2/9 Scores  PHQ - 2 Score 0 0 0 0 0 0 0  PHQ- 9 Score 0 0  0 0      Fall Risk    05/31/2023   11:37 AM 05/05/2023    2:17 PM 03/08/2023    8:04 AM 02/14/2023    1:02 PM 11/19/2022    8:21 AM  Fall Risk   Falls in the past year? 0 0 0 0 0  Number falls in past yr: 0   0 0  Injury with Fall? 0   0 0  Risk for fall due to : No Fall Risks   No Fall Risks No Fall Risks  Follow up Falls prevention discussed;Education provided;Falls evaluation completed   Education provided Education provided    MEDICARE RISK AT HOME: Medicare Risk at Home Any stairs in or around the home?: (Patient-Rptd) Yes If so, are there any without handrails?: (Patient-Rptd) No Home free of loose throw rugs in walkways, pet beds, electrical cords, etc?: (Patient-Rptd) Yes Adequate lighting in your home to reduce risk of falls?: (Patient-Rptd) Yes Life alert?: (Patient-Rptd) No  Use of a cane, walker or w/c?: (Patient-Rptd) No Grab bars in the bathroom?: (Patient-Rptd) No Shower chair or bench in shower?: (Patient-Rptd)  No Elevated toilet seat or a handicapped toilet?: (Patient-Rptd) No  TIMED UP AND GO:  Was the test performed?  No    Cognitive Function:    01/20/2018    3:27 PM  MMSE - Mini Mental State Exam  Orientation to time 5  Orientation to Place 5  Registration 3  Attention/ Calculation 5  Recall 3  Language- name 2 objects 2  Language- repeat 1  Language- follow 3 step command 3  Language- read & follow direction 1  Write a sentence 1  Copy design 1  Total score 30        06/01/2023    8:37 AM 05/28/2022    1:25 PM 05/26/2020    2:58 PM 01/22/2019    2:29 PM  6CIT Screen  What Year? 0 points 0 points 0 points 0 points  What month? 0 points 0 points 0 points 0 points  What time? 0 points 0 points 0 points 0 points  Count back from 20 0 points 0 points 0 points 0 points  Months in reverse 0 points 0 points 0 points 0 points  Repeat phrase 0 points 0 points 2 points 2 points  Total Score 0 points 0 points 2 points 2 points    Immunizations Immunization History  Administered Date(s) Administered   Fluad Quad(high Dose 65+) 02/05/2020, 04/10/2021, 02/10/2022   Fluad Trivalent(High Dose 65+) 02/14/2023   Influenza Split 06/13/2013   Influenza, High Dose Seasonal PF 06/06/2012   Influenza, Seasonal, Injecte, Preservative Fre 03/31/2015   Influenza,inj,Quad PF,6+ Mos 03/31/2015   Influenza,trivalent, recombinat, inj, PF 06/13/2013   PFIZER(Purple Top)SARS-COV-2 Vaccination 11/30/2019, 12/21/2019   Pneumococcal Conjugate-13 06/13/2013   Pneumococcal Polysaccharide-23 04/17/2010   Pneumococcal-Unspecified 04/17/2010   Td 04/27/2003   Td (Adult), 2 Lf Tetanus Toxid, Preservative Free 04/27/2003   Tdap 06/13/2013    TDAP status: Up to date  Flu Vaccine status: Up to date  Pneumococcal vaccine status: Up to date  Covid-19 vaccine status: Information provided on how to obtain vaccines.   Qualifies for Shingles Vaccine? Yes   Zostavax completed No   Shingrix Completed?: No.     Education has been provided regarding the importance of this vaccine. Patient has been advised to call insurance company to determine out of pocket expense if they have not yet received this vaccine. Advised may also receive vaccine at local pharmacy or Health Dept. Verbalized acceptance and understanding.  Screening Tests Health Maintenance  Topic Date Due   Zoster Vaccines- Shingrix (1 of 2) Never done   COVID-19 Vaccine (3 - Pfizer risk series) 01/18/2020   MAMMOGRAM  04/14/2023   DTaP/Tdap/Td (4 - Td or Tdap) 06/14/2023   Medicare Annual Wellness (AWV)  05/31/2024   DEXA SCAN  02/17/2025   Colonoscopy  09/29/2026   Pneumonia Vaccine 63+ Years old  Completed   INFLUENZA VACCINE  Completed   Hepatitis C Screening  Completed   HPV VACCINES  Aged Out    Health Maintenance  Health Maintenance Due  Topic Date Due   Zoster Vaccines- Shingrix (1 of 2) Never done   COVID-19 Vaccine (3 - Pfizer risk series) 01/18/2020   MAMMOGRAM  04/14/2023    Colorectal cancer screening: No longer required.   Mammogram status: No longer required due to age and preference .  Bone Density status: Completed 02/18/23. Results reflect: Bone density  results: OSTEOPOROSIS. Repeat every 2 years.  Lung Cancer Screening: (Low Dose CT Chest recommended if Age 66-80 years, 20 pack-year currently smoking OR have quit w/in 15years.) does not qualify.   Lung Cancer Screening Referral: n/a  Additional Screening:  Hepatitis C Screening: does qualify; Completed 05/25/18  Vision Screening: Recommended annual ophthalmology exams for early detection of glaucoma and other disorders of the eye. Is the patient up to date with their annual eye exam?  Yes  Who is the provider or what is the name of the office in which the patient attends annual eye exams? Dr.Le  If pt is not established with a provider, would they like to be referred to a provider to establish care? No .   Dental Screening: Recommended annual  dental exams for proper oral hygiene  Community Resource Referral / Chronic Care Management: CRR required this visit?  No   CCM required this visit?  No     Plan:     I have personally reviewed and noted the following in the patient's chart:   Medical and social history Use of alcohol, tobacco or illicit drugs  Current medications and supplements including opioid prescriptions. Patient is not currently taking opioid prescriptions. Functional ability and status Nutritional status Physical activity Advanced directives List of other physicians Hospitalizations, surgeries, and ER visits in previous 12 months Vitals Screenings to include cognitive, depression, and falls Referrals and appointments  In addition, I have reviewed and discussed with patient certain preventive protocols, quality metrics, and best practice recommendations. A written personalized care plan for preventive services as well as general preventive health recommendations were provided to patient.     Lavelle Pfeiffer Kelliher, [place]   10/27/7972   After Visit Summary: (MyChart) Due to this being a telephonic visit, the after visit summary with patients personalized plan was offered to patient via MyChart   Nurse Notes: No concerns at this time

## 2023-06-01 NOTE — Patient Instructions (Signed)
 Mary [name] , Thank you for taking time to come for your Medicare Wellness Visit. I appreciate your ongoing commitment to your health goals. Please review the following plan we discussed and let me know if I can assist you in the future.   Referrals/Orders/Follow-Ups/Clinician Recommendations: Aim for 30 minutes of exercise or brisk walking, 6-8 glasses of water, and 5 servings of fruits and vegetables each day.  This is a list of the screening recommended for you and due dates:  Health Maintenance  Topic Date Due   Zoster (Shingles) Vaccine (1 of 2) Never done   COVID-19 Vaccine (3 - Pfizer risk series) 01/18/2020   Mammogram  04/14/2023   DTaP/Tdap/Td vaccine (4 - Td or Tdap) 06/14/2023   Medicare Annual Wellness Visit  05/31/2024   DEXA scan (bone density measurement)  02/17/2025   Colon Cancer Screening  09/29/2026   Pneumonia Vaccine  Completed   Flu Shot  Completed   Hepatitis C Screening  Completed   HPV Vaccine  Aged Out    Advanced directives: (In Chart) A copy of your advanced directives are scanned into your chart should your provider ever need it.  Next Medicare Annual Wellness Visit scheduled for next year: Yes

## 2023-06-08 ENCOUNTER — Other Ambulatory Visit: Payer: Self-pay | Admitting: Family Medicine

## 2023-06-08 DIAGNOSIS — I10 Essential (primary) hypertension: Secondary | ICD-10-CM

## 2023-06-17 ENCOUNTER — Encounter: Payer: Self-pay | Admitting: Family Medicine

## 2023-06-17 ENCOUNTER — Ambulatory Visit: Payer: Medicare Other | Admitting: Family Medicine

## 2023-06-17 VITALS — BP 124/68 | HR 68 | Temp 98.6°F | Ht 63.0 in | Wt 101.0 lb

## 2023-06-17 DIAGNOSIS — N390 Urinary tract infection, site not specified: Secondary | ICD-10-CM | POA: Diagnosis not present

## 2023-06-17 DIAGNOSIS — R634 Abnormal weight loss: Secondary | ICD-10-CM | POA: Diagnosis not present

## 2023-06-17 DIAGNOSIS — Z23 Encounter for immunization: Secondary | ICD-10-CM

## 2023-06-17 DIAGNOSIS — I480 Paroxysmal atrial fibrillation: Secondary | ICD-10-CM

## 2023-06-17 NOTE — Progress Notes (Signed)
Subjective: CC: Weight check, general follow-up PCP: Raliegh Ip, DO [name] is a 78 y.o. female presenting to clinic today for:  1.  Unexplained weight loss She reports that she is actually declined quite a bit after our last checkup.  She started having recurrent urinary tract infections and this made her so ill that she really could not eat well and became weak.  She had gotten down to less than 90 pounds at 1 point.  She was seen by several providers here in office and ultimately referred to [facility]rology in [place].  After multiple attempts to treat, she was ultimately placed on daily Keflex as a prevention and notes that this seems to be doing well so far.  She had imaging of the bladder and pelvis and they will be discussing the results in the next 2 weeks.  Since all of this has occurred, her son has encouraged her to consider relocating to closer to where they live in [place].  She is looking for a place in [place].  This is where her church is.  She is hoping to establish care with a geriatrician   ROS: Per HPI  Allergies  Allergen Reactions   Fosamax [Alendronate Sodium]     Dizziness, elevated blood pressure   Sertraline Nausea Only    Insomnia, did not feel well   Sulfa Antibiotics Nausea Only   Past Medical History:  Diagnosis Date   Age-related osteoporosis without current pathological fracture    Anemia    Anxiety    Arthritis    Cataract    Hypertension    Hyponatremia 08/20/2014   Related to HCTZ, advised by prior MD to stop the HCTZ, she is convinced that she needs it to control her BP Sodium as low as 127 on 06/21/14   Irregular heart beat     Current Outpatient Medications:    amLODipine (NORVASC) 5 MG tablet, TAKE 1 TABLET BY MOUTH DAILY, Disp: 100 tablet, Rfl: 2   aspirin EC 81 MG tablet, Take 1 tablet (81 mg total) by mouth daily. Swallow whole., Disp: 90 tablet, Rfl: 3   cephALEXin (KEFLEX) 250 MG capsule, Take 250 mg by  mouth at bedtime., Disp: , Rfl:    Cholecalciferol (VITAMIN D3) 125 MCG (5000 UT) CAPS, Take 5,000 Units by mouth., Disp: , Rfl:    ferrous sulfate 325 (65 FE) MG tablet, Take 325 mg by mouth daily with breakfast., Disp: , Rfl:    flecainide (TAMBOCOR) 100 MG tablet, TAKE 1 TABLET BY MOUTH TWICE  DAILY, Disp: 200 tablet, Rfl: 3   gabapentin (NEURONTIN) 100 MG capsule, Take 1 capsule (100 mg total) by mouth 2 (two) times daily as needed., Disp: 200 capsule, Rfl: 3   irbesartan (AVAPRO) 300 MG tablet, TAKE 1 TABLET BY MOUTH DAILY, Disp: 90 tablet, Rfl: 0   metoprolol succinate (TOPROL-XL) 50 MG 24 hr tablet, TAKE 1 TABLET BY MOUTH DAILY, Disp: 100 tablet, Rfl: 2   Multiple Vitamins-Minerals (MULTIVITAMIN WITH MINERALS) tablet, Take 1 tablet by mouth daily., Disp: , Rfl:    pravastatin (PRAVACHOL) 20 MG tablet, TAKE 1 TABLET BY MOUTH DAILY, Disp: 100 tablet, Rfl: 0 Social History   Socioeconomic History   Marital status: Widowed    Spouse name: Not on file   Number of children: 3   Years of education: 33   Highest education level: 12th grade  Occupational History   Occupation: retired  Tobacco Use   Smoking status: Never  Smokeless tobacco: Never  Vaping Use   Vaping status: Never Used  Substance and Sexual Activity   Alcohol use: Yes    Alcohol/week: 7.0 standard drinks of alcohol    Types: 7 Glasses of wine per week   Drug use: No   Sexual activity: Not Currently  Other Topics Concern   Not on file  Social History Narrative   Lives alone. Two sons and a daughter.  Widow.     Social Drivers of Corporate investment banker Strain: Low Risk  (06/01/2023)   Overall Financial Resource Strain (CARDIA)    Difficulty of Paying Living Expenses: Not hard at all  Food Insecurity: No Food Insecurity (06/01/2023)   Hunger Vital Sign    Worried About Running Out of Food in the Last Year: Never true    Ran Out of Food in the Last Year: Never true  Transportation Needs: No Transportation Needs  (06/01/2023)   PRAPARE - Administrator, Civil Service (Medical): No    Lack of Transportation (Non-Medical): No  Physical Activity: Sufficiently Active (06/01/2023)   Exercise Vital Sign    Days of Exercise per Week: 5 days    Minutes of Exercise per Session: 30 min  Stress: No Stress Concern Present (06/01/2023)   [employer] of Occupational Health - Occupational Stress Questionnaire    Feeling of Stress : Not at all  Social Connections: Moderately Integrated (06/01/2023)   Social Connection and Isolation Panel [NHANES]    Frequency of Communication with Friends and Family: More than three times a week    Frequency of Social Gatherings with Friends and Family: Three times a week    Attends Religious Services: More than 4 times per year    Active Member of Clubs or Organizations: Yes    Attends Banker Meetings: More than 4 times per year    Marital Status: Widowed  Intimate Partner Violence: Not At Risk (06/01/2023)   Humiliation, Afraid, Rape, and Kick questionnaire    Fear of Current or Ex-Partner: No    Emotionally Abused: No    Physically Abused: No    Sexually Abused: No   Family History  Problem Relation Age of Onset   Arthritis Mother    CAD Mother 59   Hypertension Mother    Hyperlipidemia Mother    Kidney disease Mother    Heart disease Mother    Hypertension Brother    Stroke Maternal Grandmother    Heart disease Maternal Grandfather    Arthritis Paternal Grandmother    Breast cancer Neg Hx     Objective: Office vital signs reviewed. BP 124/68   Pulse 68   Temp 98.6 F (37 C)   Ht 5\' 3"  (1.6 m)   Wt 101 lb (45.8 kg)   SpO2 99%   BMI 17.89 kg/m   Physical Examination:  General: Awake, alert, thin, petite elderly female, No acute distress HEENT: Sclera white.  Moist mucous membranes Cardio: regular rate and rhythm, S1S2 heard, no murmurs appreciated Pulm: clear to auscultation bilaterally, no wheezes, rhonchi or rales; normal work  of breathing on room air GU: No CVA tenderness palpation.  No suprapubic tenderness to palpation.  No palpable abdominal masses or organomegaly  Assessment/ Plan: 78 y.o. female   Recurrent UTI  Unexplained weight loss  Paroxysmal atrial fibrillation (HCC)  Recurrent UTIs have responded well to Keflex prophylaxis.  I could not visualize a CT of the pelvis so we will have to wait on  urology appointment and notes.  Encourage p.o. hydration and continue to probiotic as directed by urology  Her weight has been stable since my last visit with her but in between she certainly had some fluctuation this seems to be related to acute illness.  She had rate and rhythm controlled heart sounds today.  I will be very sad to have her leave my care but I am glad to give her some names of trusted colleagues that I think would be a great fit for her.  She will reach out to me for these names when she is ready to make that transition in care  Tetanus vaccination administered today  Raliegh Ip, DO [facility] [phone]

## 2023-06-30 DIAGNOSIS — R3982 Chronic bladder pain: Secondary | ICD-10-CM | POA: Diagnosis not present

## 2023-06-30 DIAGNOSIS — N3021 Other chronic cystitis with hematuria: Secondary | ICD-10-CM | POA: Diagnosis not present

## 2023-07-13 DIAGNOSIS — N3021 Other chronic cystitis with hematuria: Secondary | ICD-10-CM | POA: Diagnosis not present

## 2023-07-13 DIAGNOSIS — M62838 Other muscle spasm: Secondary | ICD-10-CM | POA: Diagnosis not present

## 2023-07-13 DIAGNOSIS — K5900 Constipation, unspecified: Secondary | ICD-10-CM | POA: Diagnosis not present

## 2023-07-13 DIAGNOSIS — R3982 Chronic bladder pain: Secondary | ICD-10-CM | POA: Diagnosis not present

## 2023-08-09 ENCOUNTER — Other Ambulatory Visit: Payer: Self-pay | Admitting: Family Medicine

## 2023-08-09 DIAGNOSIS — I10 Essential (primary) hypertension: Secondary | ICD-10-CM

## 2023-08-10 ENCOUNTER — Other Ambulatory Visit: Payer: Self-pay | Admitting: Family Medicine

## 2023-08-10 DIAGNOSIS — E782 Mixed hyperlipidemia: Secondary | ICD-10-CM

## 2023-08-19 ENCOUNTER — Encounter: Payer: Self-pay | Admitting: Family Medicine

## 2023-08-19 ENCOUNTER — Ambulatory Visit: Payer: Self-pay

## 2023-08-19 ENCOUNTER — Other Ambulatory Visit: Payer: Self-pay | Admitting: Cardiology

## 2023-08-19 ENCOUNTER — Telehealth: Admitting: Family Medicine

## 2023-08-19 DIAGNOSIS — R399 Unspecified symptoms and signs involving the genitourinary system: Secondary | ICD-10-CM | POA: Diagnosis not present

## 2023-08-19 DIAGNOSIS — I10 Essential (primary) hypertension: Secondary | ICD-10-CM

## 2023-08-19 LAB — URINALYSIS, ROUTINE W REFLEX MICROSCOPIC
Bilirubin, UA: NEGATIVE
Glucose, UA: NEGATIVE
Ketones, UA: NEGATIVE
Nitrite, UA: NEGATIVE
Protein,UA: NEGATIVE
Specific Gravity, UA: 1.01 (ref 1.005–1.030)
Urobilinogen, Ur: 0.2 mg/dL (ref 0.2–1.0)
pH, UA: 7 (ref 5.0–7.5)

## 2023-08-19 LAB — MICROSCOPIC EXAMINATION
Bacteria, UA: NONE SEEN
Renal Epithel, UA: NONE SEEN /HPF
Yeast, UA: NONE SEEN

## 2023-08-19 NOTE — Progress Notes (Signed)
 MyChart Video visit  Subjective: CC:UTI PCP: Eliodoro Guerin, DO JWJ:XBJYNW Mary [name] is a 78 y.o. female. Patient provides verbal consent for consult held via video.  Due to COVID-19 pandemic this visit was conducted virtually. This visit type was conducted due to national recommendations for restrictions regarding the COVID-19 Pandemic (e.g. social distancing, sheltering in place) in an effort to limit this patient's exposure and mitigate transmission in our community. All issues noted in this document were discussed and addressed.  A physical exam was not performed with this format.   Location of patient: home Location of provider: [facility] Others present for call: none  1. UTI She reports lower abdominal irritation that is relieved by tylenol.  She started a probiotic for women.  Started urquora when symptoms started wed and she felt great after this.  Last night she up all night urinating.  She is hydrating well. No dysuria, hematuria, fevers.  She wanted to be super precautious because of recurrent UTI.  She is in the middle of moving and notes that has been a little stressful but she has otherwise been feeling well.   ROS: Per HPI  Allergies  Allergen Reactions   Fosamax  [Alendronate  Sodium]     Dizziness, elevated blood pressure   Sertraline Nausea Only    Insomnia, did not feel well   Sulfa Antibiotics Nausea Only   Past Medical History:  Diagnosis Date   Age-related osteoporosis without current pathological fracture    Anemia    Anxiety    Arthritis    Cataract    Hypertension    Hyponatremia 08/20/2014   Related to HCTZ, advised by prior MD to stop the HCTZ, she is convinced that she needs it to control her BP Sodium as low as 127 on 06/21/14   Irregular heart beat     Current Outpatient Medications:    amLODipine  (NORVASC ) 5 MG tablet, TAKE 1 TABLET BY MOUTH DAILY, Disp: 100 tablet, Rfl: 2   aspirin  EC 81 MG tablet, Take 1 tablet (81 mg total) by mouth daily.  Swallow whole., Disp: 90 tablet, Rfl: 3   cephALEXin  (KEFLEX ) 250 MG capsule, Take 250 mg by mouth at bedtime., Disp: , Rfl:    Cholecalciferol (VITAMIN D3) 125 MCG (5000 UT) CAPS, Take 5,000 Units by mouth., Disp: , Rfl:    ferrous sulfate 325 (65 FE) MG tablet, Take 325 mg by mouth daily with breakfast., Disp: , Rfl:    flecainide  (TAMBOCOR ) 100 MG tablet, TAKE 1 TABLET BY MOUTH TWICE  DAILY, Disp: 200 tablet, Rfl: 3   gabapentin  (NEURONTIN ) 100 MG capsule, Take 1 capsule (100 mg total) by mouth 2 (two) times daily as needed., Disp: 200 capsule, Rfl: 3   irbesartan  (AVAPRO ) 300 MG tablet, TAKE 1 TABLET BY MOUTH DAILY, Disp: 90 tablet, Rfl: 0   metoprolol  succinate (TOPROL -XL) 50 MG 24 hr tablet, TAKE 1 TABLET BY MOUTH DAILY, Disp: 100 tablet, Rfl: 2   Multiple Vitamins-Minerals (MULTIVITAMIN WITH MINERALS) tablet, Take 1 tablet by mouth daily., Disp: , Rfl:    pravastatin  (PRAVACHOL ) 20 MG tablet, TAKE 1 TABLET BY MOUTH DAILY, Disp: 100 tablet, Rfl: 0  Gen: well appearing female, NAD  Assessment/ Plan: 78 y.o. female   Urinary symptom or sign - Plan: Urinalysis, Routine w reflex microscopic, Urine Culture  Urinalysis without evidence of UTI.  I did go ahead and send for culture just for reassurance.  Okay to continue probiotic and Eucora as needed.  Continue hydration.  Follow-up in June,  sooner if concerns arise  Start time: 12:19pm (1st text sent), 12:21pm (2nd text sent); 12:22pm End time: 12:25pm  Total time spent on patient care (including video visit/ documentation): 5 minutes  Demisha Nokes Bambi Bonine, DO [facility] [phone]

## 2023-08-19 NOTE — Telephone Encounter (Signed)
Noted  -LS

## 2023-08-19 NOTE — Telephone Encounter (Signed)
   Chief Complaint: urinary symptoms Symptoms: discomfort; increase In frequency   Disposition: [] ED /[] Urgent Care (no appt availability in office) / [x] Appointment(In office/virtual)/ []  [facility]/ [] Home Care/ [] Refused Recommended Disposition /[] [facility]/ []  Follow-up with PCP Additional Notes: Pt called with UTI symptoms. Pt has history of recurring UTIs.  Pt just finished a round of Keflex  on 4/19 and on 4/23  became symptomatic with bladder discomfort and increase in frequency. Pt denies fever and blood in urine. Pt is under the care of Urologist as well and used Uqora flush on Wednesday. Pt stated that has helped some. Pt has been taking OTC Tylenol for discomfort.  Pt has appt today with PCP @ 1315. RN gave care advice and pt verbalized understanding.           Copied from CRM [id number]. Topic: Clinical - Medical Advice >> Aug 19, 2023  8:17 AM Leory Rands wrote: Reason for CRM: Patient is calling to report that she is have vaginal discomfort. Previous history UTI. Previously prescribed cephALEXin  (KEFLEX ) 250 MG capsule [[id number]] 05/14/2023 by urology. No available appointments at [facility]. Please advise with patient where she can be evaluated. Reason for Disposition  Urinating more frequently than usual (i.e., frequency)  Answer Assessment - Initial Assessment Questions 1. SYMPTOM: "What's the main symptom you're concerned about?" (e.g., frequency, incontinence)     Bladder pain, increased frequency  2. ONSET: "When did the   start?"     Wednesday 3. PAIN: "Is there any pain?" If Yes, ask: "How bad is it?" (Scale: 1-10; mild, moderate, severe)     "Discomfort in bottom of bladder 4. CAUSE: "What do you think is causing the symptoms?"     Recurrent UTI  5. OTHER SYMPTOMS: "Do you have any other symptoms?" (e.g., blood in urine, fever, flank pain, pain with urination)     Denies  Protocols used: Urinary Symptoms-A-AH

## 2023-08-21 LAB — URINE CULTURE

## 2023-08-22 ENCOUNTER — Encounter: Payer: Self-pay | Admitting: Family Medicine

## 2023-09-07 DIAGNOSIS — R3982 Chronic bladder pain: Secondary | ICD-10-CM | POA: Diagnosis not present

## 2023-09-07 DIAGNOSIS — K5909 Other constipation: Secondary | ICD-10-CM | POA: Diagnosis not present

## 2023-09-07 DIAGNOSIS — M62838 Other muscle spasm: Secondary | ICD-10-CM | POA: Diagnosis not present

## 2023-09-22 DIAGNOSIS — M62838 Other muscle spasm: Secondary | ICD-10-CM | POA: Diagnosis not present

## 2023-09-22 DIAGNOSIS — R3982 Chronic bladder pain: Secondary | ICD-10-CM | POA: Diagnosis not present

## 2023-09-22 DIAGNOSIS — K5900 Constipation, unspecified: Secondary | ICD-10-CM | POA: Diagnosis not present

## 2023-10-03 DIAGNOSIS — N3021 Other chronic cystitis with hematuria: Secondary | ICD-10-CM | POA: Diagnosis not present

## 2023-10-03 DIAGNOSIS — R3982 Chronic bladder pain: Secondary | ICD-10-CM | POA: Diagnosis not present

## 2023-10-03 DIAGNOSIS — R311 Benign essential microscopic hematuria: Secondary | ICD-10-CM | POA: Diagnosis not present

## 2023-10-17 ENCOUNTER — Ambulatory Visit (INDEPENDENT_AMBULATORY_CARE_PROVIDER_SITE_OTHER): Payer: Medicare Other | Admitting: Family Medicine

## 2023-10-17 ENCOUNTER — Encounter: Payer: Self-pay | Admitting: Family Medicine

## 2023-10-17 VITALS — BP 127/64 | HR 60 | Temp 98.5°F | Ht 63.0 in | Wt 104.0 lb

## 2023-10-17 DIAGNOSIS — I480 Paroxysmal atrial fibrillation: Secondary | ICD-10-CM | POA: Diagnosis not present

## 2023-10-17 DIAGNOSIS — R634 Abnormal weight loss: Secondary | ICD-10-CM

## 2023-10-17 NOTE — Progress Notes (Signed)
 Subjective: CC: Weight check PCP: Jolinda Mary HERO, DO YEP:Mary [name] is a 78 y.o. female presenting to clinic today for:  1.  Weight check Patient here for follow-up on unplanned weight loss.  Since her last visit she has continued to gain weight.  She is in the midst of selling her home in [place] and looking for a new home somewhere in [place].  She is hoping that she can find something that has land in the country but also be closer to her son who currently lives 30 miles away.  Overall she has felt better.  Energy has been better, strength better and mood better.  She of course does have some grieving while she is getting ready to move out of her home, which her late husband built.   ROS: Per HPI  Allergies  Allergen Reactions   Fosamax  [Alendronate  Sodium]     Dizziness, elevated blood pressure   Sertraline Nausea Only    Insomnia, did not feel well   Sulfa Antibiotics Nausea Only   Past Medical History:  Diagnosis Date   Age-related osteoporosis without current pathological fracture    Anemia    Anxiety    Arthritis    Cataract    Hypertension    Hyponatremia 08/20/2014   Related to HCTZ, advised by prior MD to stop the HCTZ, she is convinced that she needs it to control her BP Sodium as low as 127 on 06/21/14   Irregular heart beat     Current Outpatient Medications:    amLODipine  (NORVASC ) 5 MG tablet, TAKE 1 TABLET BY MOUTH DAILY, Disp: 100 tablet, Rfl: 3   aspirin  EC 81 MG tablet, Take 1 tablet (81 mg total) by mouth daily. Swallow whole., Disp: 90 tablet, Rfl: 3   cephALEXin  (KEFLEX ) 250 MG capsule, Take 250 mg by mouth at bedtime., Disp: , Rfl:    Cholecalciferol (VITAMIN D3) 125 MCG (5000 UT) CAPS, Take 5,000 Units by mouth., Disp: , Rfl:    ferrous sulfate 325 (65 FE) MG tablet, Take 325 mg by mouth daily with breakfast., Disp: , Rfl:    flecainide  (TAMBOCOR ) 100 MG tablet, TAKE 1 TABLET BY MOUTH TWICE  DAILY, Disp: 200 tablet, Rfl: 3    gabapentin  (NEURONTIN ) 100 MG capsule, Take 1 capsule (100 mg total) by mouth 2 (two) times daily as needed., Disp: 200 capsule, Rfl: 3   irbesartan  (AVAPRO ) 300 MG tablet, TAKE 1 TABLET BY MOUTH DAILY, Disp: 90 tablet, Rfl: 0   metoprolol  succinate (TOPROL -XL) 50 MG 24 hr tablet, TAKE 1 TABLET BY MOUTH DAILY, Disp: 100 tablet, Rfl: 3   Multiple Vitamins-Minerals (MULTIVITAMIN WITH MINERALS) tablet, Take 1 tablet by mouth daily., Disp: , Rfl:    pravastatin  (PRAVACHOL ) 20 MG tablet, TAKE 1 TABLET BY MOUTH DAILY, Disp: 100 tablet, Rfl: 0 Social History   Socioeconomic History   Marital status: Widowed    Spouse name: Not on file   Number of children: 3   Years of education: 64   Highest education level: 12th grade  Occupational History   Occupation: retired  Tobacco Use   Smoking status: Never   Smokeless tobacco: Never  Vaping Use   Vaping status: Never Used  Substance and Sexual Activity   Alcohol use: Yes    Alcohol/week: 7.0 standard drinks of alcohol    Types: 7 Glasses of wine per week   Drug use: No   Sexual activity: Not Currently  Other Topics Concern   Not on file  Social History Narrative   Lives alone. Two sons and a daughter.  Widow.     Social Drivers of Corporate investment banker Strain: Low Risk  (10/14/2023)   Overall Financial Resource Strain (CARDIA)    Difficulty of Paying Living Expenses: Not hard at all  Food Insecurity: No Food Insecurity (10/14/2023)   Hunger Vital Sign    Worried About Running Out of Food in the Last Year: Never true    Ran Out of Food in the Last Year: Never true  Transportation Needs: No Transportation Needs (10/14/2023)   PRAPARE - Administrator, Civil Service (Medical): No    Lack of Transportation (Non-Medical): No  Physical Activity: Sufficiently Active (10/14/2023)   Exercise Vital Sign    Days of Exercise per Week: 7 days    Minutes of Exercise per Session: 30 min  Stress: No Stress Concern Present (10/14/2023)    [employer] of Occupational Health - Occupational Stress Questionnaire    Feeling of Stress: Not at all  Social Connections: Moderately Integrated (10/14/2023)   Social Connection and Isolation Panel    Frequency of Communication with Friends and Family: More than three times a week    Frequency of Social Gatherings with Friends and Family: Once a week    Attends Religious Services: More than 4 times per year    Active Member of [employer] or Organizations: Yes    Attends Banker Meetings: More than 4 times per year    Marital Status: Widowed  Intimate Partner Violence: Not At Risk (06/01/2023)   Humiliation, Afraid, Rape, and Kick questionnaire    Fear of Current or Ex-Partner: No    Emotionally Abused: No    Physically Abused: No    Sexually Abused: No   Family History  Problem Relation Age of Onset   Arthritis Mother    CAD Mother 8   Hypertension Mother    Hyperlipidemia Mother    Kidney disease Mother    Heart disease Mother    Hypertension Brother    Stroke Maternal Grandmother    Heart disease Maternal Grandfather    Arthritis Paternal Grandmother    Breast cancer Neg Hx     Objective: Office vital signs reviewed. BP 127/64   Pulse 60   Temp 98.5 F (36.9 C)   Ht 5' 3 (1.6 m)   Wt 104 lb (47.2 kg)   SpO2 97%   BMI 18.42 kg/m   Physical Examination:  General: Awake, alert, well nourished, No acute distress HEENT:  sclera white, MMM Cardio: regular rate and rhythm, S1S2 heard, no murmurs appreciated Pulm: clear to auscultation bilaterally, no wheezes, rhonchi or rales; normal work of breathing on room air     10/17/2023   12:55 PM 06/17/2023   12:55 PM 06/01/2023    8:35 AM  Depression screen PHQ 2/9  Decreased Interest 0 0 0  Down, Depressed, Hopeless 0 0 0  PHQ - 2 Score 0 0 0  Altered sleeping 0 0 0  Tired, decreased energy 0 0 0  Change in appetite 0 0 0  Feeling bad or failure about yourself  0 0 0  Trouble concentrating 0 0 0   Moving slowly or fidgety/restless 0 0 0  Suicidal thoughts 0 0 0  PHQ-9 Score 0 0 0  Difficult doing work/chores Not difficult at all Not difficult at all Not difficult at all      10/17/2023   12:54 PM 06/17/2023  12:54 PM 05/05/2023    2:17 PM 02/14/2023    1:02 PM  GAD 7 : Generalized Anxiety Score  Nervous, Anxious, on Edge 0 0 0 0  Control/stop worrying 0 0 0 0  Worry too much - different things 0 0 0 0  Trouble relaxing 0 0 0 0  Restless 0 0 0 0  Easily annoyed or irritable 0 0 0 0  Afraid - awful might happen 0 0 0 0  Total GAD 7 Score 0 0 0 0  Anxiety Difficulty Not difficult at all Not difficult at all Not difficult at all Not difficult at all      Assessment/ Plan: 78 y.o. female   Paroxysmal atrial fibrillation (HCC)  Unexplained weight loss  A-fib is rate and rhythm controlled.  She continues to gain weight and seems to be in a better state of health.   Mary CHRISTELLA Fielding, DO [facility] [phone]

## 2023-10-20 ENCOUNTER — Encounter: Payer: Self-pay | Admitting: Family Medicine

## 2023-10-20 ENCOUNTER — Ambulatory Visit: Admitting: Family Medicine

## 2023-10-20 VITALS — BP 129/69 | HR 60 | Temp 98.0°F | Ht 63.0 in | Wt 102.6 lb

## 2023-10-20 DIAGNOSIS — R5381 Other malaise: Secondary | ICD-10-CM

## 2023-10-20 DIAGNOSIS — Z87440 Personal history of urinary (tract) infections: Secondary | ICD-10-CM

## 2023-10-20 DIAGNOSIS — R300 Dysuria: Secondary | ICD-10-CM | POA: Diagnosis not present

## 2023-10-20 LAB — URINALYSIS, ROUTINE W REFLEX MICROSCOPIC
Bilirubin, UA: NEGATIVE
Glucose, UA: NEGATIVE
Ketones, UA: NEGATIVE
Leukocytes,UA: NEGATIVE
Nitrite, UA: NEGATIVE
Protein,UA: NEGATIVE
Specific Gravity, UA: 1.015 (ref 1.005–1.030)
Urobilinogen, Ur: 0.2 mg/dL (ref 0.2–1.0)
pH, UA: 7 (ref 5.0–7.5)

## 2023-10-20 LAB — MICROSCOPIC EXAMINATION
Bacteria, UA: NONE SEEN
Renal Epithel, UA: NONE SEEN /HPF
WBC, UA: NONE SEEN /HPF (ref 0–5)
Yeast, UA: NONE SEEN

## 2023-10-20 NOTE — Progress Notes (Signed)
   Acute Office Visit  Subjective:     Patient ID: Mary [name], female    DOB: [birth year], 78 y.o.   MRN: [id number]  Chief Complaint  Patient presents with   Dysuria    Dysuria    Patient is in today for possible UTI. She denies any urinary symptoms. She just hasn't felt well overall this week - just blah. Denies abdominal pain, fever, flank pain. She has a hx of recurrent UTIs and was concerned about the possibility of a UTI since she didn't feel well. She has been under more stress lately as she is in the process of selling her home. She hasn't been sleeping well either with the increased stress.   Review of Systems  Genitourinary:  Positive for dysuria.   As per HPI.      Objective:    BP 129/69   Pulse 60   Temp 98 F (36.7 C) (Temporal)   Ht 5' 3 (1.6 m)   Wt 102 lb 9.6 oz (46.5 kg)   SpO2 95%   BMI 18.17 kg/m    Physical Exam Vitals and nursing note reviewed.  Constitutional:      General: She is not in acute distress.    Appearance: Normal appearance. She is not ill-appearing, toxic-appearing or diaphoretic.   Cardiovascular:     Rate and Rhythm: Normal rate and regular rhythm.     Heart sounds: Normal heart sounds. No murmur heard. Pulmonary:     Effort: Pulmonary effort is normal. No respiratory distress.     Breath sounds: Normal breath sounds. No wheezing, rhonchi or rales.   Musculoskeletal:     Right lower leg: No edema.     Left lower leg: No edema.   Skin:    General: Skin is warm and dry.   Neurological:     General: No focal deficit present.     Mental Status: She is alert and oriented to person, place, and time.   Psychiatric:        Mood and Affect: Mood normal.        Behavior: Behavior normal.     Urine dipstick shows positive for RBC's.  Micro exam: 0-5 WBC's per HPF, 0-2 RBC's per HPF, and no + bacteria.      Assessment & Plan:   Mary [name] was seen today for dysuria.  Diagnoses and all orders for this  visit:  Malaise No acute symptoms outside of malaise. Negative UTI. Discussed due to increased stress and difficulty sleeping.   History of recurrent UTIs -     Urinalysis, Routine w reflex microscopic  Return if symptoms worsen or fail to improve.  The patient indicates understanding of these issues and agrees with the plan.  Mary CHRISTELLA Search, FNP

## 2023-10-21 ENCOUNTER — Other Ambulatory Visit: Payer: Self-pay | Admitting: Family Medicine

## 2023-10-21 DIAGNOSIS — E782 Mixed hyperlipidemia: Secondary | ICD-10-CM

## 2023-11-03 ENCOUNTER — Other Ambulatory Visit: Payer: Self-pay | Admitting: Family Medicine

## 2023-11-03 DIAGNOSIS — I10 Essential (primary) hypertension: Secondary | ICD-10-CM

## 2023-12-07 ENCOUNTER — Encounter: Payer: Self-pay | Admitting: Family Medicine

## 2023-12-07 ENCOUNTER — Telehealth: Admitting: Family Medicine

## 2023-12-07 ENCOUNTER — Ambulatory Visit: Payer: Self-pay | Admitting: Family Medicine

## 2023-12-07 DIAGNOSIS — F4329 Adjustment disorder with other symptoms: Secondary | ICD-10-CM

## 2023-12-07 DIAGNOSIS — F419 Anxiety disorder, unspecified: Secondary | ICD-10-CM | POA: Diagnosis not present

## 2023-12-07 DIAGNOSIS — I480 Paroxysmal atrial fibrillation: Secondary | ICD-10-CM

## 2023-12-07 MED ORDER — LORAZEPAM 0.5 MG PO TABS: 0.5000 mg | ORAL_TABLET | Freq: Two times a day (BID) | ORAL | 1 refills | Status: DC | PRN

## 2023-12-07 NOTE — Telephone Encounter (Signed)
 Ok to put her on for a virtual visit with me today if she is willing to do that but by law she has to have a face to face visit for controlled meds.  Just double book the no show from earlier and I will text her a link asap.

## 2023-12-07 NOTE — Telephone Encounter (Signed)
 Appointment scheduled.

## 2023-12-07 NOTE — Progress Notes (Signed)
 MyChart Video visit  Subjective: CC: Panic attack, atrial fibrillation PCP: Jolinda Norene HERO, DO YEP:Mary [name] is a 78 y.o. female. Patient provides verbal consent for consult held via video.  Due to COVID-19 pandemic this visit was conducted virtually. This visit type was conducted due to national recommendations for restrictions regarding the COVID-19 Pandemic (e.g. social distancing, sheltering in place) in an effort to limit this patient's exposure and mitigate transmission in our community. All issues noted in this document were discussed and addressed.  A physical exam was not performed with this format.   Location of patient: home Location of provider: [facility] Others present for call: none  1.  Panic attack, GAD, grief, atrial fibrillation She reports that she has been under more stress as of late.  She just sold her home and is in the midst of moving into a new townhome in [place].  She also has an upcoming wedding.  She is just been a bit overworked and overwhelmed and notes that she started having heart palpitations.  She has known atrial fibrillation.  She took her flecainide  as directed and actually laid down to do some vasovagal exercises in efforts to alleviate her her palpitations and anxiety attack and this did seem to help to some degree.  She is not currently symptomatic but wants to have something on hand just in case because she has an upcoming trip to [place]  and worries about ruining the wedding because of a panic attack.  Cannot take SSRIs or SNRIs due to medication induced hyponatremia.  She was previously prescribed Xanax  0.25 mg and still has pills leftover from 2019, 1 of which she took this morning and this did seem to help but she was worried about taking old medications.  She wants to know if this is something she might have on hand for as needed use   ROS: Per HPI  Allergies  Allergen Reactions   Fosamax  [Alendronate  Sodium]     Dizziness,  elevated blood pressure   Sertraline Nausea Only    Insomnia, did not feel well   Sulfa Antibiotics Nausea Only   Past Medical History:  Diagnosis Date   Age-related osteoporosis without current pathological fracture    Anemia    Anxiety    Arthritis    Cataract    Hypertension    Hyponatremia 08/20/2014   Related to HCTZ, advised by prior MD to stop the HCTZ, she is convinced that she needs it to control her BP Sodium as low as 127 on 06/21/14   Irregular heart beat     Current Outpatient Medications:    amLODipine  (NORVASC ) 5 MG tablet, TAKE 1 TABLET BY MOUTH DAILY, Disp: 100 tablet, Rfl: 3   aspirin  EC 81 MG tablet, Take 1 tablet (81 mg total) by mouth daily. Swallow whole., Disp: 90 tablet, Rfl: 3   Cholecalciferol (VITAMIN D3) 125 MCG (5000 UT) CAPS, Take 5,000 Units by mouth., Disp: , Rfl:    ferrous sulfate 325 (65 FE) MG tablet, Take 325 mg by mouth daily with breakfast., Disp: , Rfl:    flecainide  (TAMBOCOR ) 100 MG tablet, TAKE 1 TABLET BY MOUTH TWICE  DAILY, Disp: 200 tablet, Rfl: 3   gabapentin  (NEURONTIN ) 100 MG capsule, Take 1 capsule (100 mg total) by mouth 2 (two) times daily as needed., Disp: 200 capsule, Rfl: 3   irbesartan  (AVAPRO ) 300 MG tablet, TAKE 1 TABLET BY MOUTH DAILY, Disp: 100 tablet, Rfl: 1   metoprolol  succinate (TOPROL -XL) 50 MG 24 hr  tablet, TAKE 1 TABLET BY MOUTH DAILY, Disp: 100 tablet, Rfl: 3   Multiple Vitamins-Minerals (MULTIVITAMIN WITH MINERALS) tablet, Take 1 tablet by mouth daily., Disp: , Rfl:    pravastatin  (PRAVACHOL ) 20 MG tablet, TAKE 1 TABLET BY MOUTH DAILY, Disp: 100 tablet, Rfl: 1  Gen: Nontoxic-appearing female Psych: Mood stable, speech normal, affect appropriate, very pleasant and interactive  Assessment/ Plan: 78 y.o. female   Grief reaction with prolonged bereavement - Plan: LORazepam  (ATIVAN ) 0.5 MG tablet  Anxiety - Plan: LORazepam  (ATIVAN ) 0.5 MG tablet  Paroxysmal atrial fibrillation (HCC) - Plan: LORazepam  (ATIVAN ) 0.5 MG  tablet  [employer] reviewed and there were no red flags.  Unable to take SSRIs and many of the SNRIs due to history of hyponatremia that was medication induced previously.  She has been on Xanax  in the past but given age and long half-life of Xanax  I favor utilization of as needed Ativan .  We discussed that this is considered a high risk medication in her age population and in general.  She has had no difficulty with dependency in the past nor any difficulty with discontinuation of the medication in the past.  Reinforced need to use this sparingly and I think she will have no problem with that as she still has Xanax  from 2019 that she has not used.  We will see each other back in October, sooner if concerns arise  Start time: 3:57pm End time: 4:07p  Total time spent on patient care (including video visit/ documentation): 15 minutes  Mary [name] CHRISTELLA Fielding, DO [facility] [phone]

## 2023-12-07 NOTE — Telephone Encounter (Signed)
 FYI Only or Action Required?: Action required by provider: clinical question for provider.  Patient was last seen in primary care on 10/20/2023 by Joesph Annabella HERO, FNP.  Called Nurse Triage reporting Stress.  Triage Disposition: Call PCP Now                         Patient/caregiver understands and will follow disposition?: Yes Reason for Disposition  [1] Pharmacy calling with prescription question AND [2] triager unable to answer question  Answer Assessment - Initial Assessment Questions Pt states she is moving and states her PCP, Dr. Jolinda, is aware. Pt states her husband passed away and she is trying to sell their house. Pt states it is a tough time Pt is requesting xanax  for her stress levels; pt states she took it previously as she gets wound up Pt states she still has some old xanax  she has taken The move is in middle of Sep per patient  Protocols used: Anxiety and Panic Attack-A-AH, Medication Question Call-A-AH

## 2024-02-17 ENCOUNTER — Encounter: Payer: Self-pay | Admitting: Family Medicine

## 2024-02-17 ENCOUNTER — Ambulatory Visit (INDEPENDENT_AMBULATORY_CARE_PROVIDER_SITE_OTHER): Admitting: Family Medicine

## 2024-02-17 VITALS — BP 130/65 | HR 67 | Temp 98.0°F | Ht 62.0 in | Wt 99.4 lb

## 2024-02-17 DIAGNOSIS — J019 Acute sinusitis, unspecified: Secondary | ICD-10-CM

## 2024-02-17 DIAGNOSIS — F4329 Adjustment disorder with other symptoms: Secondary | ICD-10-CM

## 2024-02-17 DIAGNOSIS — B9689 Other specified bacterial agents as the cause of diseases classified elsewhere: Secondary | ICD-10-CM | POA: Diagnosis not present

## 2024-02-17 DIAGNOSIS — I480 Paroxysmal atrial fibrillation: Secondary | ICD-10-CM

## 2024-02-17 DIAGNOSIS — I10 Essential (primary) hypertension: Secondary | ICD-10-CM | POA: Diagnosis not present

## 2024-02-17 DIAGNOSIS — E782 Mixed hyperlipidemia: Secondary | ICD-10-CM | POA: Diagnosis not present

## 2024-02-17 DIAGNOSIS — F419 Anxiety disorder, unspecified: Secondary | ICD-10-CM | POA: Diagnosis not present

## 2024-02-17 MED ORDER — LORAZEPAM 0.5 MG PO TABS: 0.2500 mg | ORAL_TABLET | Freq: Every day | ORAL | 2 refills | Status: AC | PRN

## 2024-02-17 MED ORDER — IRBESARTAN 300 MG PO TABS: 300.0000 mg | ORAL_TABLET | Freq: Every day | ORAL | 1 refills | Status: DC

## 2024-02-17 MED ORDER — AMOXICILLIN-POT CLAVULANATE 875-125 MG PO TABS: 1.0000 | ORAL_TABLET | Freq: Two times a day (BID) | ORAL | 0 refills | Status: DC

## 2024-02-17 MED ORDER — PRAVASTATIN SODIUM 20 MG PO TABS: 20.0000 mg | ORAL_TABLET | Freq: Every day | ORAL | 1 refills | Status: DC

## 2024-02-17 NOTE — Progress Notes (Signed)
 Subjective: CC:f/u panic PCP: Jolinda Norene HERO, DO YEP:Mary [name] is a 78 y.o. female presenting to clinic today for:  Seen virtually in August for panic attacks.  Was using leftover Xanax  from 2019.  In the setting of Afib, and sparing use, benzo was provided but was replaced with Ativan  due to better safety profile/ shorter half life, though discussed the risky nature of benzodiazepines in general.  She notes that she has done well with the Ativan  and denies any excessive daytime sedation, falls or respiratory depression.  No memory changes.  She takes 1 tablet roughly 3-4 times per week.  She finally did move into her new home.  She has not yet established with a new provider  Also reports sinusitis that has been present for about a 1.5 weeks.  Cough is slightly better but still having a general sensation of not feeling well.  No reports of true fevers but temperature has gone as high as 99 F at home  ROS: Per HPI  Allergies  Allergen Reactions   Fosamax  [Alendronate  Sodium]     Dizziness, elevated blood pressure   Sertraline Nausea Only    Insomnia, did not feel well   Sulfa Antibiotics Nausea Only   Past Medical History:  Diagnosis Date   Age-related osteoporosis without current pathological fracture    Anemia    Anxiety    Arthritis    Cataract    Hypertension    Hyponatremia 08/20/2014   Related to HCTZ, advised by prior MD to stop the HCTZ, she is convinced that she needs it to control her BP Sodium as low as 127 on 06/21/14   Irregular heart beat     Current Outpatient Medications:    amLODipine  (NORVASC ) 5 MG tablet, TAKE 1 TABLET BY MOUTH DAILY, Disp: 100 tablet, Rfl: 3   aspirin  EC 81 MG tablet, Take 1 tablet (81 mg total) by mouth daily. Swallow whole., Disp: 90 tablet, Rfl: 3   Cholecalciferol (VITAMIN D3) 125 MCG (5000 UT) CAPS, Take 5,000 Units by mouth., Disp: , Rfl:    ferrous sulfate 325 (65 FE) MG tablet, Take 325 mg by mouth daily with breakfast.,  Disp: , Rfl:    flecainide  (TAMBOCOR ) 100 MG tablet, TAKE 1 TABLET BY MOUTH TWICE  DAILY, Disp: 200 tablet, Rfl: 3   gabapentin  (NEURONTIN ) 100 MG capsule, Take 1 capsule (100 mg total) by mouth 2 (two) times daily as needed., Disp: 200 capsule, Rfl: 3   irbesartan  (AVAPRO ) 300 MG tablet, TAKE 1 TABLET BY MOUTH DAILY, Disp: 100 tablet, Rfl: 1   LORazepam  (ATIVAN ) 0.5 MG tablet, Take 1 tablet (0.5 mg total) by mouth 2 (two) times daily as needed for anxiety., Disp: 30 tablet, Rfl: 1   metoprolol  succinate (TOPROL -XL) 50 MG 24 hr tablet, TAKE 1 TABLET BY MOUTH DAILY, Disp: 100 tablet, Rfl: 3   Multiple Vitamins-Minerals (MULTIVITAMIN WITH MINERALS) tablet, Take 1 tablet by mouth daily., Disp: , Rfl:    pravastatin  (PRAVACHOL ) 20 MG tablet, TAKE 1 TABLET BY MOUTH DAILY, Disp: 100 tablet, Rfl: 1 Social History   Socioeconomic History   Marital status: Widowed    Spouse name: Not on file   Number of children: 3   Years of education: 26   Highest education level: 12th grade  Occupational History   Occupation: retired  Tobacco Use   Smoking status: Never   Smokeless tobacco: Never  Vaping Use   Vaping status: Never Used  Substance and Sexual Activity  Alcohol use: Yes    Alcohol/week: 7.0 standard drinks of alcohol    Types: 7 Glasses of wine per week   Drug use: No   Sexual activity: Not Currently  Other Topics Concern   Not on file  Social History Narrative   Lives alone. Two sons and a daughter.  Widow.     Social Drivers of Corporate investment banker Strain: Low Risk  (10/14/2023)   Overall Financial Resource Strain (CARDIA)    Difficulty of Paying Living Expenses: Not hard at all  Food Insecurity: No Food Insecurity (10/14/2023)   Hunger Vital Sign    Worried About Running Out of Food in the Last Year: Never true    Ran Out of Food in the Last Year: Never true  Transportation Needs: No Transportation Needs (10/14/2023)   PRAPARE - Administrator, Civil Service  (Medical): No    Lack of Transportation (Non-Medical): No  Physical Activity: Sufficiently Active (10/14/2023)   Exercise Vital Sign    Days of Exercise per Week: 7 days    Minutes of Exercise per Session: 30 min  Stress: No Stress Concern Present (10/14/2023)   [employer] of Occupational Health - Occupational Stress Questionnaire    Feeling of Stress: Not at all  Social Connections: Moderately Integrated (10/14/2023)   Social Connection and Isolation Panel    Frequency of Communication with Friends and Family: More than three times a week    Frequency of Social Gatherings with Friends and Family: Once a week    Attends Religious Services: More than 4 times per year    Active Member of [employer] or Organizations: Yes    Attends Banker Meetings: More than 4 times per year    Marital Status: Widowed  Intimate Partner Violence: Not At Risk (06/01/2023)   Humiliation, Afraid, Rape, and Kick questionnaire    Fear of Current or Ex-Partner: No    Emotionally Abused: No    Physically Abused: No    Sexually Abused: No   Family History  Problem Relation Age of Onset   Arthritis Mother    CAD Mother 91   Hypertension Mother    Hyperlipidemia Mother    Kidney disease Mother    Heart disease Mother    Hypertension Brother    Stroke Maternal Grandmother    Heart disease Maternal Grandfather    Arthritis Paternal Grandmother    Breast cancer Neg Hx     Objective: Office vital signs reviewed. BP 130/65   Pulse 67   Temp 98 F (36.7 C)   Ht 5' 2 (1.575 m)   Wt 99 lb 6 oz (45.1 kg)   SpO2 95%   BMI 18.18 kg/m   Physical Examination:  General: Awake, alert, thin elderly female, No acute distress HEENT: Sclera white.  Moist mucous membranes Cardio: regular rate and rhythm, S1S2 heard, no murmurs appreciated Pulm: clear to auscultation bilaterally, no wheezes, rhonchi or rales; normal work of breathing on room air Neuro: Alert and oriented.     02/17/2024    1:47  PM 10/20/2023    2:30 PM 10/17/2023   12:55 PM  Depression screen PHQ 2/9  Decreased Interest 0 0 0  Down, Depressed, Hopeless 0 0 0  PHQ - 2 Score 0 0 0  Altered sleeping 0 0 0  Tired, decreased energy 0 0 0  Change in appetite 0 0 0  Feeling bad or failure about yourself  0 0 0  Trouble  concentrating 0 0 0  Moving slowly or fidgety/restless 0 0 0  Suicidal thoughts 0 0 0  PHQ-9 Score 0 0 0  Difficult doing work/chores Not difficult at all Not difficult at all Not difficult at all      02/17/2024    1:47 PM 10/20/2023    2:30 PM 10/17/2023   12:54 PM 06/17/2023   12:54 PM  GAD 7 : Generalized Anxiety Score  Nervous, Anxious, on Edge 0 0 0 0  Control/stop worrying 0 0 0 0  Worry too much - different things 0 0 0 0  Trouble relaxing 0 0 0 0  Restless 0 0 0 0  Easily annoyed or irritable 0 0 0 0  Afraid - awful might happen 0 0 0 0  Total GAD 7 Score 0 0 0 0  Anxiety Difficulty Not difficult at all Not difficult at all Not difficult at all Not difficult at all   Assessment/ Plan: 78 y.o. female   Acute bacterial sinusitis - Plan: amoxicillin -clavulanate (AUGMENTIN ) 875-125 MG tablet  Essential hypertension - Plan: irbesartan  (AVAPRO ) 300 MG tablet  Grief reaction with prolonged bereavement - Plan: LORazepam  (ATIVAN ) 0.5 MG tablet, ToxASSURE Select 13 (MW), Urine  Anxiety - Plan: LORazepam  (ATIVAN ) 0.5 MG tablet, ToxASSURE Select 13 (MW), Urine  Paroxysmal atrial fibrillation (HCC) - Plan: LORazepam  (ATIVAN ) 0.5 MG tablet  Mixed hyperlipidemia - Plan: pravastatin  (PRAVACHOL ) 20 MG tablet   Augmentin  sent as a pocket prescription.  We discussed indications for use.  For now continue current regimen for suspected viral infection  Blood pressure is controlled.  No changes needed  Not due for fasting lipid.  Pravachol  renewed  Ativan  renewed.  UDS and CSA were updated as per office policy and the national narcotic database reviewed with no red flags.  Again we discussed the  risk of benzodiazepine use including falls, fracture, death and dementia.  Given the history of hyponatremia with SSRIs and risk of QTc prolongation with alternative agents, it is felt that this is the least risky option for her at this time.  Reinforced need to continue using sparingly.  Plan for MMSE at next visit, sooner if concerns arise   Norene CHRISTELLA Fielding, DO [facility] [phone]

## 2024-02-21 LAB — TOXASSURE SELECT 13 (MW), URINE

## 2024-04-04 ENCOUNTER — Other Ambulatory Visit: Payer: Self-pay | Admitting: Family Medicine

## 2024-04-04 DIAGNOSIS — E782 Mixed hyperlipidemia: Secondary | ICD-10-CM

## 2024-04-29 ENCOUNTER — Other Ambulatory Visit: Payer: Self-pay | Admitting: Family Medicine

## 2024-04-29 DIAGNOSIS — I10 Essential (primary) hypertension: Secondary | ICD-10-CM

## 2024-05-14 ENCOUNTER — Telehealth: Payer: Self-pay | Admitting: Family Medicine

## 2024-05-14 DIAGNOSIS — I480 Paroxysmal atrial fibrillation: Secondary | ICD-10-CM

## 2024-05-14 NOTE — Telephone Encounter (Signed)
 Copied from CRM #[id number]. Topic: Referral - Request for Referral >> May 14, 2024  9:11 AM Alfonso ORN wrote: Did the patient discuss referral with their provider in the last year? Yes (If No - schedule appointment) (If Yes - send message)  Appointment offered? Yes  Type of order/referral and detailed reason for visit: patient learn that her insurance now require anytime patient see a specialty doctor she has to get a referral from her primary care provider  Patient do have an appointment already set up with the heart doctor 05/23/24  Preference of office, provider, location: Dr .Lavona , cardiologist  [address] , [place]   [phone]  Phone number : [phone]  If referral order, have you been seen by this specialty before? Yes (If Yes, this issue or another issue? When? Where? Patient seen Dr. Lavona in [place]   Can we respond through MyChart? No

## 2024-05-14 NOTE — Telephone Encounter (Signed)
Referral placed and pt is aware. 

## 2024-05-22 DIAGNOSIS — I440 Atrioventricular block, first degree: Secondary | ICD-10-CM | POA: Insufficient documentation

## 2024-05-22 NOTE — Progress Notes (Unsigned)
" °  Cardiology Office Note:   Date:  05/23/2024  ID:  Mary [name], DOB 09/06/1945, MRN [id number] PCP: Jolinda Norene HERO, DO  [facility] Providers Cardiologist:  Lynwood Schilling, MD {  History of Present Illness:   Mary [name] is a 79 y.o. female who presents for evaluation of syncope.    She has a history of atrial fib.  This has been paroxysmal and is treated with flecainide .   She had a monitor and had no arrhythmias.  I was going to order a POET (Plain Old Exercise Treadmill).  However, she could not do this because of orthopedic issues.  Since I last saw her she has moved from [facility] to [facility].  She loves it.  [place] The patient denies any new symptoms such as chest discomfort, neck or arm discomfort. There has been no new shortness of breath, PND or orthopnea. There have been no reported palpitations, presyncope or syncope.   ROS: As stated in the HPI and negative for all other systems.  Studies Reviewed:    EKG:   EKG Interpretation Date/Time:  Wednesday May 23 2024 11:27:39 EST Ventricular Rate:  62 PR Interval:  240 QRS Duration:  94 QT Interval:  438 QTC Calculation: 444 R Axis:   46  Text Interpretation: Sinus rhythm with 1st degree A-V block Poor anterior R wave progression When compared with ECG of 03-May-2023 15:50, No significant change since last tracing Confirmed by Schilling Rattan ([id number]) on 05/23/2024 11:34:19 AM    Risk Assessment/Calculations:              Physical Exam:   VS:  BP 120/60 (BP Location: Left Arm, Patient Position: Sitting, Cuff Size: Normal)   Pulse 62   Ht 5' 2 (1.575 m)   Wt 104 lb (47.2 kg)   BMI 19.02 kg/m    Wt Readings from Last 3 Encounters:  05/23/24 104 lb (47.2 kg)  02/17/24 99 lb 6 oz (45.1 kg)  10/20/23 102 lb 9.6 oz (46.5 kg)     GEN: Well nourished, well developed in no acute distress NECK: No JVD; No carotid bruits CARDIAC: RRR, no  murmurs, rubs, gallops RESPIRATORY:  Clear to auscultation without rales, wheezing or rhonchi  ABDOMEN: Soft, non-tender, non-distended EXTREMITIES:  No edema; No deformity   ASSESSMENT AND PLAN:   PAF:    The patient is not having paroxysms.  She wears a Watch.  She feels all of her palpitations when she has been in this rhythm before.  She thinks she is well-maintained on the meds as listed.  She is up-to-date with blood work.  We had long conversations about what I believe is a low risk for thromboembolism given the fact that she is not having paroxysms and we think it is controlled.  I agree with not starting a DOAC.   HTN:    Her blood pressure is at target.  No change in therapy.   This happened several years ago and she has had no further syncope.SYNCOPE:      FIRST DEGREE AV BLOCK:   She has no bradycardic symptoms.  No change in therapy.  Follow up with me in one year.   Signed, Lynwood Schilling, MD   "

## 2024-05-23 ENCOUNTER — Ambulatory Visit: Attending: Cardiology | Admitting: Cardiology

## 2024-05-23 ENCOUNTER — Encounter: Payer: Self-pay | Admitting: Cardiology

## 2024-05-23 VITALS — BP 120/60 | HR 62 | Ht 62.0 in | Wt 104.0 lb

## 2024-05-23 DIAGNOSIS — R55 Syncope and collapse: Secondary | ICD-10-CM | POA: Diagnosis not present

## 2024-05-23 DIAGNOSIS — I440 Atrioventricular block, first degree: Secondary | ICD-10-CM | POA: Diagnosis not present

## 2024-05-23 DIAGNOSIS — I480 Paroxysmal atrial fibrillation: Secondary | ICD-10-CM | POA: Diagnosis not present

## 2024-05-23 NOTE — Patient Instructions (Signed)
 Follow-Up: At [facility], you and your health needs are our priority.  As part of our continuing mission to provide you with exceptional heart care, our providers are all part of one team.  This team includes your primary Cardiologist (physician) and Advanced Practice Providers or APPs (Physician Assistants and Nurse Practitioners) who all work together to provide you with the care you need, when you need it.  Your next appointment:   1 year(s)  Provider:   Eilleen Grates, MD

## 2024-06-01 ENCOUNTER — Ambulatory Visit: Payer: Medicare Other

## 2024-06-22 ENCOUNTER — Ambulatory Visit: Payer: Self-pay | Admitting: Family Medicine
